# Patient Record
Sex: Female | Born: 1940 | Race: White | Hispanic: No | Marital: Married | Smoking: Never smoker
Health system: Southern US, Community
[De-identification: ages and names within clinical notes are randomized; demographics above are authoritative.]

## PROBLEM LIST (undated history)

## (undated) DIAGNOSIS — K5792 Diverticulitis of intestine, part unspecified, without perforation or abscess without bleeding: Secondary | ICD-10-CM

## (undated) DIAGNOSIS — K222 Esophageal obstruction: Secondary | ICD-10-CM

## (undated) DIAGNOSIS — G459 Transient cerebral ischemic attack, unspecified: Secondary | ICD-10-CM

## (undated) DIAGNOSIS — L0291 Cutaneous abscess, unspecified: Secondary | ICD-10-CM

## (undated) DIAGNOSIS — M26609 Unspecified temporomandibular joint disorder, unspecified side: Secondary | ICD-10-CM

## (undated) DIAGNOSIS — K56609 Unspecified intestinal obstruction, unspecified as to partial versus complete obstruction: Secondary | ICD-10-CM

## (undated) DIAGNOSIS — K2100 Gastro-esophageal reflux disease with esophagitis, without bleeding: Secondary | ICD-10-CM

## (undated) DIAGNOSIS — M5410 Radiculopathy, site unspecified: Secondary | ICD-10-CM

## (undated) DIAGNOSIS — N281 Cyst of kidney, acquired: Secondary | ICD-10-CM

## (undated) DIAGNOSIS — M359 Systemic involvement of connective tissue, unspecified: Secondary | ICD-10-CM

## (undated) DIAGNOSIS — I639 Cerebral infarction, unspecified: Secondary | ICD-10-CM

## (undated) DIAGNOSIS — M4802 Spinal stenosis, cervical region: Secondary | ICD-10-CM

## (undated) DIAGNOSIS — E785 Hyperlipidemia, unspecified: Secondary | ICD-10-CM

## (undated) DIAGNOSIS — Z66 Do not resuscitate: Secondary | ICD-10-CM

## (undated) DIAGNOSIS — I10 Essential (primary) hypertension: Secondary | ICD-10-CM

## (undated) DIAGNOSIS — I712 Thoracic aortic aneurysm, without rupture, unspecified: Secondary | ICD-10-CM

## (undated) DIAGNOSIS — Z973 Presence of spectacles and contact lenses: Secondary | ICD-10-CM

## (undated) DIAGNOSIS — I700 Atherosclerosis of aorta: Secondary | ICD-10-CM

## (undated) DIAGNOSIS — N321 Vesicointestinal fistula: Secondary | ICD-10-CM

## (undated) DIAGNOSIS — R911 Solitary pulmonary nodule: Secondary | ICD-10-CM

## (undated) DIAGNOSIS — Z8719 Personal history of other diseases of the digestive system: Secondary | ICD-10-CM

## (undated) DIAGNOSIS — R1310 Dysphagia, unspecified: Secondary | ICD-10-CM

## (undated) DIAGNOSIS — K219 Gastro-esophageal reflux disease without esophagitis: Secondary | ICD-10-CM

## (undated) DIAGNOSIS — M4800 Spinal stenosis, site unspecified: Secondary | ICD-10-CM

## (undated) DIAGNOSIS — M1990 Unspecified osteoarthritis, unspecified site: Secondary | ICD-10-CM

## (undated) DIAGNOSIS — T7840XA Allergy, unspecified, initial encounter: Secondary | ICD-10-CM

## (undated) HISTORY — DX: Solitary pulmonary nodule: R91.1

## (undated) HISTORY — PX: DILATION AND CURETTAGE OF UTERUS: SHX78

## (undated) HISTORY — DX: Unspecified temporomandibular joint disorder, unspecified side: M26.609

## (undated) HISTORY — DX: Do not resuscitate: Z66

## (undated) HISTORY — DX: Spinal stenosis, cervical region: M48.02

## (undated) HISTORY — DX: Gastro-esophageal reflux disease with esophagitis: K21.0

## (undated) HISTORY — PX: KNEE ARTHROSCOPY: SHX127

## (undated) HISTORY — DX: Atherosclerosis of aorta: I70.0

## (undated) HISTORY — DX: Allergy, unspecified, initial encounter: T78.40XA

## (undated) HISTORY — PX: CATARACT EXTRACTION: SUR2

## (undated) HISTORY — PX: SHOULDER SURGERY: SHX246

## (undated) HISTORY — PX: CARDIOVASCULAR STRESS TEST: SHX262

## (undated) HISTORY — DX: Gastro-esophageal reflux disease with esophagitis, without bleeding: K21.00

---

## 1999-10-22 ENCOUNTER — Other Ambulatory Visit: Admission: RE | Admit: 1999-10-22 | Discharge: 1999-10-22 | Payer: Self-pay | Admitting: Gynecology

## 1999-11-19 ENCOUNTER — Encounter (INDEPENDENT_AMBULATORY_CARE_PROVIDER_SITE_OTHER): Payer: Self-pay | Admitting: Specialist

## 1999-11-19 ENCOUNTER — Other Ambulatory Visit: Admission: RE | Admit: 1999-11-19 | Discharge: 1999-11-19 | Payer: Self-pay | Admitting: Gynecology

## 2001-03-01 ENCOUNTER — Other Ambulatory Visit: Admission: RE | Admit: 2001-03-01 | Discharge: 2001-03-01 | Payer: Self-pay | Admitting: Gynecology

## 2002-02-28 ENCOUNTER — Encounter: Payer: Self-pay | Admitting: Rheumatology

## 2002-02-28 ENCOUNTER — Encounter: Admission: RE | Admit: 2002-02-28 | Discharge: 2002-02-28 | Payer: Self-pay | Admitting: Rheumatology

## 2002-11-28 ENCOUNTER — Encounter: Payer: Self-pay | Admitting: Orthopedic Surgery

## 2002-11-28 ENCOUNTER — Ambulatory Visit (HOSPITAL_COMMUNITY): Admission: RE | Admit: 2002-11-28 | Discharge: 2002-11-28 | Payer: Self-pay | Admitting: Orthopedic Surgery

## 2003-02-07 ENCOUNTER — Inpatient Hospital Stay (HOSPITAL_COMMUNITY): Admission: RE | Admit: 2003-02-07 | Discharge: 2003-02-08 | Payer: Self-pay | Admitting: Orthopedic Surgery

## 2008-10-10 HISTORY — PX: ESOPHAGEAL DILATION: SHX303

## 2009-10-19 ENCOUNTER — Ambulatory Visit: Payer: Self-pay | Admitting: Vascular Surgery

## 2009-10-19 ENCOUNTER — Encounter (INDEPENDENT_AMBULATORY_CARE_PROVIDER_SITE_OTHER): Payer: Self-pay | Admitting: Orthopedic Surgery

## 2009-10-19 ENCOUNTER — Ambulatory Visit: Admission: RE | Admit: 2009-10-19 | Discharge: 2009-10-19 | Payer: Self-pay | Admitting: Orthopedic Surgery

## 2009-11-07 ENCOUNTER — Ambulatory Visit (HOSPITAL_COMMUNITY): Admission: RE | Admit: 2009-11-07 | Discharge: 2009-11-07 | Payer: Self-pay | Admitting: Orthopedic Surgery

## 2010-05-09 ENCOUNTER — Encounter
Admission: RE | Admit: 2010-05-09 | Discharge: 2010-05-09 | Payer: Self-pay | Source: Home / Self Care | Attending: Family Medicine | Admitting: Family Medicine

## 2010-07-16 ENCOUNTER — Ambulatory Visit (HOSPITAL_BASED_OUTPATIENT_CLINIC_OR_DEPARTMENT_OTHER)
Admission: RE | Admit: 2010-07-16 | Discharge: 2010-07-16 | Disposition: A | Discharge status: Home / Self Care | Payer: MEDICARE | Source: Ambulatory Visit | Attending: Orthopedic Surgery | Admitting: Orthopedic Surgery

## 2010-07-16 DIAGNOSIS — Z79899 Other long term (current) drug therapy: Secondary | ICD-10-CM | POA: Insufficient documentation

## 2010-07-16 DIAGNOSIS — M659 Unspecified synovitis and tenosynovitis, unspecified site: Secondary | ICD-10-CM | POA: Insufficient documentation

## 2010-07-16 DIAGNOSIS — X58XXXA Exposure to other specified factors, initial encounter: Secondary | ICD-10-CM | POA: Insufficient documentation

## 2010-07-16 DIAGNOSIS — R0789 Other chest pain: Secondary | ICD-10-CM | POA: Insufficient documentation

## 2010-07-16 DIAGNOSIS — M1710 Unilateral primary osteoarthritis, unspecified knee: Secondary | ICD-10-CM | POA: Insufficient documentation

## 2010-07-16 DIAGNOSIS — IMO0002 Reserved for concepts with insufficient information to code with codable children: Secondary | ICD-10-CM | POA: Insufficient documentation

## 2010-07-16 DIAGNOSIS — Z8673 Personal history of transient ischemic attack (TIA), and cerebral infarction without residual deficits: Secondary | ICD-10-CM | POA: Insufficient documentation

## 2010-07-16 DIAGNOSIS — Z7982 Long term (current) use of aspirin: Secondary | ICD-10-CM | POA: Insufficient documentation

## 2010-07-17 NOTE — Op Note (Signed)
NAME:  Yvonne Espinoza, Yvonne Espinoza NO.:  [id number]  MEDICAL RECORD NO.:  [id number]            PATIENT TYPE:  LOCATION:                                 FACILITY:  PHYSICIAN:  Georges Lynch. Debbera Wolken, M.D.DATE OF BIRTH:  1941/03/10  DATE OF PROCEDURE:  07/16/2010 DATE OF DISCHARGE:                              OPERATIVE REPORT   SURGEON:  Georges Lynch. Darrelyn Hillock, M.D.  ASSISTANT:  Nurse.  PREOPERATIVE DIAGNOSES: 1. Torn medial meniscus right knee. 2. Degenerative arthritis right knee.  POSTOPERATIVE DIAGNOSES: 1. Torn medial meniscus right knee. 2. Degenerative arthritis right knee.  OPERATIONS: 1. Diagnostic arthroscopy, right knee. 2. Medial meniscectomy, right knee. 3. Abrasion chondroplasty medial femoral condyle, right knee. 4. Abrasion chondroplasty lateral femoral condyle, right knee. 5. Synovectomy suprapatellar pouch, right knee.  DESCRIPTION OF PROCEDURE:  Under general anesthesia, routine orthopedic prep and draping of the right lower extremity was carried out.  She had 1 g IV Ancef.  The appropriate time-out was carried out prior to making the incisions.  Prior to her coming into the operating room in the holding area, marked the appropriate right leg.  After the sterile prep and draping in the operating room, I did a small punctate incision in suprapatellar pouch.  Inflow cannula was inserted.  Knee was distended with saline.  Another small punctate incision made in the lateral joint. The arthroscope was entered from lateral approach, and a complete diagnostic arthroscopy was carried out.  Following that, I went up in the suprapatellar pouch.  She had a severe synovitis.  The patellofemoral joint looked fine.  I introduced the ArthroCare and did a synovectomy.  I then went down in the lateral joint.  The lateral meniscus had some very small peripheral tears.  No meniscectomy was necessary.  Did have a chondral defect with a small piece of cartilage literally  hanging off the condyle, simply did an abrasion chondroplasty not down to bleeding bone.  Cruciates were intact.  I went over the medial joint where the highlighted main problem was.  She had a complex tear of medial meniscus.  I did a partial medial meniscectomy.  There were no other abnormalities of the meniscus.  I then did an abrasion chondroplasty in the medial femoral condyle because of the arthritic changes.  No microfracture technique was necessary.  I thoroughly irrigated out the knee, removed all the fluid, closed all 3 punctate incisions with 3-0 nylon suture.  I injected 30 mL of 0.25% Marcaine with epinephrine in the knee joint, and a sterile dressing was applied. No Neosporin was used since she is allergic NEOSPORIN.  POSTOPERATIVE INSTRUCTIONS: 1. She will be on aspirin 325 mg b.i.d. for 2 weeks starting today the     day of surgery. 2. She will be on a walker partial to full weightbearing as tolerated. 3. She will be on Percocet 10/650 one every 4 hours p.r.n. for pain. 4. I will see her in 10-12 days in the office for a followup or before     that if presenting problem.          ______________________________ Georges Lynch Darrelyn Hillock, M.D.  RAG/MEDQ  D:  07/16/2010  T:  07/16/2010  Job:  [id number]  Electronically Signed by Ranee Gosselin M.D. on 07/17/2010 08:29:55 AM

## 2010-07-18 LAB — POCT HEMOGLOBIN-HEMACUE: Hemoglobin: 15.7 g/dL — ABNORMAL HIGH (ref 12.0–15.0)

## 2010-07-21 LAB — COMPREHENSIVE METABOLIC PANEL
ALT: 22 U/L (ref 0–35)
AST: 20 U/L (ref 0–37)
Albumin: 3.5 g/dL (ref 3.5–5.2)
Alkaline Phosphatase: 83 U/L (ref 39–117)
Chloride: 106 mEq/L (ref 96–112)
GFR calc Af Amer: >60 mL/min (ref >60)
Potassium: 4.7 mEq/L (ref 3.5–5.1)
Total Bilirubin: 0.7 mg/dL (ref 0.3–1.2)

## 2010-07-21 LAB — URINALYSIS, ROUTINE W REFLEX MICROSCOPIC
Bilirubin Urine: NEGATIVE
Glucose, UA: NEGATIVE mg/dL
Ketones, ur: NEGATIVE mg/dL
pH: 5.5 (ref 5.0–8.0)

## 2010-07-21 LAB — CBC
HCT: 42.8 % (ref 36.0–46.0)
Hemoglobin: 14.7 g/dL (ref 12.0–15.0)
MCH: 31 pg (ref 26.0–34.0)
MCHC: 34.4 g/dL (ref 30.0–36.0)
MCV: 90.2 fL (ref 78.0–100.0)
Platelets: 316 10*3/uL (ref 150–400)
RBC: 4.74 MIL/uL (ref 3.87–5.11)
RDW: 14.8 % (ref 11.5–15.5)
WBC: 8.5 10*3/uL (ref 4.0–10.5)

## 2010-07-21 LAB — DIFFERENTIAL
Basophils Absolute: 0 10*3/uL (ref 0.0–0.1)
Basophils Relative: 0 % (ref 0–1)
Eosinophils Relative: 3 % (ref 0–5)
Monocytes Absolute: 0.7 10*3/uL (ref 0.1–1.0)

## 2010-09-20 NOTE — Op Note (Signed)
   NAME:  Yvonne Espinoza, Yvonne Espinoza                      ACCOUNT NO.:  [id number]   MEDICAL RECORD NO.:  [id number]                   PATIENT TYPE:  OBV   LOCATION:  0446                                 FACILITY:  [facility]   PHYSICIAN:  Georges Lynch. Darrelyn Hillock, M.D.             DATE OF BIRTH:  09-20-1940   DATE OF PROCEDURE:  02/06/2003  DATE OF DISCHARGE:                                 OPERATIVE REPORT   PREOPERATIVE DIAGNOSES:  1. Severe impingement syndrome, right shoulder.  2. Partial tear of rotator cuff tendon, right shoulder.   POSTOPERATIVE DIAGNOSES:  1. Severe impingement syndrome, right shoulder.  2. Partial tear of rotator cuff tendon, right shoulder.   OPERATION:  1. Partial acromionectomy with acromioplasty, right shoulder.  2. Repair of a partial tear of the rotator cuff utilizing some direct     suturing.  3. Excision of the subdeltoid bursa, right shoulder.   SURGEON:  Georges Lynch. Darrelyn Hillock, M.D.   ASSISTANT:  Ebbie Ridge. Paitsel, P.A.   DESCRIPTION OF PROCEDURE:  Under general anesthesia, the patient first had 1  g of IV Ancef.  She also had an interscalene block prior to her general  anesthesia on the right.  Sterile prep and draping was carried out in the  operating room. She had 1 g of IV Ancef.  The incision was made over the  anterior aspect of the right shoulder, bleeders identified and cauterized.  The incision was carried down to the acromion.  By sharp dissection I  separated the tendon from the acromion.  Following this I went down and  partially split the proximal portion of the deltoid muscle.  I inserted the  Bennett retractor and did a partial acromionectomy and an acromioplasty  utilizing an oscillating saw and the bur.  She had severe impingement.  She  had literally caused a marked thinning of her rotator cuff with a small hole  in the cuff.  I used a couple of direct sutures to suture that.  The  subdeltoid bursa was removed.  It was full of fluid.  Thoroughly  irrigated  out the area.  I bone-waxed the raw end of the acromion and then inserted  some Gelfoam.  The wound then was closed in layers in the usual fashion.  The skin was closed with metal staples.  A sterile Neosporin dressing was  applied.  She was placed in a shoulder immobilizer.                                                Ronald A. Darrelyn Hillock, M.D.    RAG/MEDQ  D:  02/06/2003  T:  02/06/2003  Job:  [id number]

## 2011-08-20 ENCOUNTER — Other Ambulatory Visit: Payer: Self-pay | Admitting: Family Medicine

## 2011-08-22 ENCOUNTER — Ambulatory Visit
Admission: RE | Admit: 2011-08-22 | Discharge: 2011-08-22 | Disposition: A | Discharge status: Home / Self Care | Payer: Medicare Other | Source: Ambulatory Visit | Attending: Family Medicine | Admitting: Family Medicine

## 2012-09-02 DIAGNOSIS — M26609 Unspecified temporomandibular joint disorder, unspecified side: Secondary | ICD-10-CM

## 2012-09-02 HISTORY — DX: Unspecified temporomandibular joint disorder, unspecified side: M26.609

## 2014-04-20 ENCOUNTER — Other Ambulatory Visit: Payer: Self-pay | Admitting: Family Medicine

## 2014-04-20 DIAGNOSIS — R509 Fever, unspecified: Secondary | ICD-10-CM

## 2014-04-20 DIAGNOSIS — R1084 Generalized abdominal pain: Secondary | ICD-10-CM

## 2014-04-24 ENCOUNTER — Ambulatory Visit
Admission: RE | Admit: 2014-04-24 | Discharge: 2014-04-24 | Disposition: A | Discharge status: Home / Self Care | Payer: Commercial Managed Care - HMO | Source: Ambulatory Visit | Attending: Family Medicine | Admitting: Family Medicine

## 2014-04-24 DIAGNOSIS — R509 Fever, unspecified: Secondary | ICD-10-CM

## 2014-04-24 DIAGNOSIS — R1084 Generalized abdominal pain: Secondary | ICD-10-CM

## 2014-04-24 MED ORDER — IOHEXOL 300 MG/ML  SOLN
100.0000 mL | Freq: Once | INTRAMUSCULAR | Status: AC | PRN
  Administered 2014-04-24: 100 mL via INTRAVENOUS

## 2014-05-01 ENCOUNTER — Encounter (HOSPITAL_COMMUNITY): Payer: Self-pay | Admitting: *Deleted

## 2014-05-01 ENCOUNTER — Inpatient Hospital Stay (HOSPITAL_COMMUNITY)
Admission: EM | Admit: 2014-05-01 | Discharge: 2014-05-03 | DRG: 392 | Disposition: A | Discharge status: Home / Self Care | Payer: Commercial Managed Care - HMO | Attending: Internal Medicine | Admitting: Internal Medicine

## 2014-05-01 ENCOUNTER — Emergency Department (HOSPITAL_COMMUNITY): Payer: Commercial Managed Care - HMO

## 2014-05-01 DIAGNOSIS — M069 Rheumatoid arthritis, unspecified: Secondary | ICD-10-CM | POA: Diagnosis present

## 2014-05-01 DIAGNOSIS — R109 Unspecified abdominal pain: Secondary | ICD-10-CM | POA: Diagnosis not present

## 2014-05-01 DIAGNOSIS — Z886 Allergy status to analgesic agent status: Secondary | ICD-10-CM | POA: Diagnosis not present

## 2014-05-01 DIAGNOSIS — M62838 Other muscle spasm: Secondary | ICD-10-CM | POA: Diagnosis present

## 2014-05-01 DIAGNOSIS — Z8719 Personal history of other diseases of the digestive system: Secondary | ICD-10-CM

## 2014-05-01 DIAGNOSIS — D649 Anemia, unspecified: Secondary | ICD-10-CM | POA: Diagnosis present

## 2014-05-01 DIAGNOSIS — Z8673 Personal history of transient ischemic attack (TIA), and cerebral infarction without residual deficits: Secondary | ICD-10-CM

## 2014-05-01 DIAGNOSIS — Z888 Allergy status to other drugs, medicaments and biological substances status: Secondary | ICD-10-CM

## 2014-05-01 DIAGNOSIS — M4800 Spinal stenosis, site unspecified: Secondary | ICD-10-CM | POA: Diagnosis present

## 2014-05-01 DIAGNOSIS — K5732 Diverticulitis of large intestine without perforation or abscess without bleeding: Secondary | ICD-10-CM | POA: Diagnosis not present

## 2014-05-01 DIAGNOSIS — K5733 Diverticulitis of large intestine without perforation or abscess with bleeding: Secondary | ICD-10-CM

## 2014-05-01 HISTORY — DX: Spinal stenosis, site unspecified: M48.00

## 2014-05-01 HISTORY — DX: Transient cerebral ischemic attack, unspecified: G45.9

## 2014-05-01 HISTORY — DX: Unspecified osteoarthritis, unspecified site: M19.90

## 2014-05-01 HISTORY — DX: Diverticulitis of intestine, part unspecified, without perforation or abscess without bleeding: K57.92

## 2014-05-01 LAB — COMPREHENSIVE METABOLIC PANEL
ALBUMIN: 3.6 g/dL (ref 3.5–5.2)
ALK PHOS: 68 U/L (ref 39–117)
ALT: 12 U/L (ref 0–35)
ALT: 13 U/L (ref 0–35)
ANION GAP: 9 (ref 5–15)
AST: 19 U/L (ref 0–37)
AST: 19 U/L (ref 0–37)
Albumin: 3.1 g/dL — ABNORMAL LOW (ref 3.5–5.2)
Alkaline Phosphatase: 75 U/L (ref 39–117)
Anion gap: 10 (ref 5–15)
BUN: 10 mg/dL (ref 6–23)
BUN: 8 mg/dL (ref 6–23)
CALCIUM: 9 mg/dL (ref 8.4–10.5)
CO2: 26 mmol/L (ref 19–32)
CO2: 28 mmol/L (ref 19–32)
CREATININE: 0.69 mg/dL (ref 0.50–1.10)
Calcium: 9.1 mg/dL (ref 8.4–10.5)
Chloride: 101 mEq/L (ref 96–112)
Chloride: 100 mEq/L (ref 96–112)
Creatinine, Ser: 0.7 mg/dL (ref 0.50–1.10)
GFR calc Af Amer: >90 mL/min (ref >90)
GFR calc Af Amer: >90 mL/min (ref >90)
GFR calc non Af Amer: 85 mL/min — ABNORMAL LOW (ref >90)
GFR calc non Af Amer: 85 mL/min — ABNORMAL LOW (ref >90)
Glucose, Bld: 110 mg/dL — ABNORMAL HIGH (ref 70–99)
Glucose, Bld: 166 mg/dL — ABNORMAL HIGH (ref 70–99)
POTASSIUM: 4 mmol/L (ref 3.5–5.1)
Potassium: 4 mmol/L (ref 3.5–5.1)
Sodium: 136 mmol/L (ref 135–145)
Sodium: 138 mmol/L (ref 135–145)
TOTAL PROTEIN: 7.3 g/dL (ref 6.0–8.3)
TOTAL PROTEIN: 6.5 g/dL (ref 6.0–8.3)
Total Bilirubin: 0.4 mg/dL (ref 0.3–1.2)
Total Bilirubin: 0.6 mg/dL (ref 0.3–1.2)

## 2014-05-01 LAB — URINE MICROSCOPIC-ADD ON

## 2014-05-01 LAB — URINALYSIS, ROUTINE W REFLEX MICROSCOPIC
Glucose, UA: NEGATIVE mg/dL
Hgb urine dipstick: NEGATIVE
Ketones, ur: NEGATIVE mg/dL
NITRITE: NEGATIVE
Protein, ur: NEGATIVE mg/dL
SPECIFIC GRAVITY, URINE: 1.022 (ref 1.005–1.030)
UROBILINOGEN UA: 0.2 mg/dL (ref 0.0–1.0)
pH: 5 (ref 5.0–8.0)

## 2014-05-01 LAB — CBC WITH DIFFERENTIAL/PLATELET
BASOS ABS: 0.1 10*3/uL (ref 0.0–0.1)
BASOS PCT: 0 % (ref 0–1)
EOS ABS: 0.4 10*3/uL (ref 0.0–0.7)
Eosinophils Relative: 2 % (ref 0–5)
HEMATOCRIT: 35.6 % — AB (ref 36.0–46.0)
HEMOGLOBIN: 11.1 g/dL — AB (ref 12.0–15.0)
Lymphocytes Relative: 9 % — ABNORMAL LOW (ref 12–46)
Lymphs Abs: 1.4 10*3/uL (ref 0.7–4.0)
MCH: 28.1 pg (ref 26.0–34.0)
MCHC: 31.2 g/dL (ref 30.0–36.0)
MCV: 90.1 fL (ref 78.0–100.0)
MONO ABS: 1 10*3/uL (ref 0.1–1.0)
MONOS PCT: 7 % (ref 3–12)
NEUTROS PCT: 82 % — AB (ref 43–77)
Neutro Abs: 11.7 10*3/uL — ABNORMAL HIGH (ref 1.7–7.7)
Platelets: 712 10*3/uL — ABNORMAL HIGH (ref 150–400)
RBC: 3.95 MIL/uL (ref 3.87–5.11)
RDW: 18.7 % — AB (ref 11.5–15.5)
WBC: 14.5 10*3/uL — ABNORMAL HIGH (ref 4.0–10.5)

## 2014-05-01 LAB — TSH: TSH: 1.87 u[IU]/mL (ref 0.350–4.500)

## 2014-05-01 LAB — LIPASE, BLOOD: LIPASE: 19 U/L (ref 11–59)

## 2014-05-01 MED ORDER — ONDANSETRON HCL 4 MG PO TABS: 4.0000 mg | ORAL_TABLET | Freq: Four times a day (QID) | ORAL | Status: DC | PRN

## 2014-05-01 MED ORDER — IOHEXOL 300 MG/ML  SOLN
50.0000 mL | Freq: Once | INTRAMUSCULAR | Status: AC | PRN
  Administered 2014-05-01: 50 mL via ORAL

## 2014-05-01 MED ORDER — HYDROCODONE-ACETAMINOPHEN 5-325 MG PO TABS: 1.0000 | ORAL_TABLET | ORAL | Status: DC | PRN

## 2014-05-01 MED ORDER — MORPHINE SULFATE 2 MG/ML IJ SOLN: 2.0000 mg | INTRAMUSCULAR | Status: DC | PRN

## 2014-05-01 MED ORDER — FOLIC ACID 1 MG PO TABS: 1.0000 mg | ORAL_TABLET | Freq: Three times a day (TID) | ORAL | Status: DC

## 2014-05-01 MED ORDER — SODIUM CHLORIDE 0.9 % IV SOLN
3.0000 g | Freq: Four times a day (QID) | INTRAVENOUS | Status: DC
  Administered 2014-05-01 – 2014-05-03 (×6): 3 g via INTRAVENOUS
  Filled 2014-05-01 (×7): qty 3

## 2014-05-01 MED ORDER — ZOLPIDEM TARTRATE 5 MG PO TABS: 5.0000 mg | ORAL_TABLET | Freq: Every evening | ORAL | Status: DC | PRN

## 2014-05-01 MED ORDER — ADULT MULTIVITAMIN W/MINERALS CH
1.0000 | ORAL_TABLET | Freq: Every day | ORAL | Status: DC
  Filled 2014-05-01 (×2): qty 1

## 2014-05-01 MED ORDER — METHOTREXATE SODIUM CHEMO INJECTION 25 MG/ML: 20.0000 mg | INTRAMUSCULAR | Status: DC

## 2014-05-01 MED ORDER — IOHEXOL 300 MG/ML  SOLN
100.0000 mL | Freq: Once | INTRAMUSCULAR | Status: AC | PRN
  Administered 2014-05-01: 100 mL via INTRAVENOUS

## 2014-05-01 MED ORDER — FOLIC ACID 1 MG PO TABS
1.0000 mg | ORAL_TABLET | Freq: Three times a day (TID) | ORAL | Status: DC
  Administered 2014-05-01 – 2014-05-03 (×5): 1 mg via ORAL
  Filled 2014-05-01 (×9): qty 1

## 2014-05-01 MED ORDER — PANTOPRAZOLE SODIUM 40 MG PO TBEC
40.0000 mg | DELAYED_RELEASE_TABLET | Freq: Every day | ORAL | Status: DC
  Filled 2014-05-01 (×2): qty 1

## 2014-05-01 MED ORDER — GARLIC 1000 MG PO CAPS: 1000.0000 mg | ORAL_CAPSULE | Freq: Every day | ORAL | Status: DC

## 2014-05-01 MED ORDER — VITAMIN B-1 100 MG PO TABS
100.0000 mg | ORAL_TABLET | Freq: Every day | ORAL | Status: DC
  Filled 2014-05-01 (×2): qty 1

## 2014-05-01 MED ORDER — ONDANSETRON HCL 4 MG/2ML IJ SOLN: 4.0000 mg | Freq: Four times a day (QID) | INTRAMUSCULAR | Status: DC | PRN

## 2014-05-01 MED ORDER — METHOTREXATE (PF) 10 MG/0.4ML ~~LOC~~ SOAJ: 0.8000 mL | SUBCUTANEOUS | Status: DC

## 2014-05-01 MED ORDER — SODIUM CHLORIDE 0.9 % IV SOLN
INTRAVENOUS | Status: DC
  Administered 2014-05-01 – 2014-05-03 (×2): via INTRAVENOUS

## 2014-05-01 MED ORDER — SODIUM CHLORIDE 0.9 % IV SOLN
3.0000 g | Freq: Four times a day (QID) | INTRAVENOUS | Status: DC
  Filled 2014-05-01 (×2): qty 3

## 2014-05-01 NOTE — Progress Notes (Signed)
Utilization Review completed.  Ronniesha Seibold RN CM  

## 2014-05-01 NOTE — Progress Notes (Signed)
PHARMACIST - PHYSICIAN ORDER COMMUNICATION  CONCERNING: P&T Medication Policy on Herbal Medications  DESCRIPTION:  This patient's order for:  garlic  has been noted.  This product(s) is classified as an "herbal" or natural product. Due to a lack of definitive safety studies or FDA approval, nonstandard manufacturing practices, plus the potential risk of unknown drug-drug interactions while on inpatient medications, the Pharmacy and Therapeutics Committee does not permit the use of "herbal" or natural products of this type within [facility].   ACTION TAKEN: The pharmacy department is unable to verify this order at this time and your patient has been informed of this safety policy. Please reevaluate patient's clinical condition at discharge and address if the herbal or natural product(s) should be resumed at that time.  Netta Cedars, PharmD, BCPS [phone]

## 2014-05-01 NOTE — ED Notes (Signed)
Patient has finished her CT contrast

## 2014-05-01 NOTE — ED Provider Notes (Signed)
CSN: [id number]     Arrival date & time 05/01/14  1238 History   First MD Initiated Contact with Patient 05/01/14 1501     Chief Complaint  Patient presents with  . diverticulitis   . Abdominal Pain     (Consider location/radiation/quality/duration/timing/severity/associated sxs/prior Treatment) Patient is a 73 y.o. female presenting with abdominal pain. The history is provided by the patient.  Abdominal Pain Pain location:  LLQ and RLQ Pain quality: aching and sharp   Pain radiates to:  Does not radiate Pain severity:  Moderate Onset quality:  Gradual Timing:  Intermittent Progression:  Unchanged Chronicity:  New Context: recent illness (recent diverticulitis)   Context: not eating and not recent travel   Relieved by:  Nothing Worsened by:  Nothing tried Ineffective treatments:  None tried Associated symptoms: chills, diarrhea (with mucus in her stools) and fever   Associated symptoms: no cough, no nausea, no shortness of breath and no vomiting     Past Medical History  Diagnosis Date  . TIA (transient ischemic attack)     2209,2010  . Spinal stenosis   . Diverticulitis    Past Surgical History  Procedure Laterality Date  . Shoulder surgery      right 2010   History reviewed. No pertinent family history. History  Substance Use Topics  . Smoking status: Never Smoker   . Smokeless tobacco: Not on file  . Alcohol Use: No   OB History    No data available     Review of Systems  Constitutional: Positive for fever and chills.  Respiratory: Negative for cough and shortness of breath.   Gastrointestinal: Positive for abdominal pain and diarrhea (with mucus in her stools). Negative for nausea and vomiting.  All other systems reviewed and are negative.     Allergies  Neosporin  Home Medications   Prior to Admission medications   Medication Sig Start Date End Date Taking? Authorizing Provider  BIOTIN PO Take 10,000 mg by mouth daily.   Yes Historical  Provider, MD  ciprofloxacin (CIPRO) 500 MG tablet Take 500 mg by mouth 2 (two) times daily.   Yes Historical Provider, MD  folic acid (FOLVITE) 1 MG tablet Take 1 mg by mouth 3 (three) times daily.   Yes Historical Provider, MD  Garlic 1000 MG CAPS Take 1,000 mg by mouth daily.   Yes Historical Provider, MD  Methotrexate, Anti-Rheumatic, (METHOTREXATE, PF, Avis) Inject 0.8 mLs into the skin once a week. Take injection Every Friday.   Yes Historical Provider, MD  metroNIDAZOLE (FLAGYL) 500 MG tablet Take 500 mg by mouth 2 (two) times daily.   Yes Historical Provider, MD  Multiple Vitamins-Minerals (WOMENS MULTIVITAMIN PLUS PO) Take 1 tablet by mouth daily.   Yes Historical Provider, MD  omeprazole (PRILOSEC) 20 MG capsule Take 20 mg by mouth daily.   Yes Historical Provider, MD  OVER THE COUNTER MEDICATION Take 1 capsule by mouth daily. Co Q-10 & Cinnamon [id number] mg capsule.   Yes Historical Provider, MD  OVER THE COUNTER MEDICATION Take 1 capsule by mouth daily. Lutigold 20 mg capsule.   Yes Historical Provider, MD   BP 122/49 mmHg  Pulse 103  Temp(Src) 97.7 F (36.5 C) (Oral)  Resp 16  SpO2 100% Physical Exam  Constitutional: She is oriented to person, place, and time. She appears well-developed and well-nourished. No distress.  HENT:  Head: Normocephalic and atraumatic.  Mouth/Throat: Oropharynx is clear and moist. No oropharyngeal exudate.  Eyes: EOM are normal. Pupils are  equal, round, and reactive to light.  Neck: Normal range of motion. Neck supple.  Cardiovascular: Normal rate and regular rhythm.  Exam reveals no friction rub.   No murmur heard. Pulmonary/Chest: Effort normal and breath sounds normal. No respiratory distress. She has no wheezes. She has no rales.  Abdominal: Soft. She exhibits no distension. There is tenderness (bilateral lower quadrants). There is guarding (bilateral lower quadrants). There is no rebound.  Musculoskeletal: Normal range of motion. She exhibits no  edema.  Neurological: She is alert and oriented to person, place, and time.  Skin: No rash noted. She is not diaphoretic.  Nursing note and vitals reviewed.   ED Course  Procedures (including critical care time) Labs Review Labs Reviewed  CBC WITH DIFFERENTIAL - Abnormal; Notable for the following:    WBC 14.5 (*)    Hemoglobin 11.1 (*)    HCT 35.6 (*)    RDW 18.7 (*)    Platelets 712 (*)    Neutrophils Relative % 82 (*)    Neutro Abs 11.7 (*)    Lymphocytes Relative 9 (*)    All other components within normal limits  COMPREHENSIVE METABOLIC PANEL - Abnormal; Notable for the following:    Glucose, Bld 110 (*)    GFR calc non Af Amer 85 (*)    All other components within normal limits  URINALYSIS, ROUTINE W REFLEX MICROSCOPIC - Abnormal; Notable for the following:    Color, Urine AMBER (*)    Bilirubin Urine SMALL (*)    Leukocytes, UA SMALL (*)    All other components within normal limits  URINE MICROSCOPIC-ADD ON - Abnormal; Notable for the following:    Bacteria, UA FEW (*)    All other components within normal limits  LIPASE, BLOOD    Imaging Review Ct Abdomen Pelvis W Contrast  05/01/2014   CLINICAL DATA:  Diffuse abdominal pain for 1 year. Follow-up diverticulitis with microperforation. Persistent fevers and chills.  EXAM: CT ABDOMEN AND PELVIS WITH CONTRAST  TECHNIQUE: Multidetector CT imaging of the abdomen and pelvis was performed using the standard protocol following bolus administration of intravenous contrast.  CONTRAST:  OMNIPAQUE IOHEXOL 300 MG/ML  SOLN  COMPARISON:  04/24/2014  FINDINGS: Lower Chest:  Unremarkable.  Hepatobiliary: Stable tiny left hepatic lobe cyst. No masses or other significant abnormality identified.  Pancreas: No mass, inflammatory changes, or other parenchymal abnormality identified.  Spleen:  Within normal limits in size and appearance.  Adrenal Glands:  No mass identified.  Kidneys/Urinary Tract: No masses identified. Stable bilateral  parapelvic cysts. No evidence of hydronephrosis.  Stomach/Bowel/Peritoneum: Moderate to severe diverticulitis involving the sigmoid colon is again seen without significant change. Inflammatory changes are again seen in the pericolonic fat and central sigmoid mesocolon, however no definite extraluminal air bubbles are seen on today's study. There is no evidence of abscess or free fluid. No evidence of bowel obstruction.  Vascular/Lymphatic: No pathologically enlarged lymph nodes identified. No other significant abnormality identified.  Reproductive:  No mass or other significant abnormality identified.  Other:  None.  Musculoskeletal:  No suspicious bone lesions identified.  IMPRESSION: Persistent moderate to severe sigmoid diverticulitis. No extraluminal air, abscess, or other complication identified on today's exam.   Electronically Signed   By: Myles Rosenthal M.D.   On: 05/01/2014 17:36     EKG Interpretation None      MDM   Final diagnoses:  Abdominal pain  Diverticulitis of large intestine without perforation or abscess without bleeding  8F here with fever, chills. Seen at [facility] today, sent for further evaluation. Recently diagnosed with diverticulitis, hx of same over past 1.5 years. Has been taking Cipro/Flagyl. Despite antibiotics, persistent fevers, intermittent abdominal pain. Also having mucus in her stools and some diarrhea.  Last fever 2 days ago.  Records show WBC of 12 and CT imaging states moderate diverticulitis with microperforation from 04/22/14. Here AFVSS. Lower abodminal pain with guarding. Plan for repeat CT scan. Concern for possible abscess. CT shows persistent diverticulitis. Since failing outpatient therapy, admitted for antibiotics.   Elwin Mocha, MD 05/02/14 [phone]

## 2014-05-01 NOTE — ED Notes (Signed)
Pt sent from pcp, pt has had ongoing abdominal pain/ issues x1 year. Had colonoscopy 3 weeks ago with no results. Had CT scan on 12/21 which shows "recent diverticulitis with microperforation". Ongoing fevers. Abdominal pain 6/10. pcp was trying to get pt into with a surgeon, surgeon told pt to come to ED if she was in a lot of pain. Pt reports she is not in a lot of pain, her biggest concern is why she keeps having fevers and chills.

## 2014-05-01 NOTE — Progress Notes (Signed)
ANTIBIOTIC CONSULT NOTE - INITIAL  Pharmacy Consult for Unasyn Indication: Diverticulitis  Allergies  Allergen Reactions  . Gabapentin Other (See Comments)    "every side effect listed on pamphlet"  . Neosporin [Neomycin-Bacitracin Zn-Polymyx]   . Prednisone Other (See Comments)    Tachycardia/mood swings    Patient Measurements: Height: 5' (152.4 cm) Weight: 119 lb 11.4 oz (54.3 kg) IBW/kg (Calculated) : 45.5 Adjusted Body Weight:   Vital Signs: Temp: 98.7 F (37.1 C) (12/28 2002) Temp Source: Oral (12/28 2002) BP: 129/49 mmHg (12/28 2002) Pulse Rate: 89 (12/28 2002) Intake/Output from previous day:   Intake/Output from this shift:    Labs:  Recent Labs  05/01/14 1324  WBC 14.5*  HGB 11.1*  PLT 712*  CREATININE 0.69   Estimated Creatinine Clearance: 45.7 mL/min (by C-G formula based on Cr of 0.69). No results for input(s): VANCOTROUGH, VANCOPEAK, VANCORANDOM, GENTTROUGH, GENTPEAK, GENTRANDOM, TOBRATROUGH, TOBRAPEAK, TOBRARND, AMIKACINPEAK, AMIKACINTROU, AMIKACIN in the last 72 hours.   Microbiology: No results found for this or any previous visit (from the past 720 hour(s)).  Medical History: Past Medical History  Diagnosis Date  . TIA (transient ischemic attack)     2209,2010  . Spinal stenosis   . Diverticulitis   . Arthritis     Assessment: 20 yoF presents with abdominal pain, fever, and chills, recently CT 12/19 showing moderate diverticulitis with microperforation. Pharmacy consulted to start Unasyn for possible abscess / diverticulitis.  Plan for repeat CT scan.    12/28 >> unasyn  >>  Tmax: AF WBCs: Elevated 14.5K Renal: SCr 0.69, CrCl 46 ml/min  Goal of Therapy:  Eradication of infection  Plan:  Start unasyn 3g IV q6h.  F/u renal fxn, clinical course  Haynes Hoehn, PharmD, BCPS 05/01/2014, 9:13 PM  Pager: [phone]

## 2014-05-01 NOTE — H&P (Signed)
[facility] History and Physical  Niyonna Betsill Mcgilvray URK:[id number] DOB: [birth year] DOA: 05/01/2014  Referring physician: Elwin Mocha, MD PCP: Lupe Carney, MD   Chief Complaint: Abdominal pain  HPI: Yvonne Espinoza is a 73 y.o. female presents with diverticulitis. Patient was sent over from [facility] for evaluation. She states that she went to her PCP today for a recheck. Patient had been on antibiotics for a diverticulitis a week ago. She was on Cipro and Flagyl. She states that she had been having diarrhea in the mornings. Patient states that there was some blood noted in the diarrhea. She states also associated was abdominal pain in the lower quadrants. She states that she is not on iron. She states that she had been having no vomiting but did have some nausea. She had noted fevers and chills also. She states that she has been having shaking and has had a fever as high as 102.85F. She has had night sweats also. Patient has no headaches noted. She states that she has not had any chest pain. She has noted some muscle spasm on occasion.   Review of Systems:  Constitutional:  ++weight loss 24 pounds, ++night sweats, ++Fevers, ++chills, ++fatigue.  HEENT:  No headaches, itching, ear ache, nasal congestion, post nasal drip,  Cardio-vascular:  No chest pain, Orthopnea, PND, swelling in lower extremities no dizziness  GI:  No heartburn, indigestion, ++abdominal pain, ++nausea, no vomiting, ++diarrhea, ++change in bowel habits, ++loss of appetite  Resp:  No shortness of breath with exertion or at rest. No excess mucus, no productive cough, No non-productive cough, No coughing up of blood Skin:  no rash or lesions GU:  no dysuria, change in color of urine, no urgency or frequency. No flank pain.  Musculoskeletal:  ++joint pain. no back pain.  Psych:  No change in mood or affect. No depression or anxiety   Past Medical History  Diagnosis Date  . TIA (transient ischemic  attack)     2209,2010  . Spinal stenosis   . Diverticulitis    Past Surgical History  Procedure Laterality Date  . Shoulder surgery      right 2010   Social History:  reports that she has never smoked. She has never used smokeless tobacco. She reports that she drinks alcohol. She reports that she does not use illicit drugs.  Allergies  Allergen Reactions  . Gabapentin Other (See Comments)    "every side effect listed on pamphlet"  . Neosporin [Neomycin-Bacitracin Zn-Polymyx]   . Prednisone Other (See Comments)    Tachycardia/mood swings    History reviewed. No pertinent family history.   Prior to Admission medications   Medication Sig Start Date End Date Taking? Authorizing Provider  BIOTIN PO Take 10,000 mg by mouth daily.   Yes Historical Provider, MD  ciprofloxacin (CIPRO) 500 MG tablet Take 500 mg by mouth 2 (two) times daily.   Yes Historical Provider, MD  folic acid (FOLVITE) 1 MG tablet Take 1 mg by mouth 3 (three) times daily.   Yes Historical Provider, MD  Garlic 1000 MG CAPS Take 1,000 mg by mouth daily.   Yes Historical Provider, MD  Methotrexate, Anti-Rheumatic, (METHOTREXATE, PF, [place]) Inject 0.8 mLs into the skin once a week. Take injection Every Friday.   Yes Historical Provider, MD  metroNIDAZOLE (FLAGYL) 500 MG tablet Take 500 mg by mouth 2 (two) times daily.   Yes Historical Provider, MD  Multiple Vitamins-Minerals (WOMENS MULTIVITAMIN PLUS PO) Take 1 tablet by mouth daily.  Yes Historical Provider, MD  omeprazole (PRILOSEC) 20 MG capsule Take 20 mg by mouth daily.   Yes Historical Provider, MD  OVER THE COUNTER MEDICATION Take 1 capsule by mouth daily. Co Q-10 & Cinnamon [phone] mg capsule.   Yes Historical Provider, MD  OVER THE COUNTER MEDICATION Take 1 capsule by mouth daily. Lutigold 20 mg capsule.   Yes Historical Provider, MD   Physical Exam: Filed Vitals:   05/01/14 1249 05/01/14 1256 05/01/14 1617 05/01/14 1750  BP: 129/48 122/49 129/43 147/51    Pulse: 103  81 94  Temp: 97.7 F (36.5 C)     TempSrc: Oral     Resp: 16  16 16   SpO2: 100%  99% 97%    Wt Readings from Last 3 Encounters:  No data found for Wt    General:  Appears calm and comfortable Eyes: PERRL, normal lids, irises & conjunctiva ENT: grossly normal hearing, lips & tongue Neck: no LAD, masses or thyromegaly Cardiovascular: RRR, no m/r/g. No LE edema Respiratory: CTA bilaterally, no w/r/r. Normal respiratory effort. Abdomen: soft, LLQ RLQ tenderness Skin: no rash or induration seen on limited exam Musculoskeletal: grossly normal tone BUE/BLE Psychiatric: grossly normal mood and affect, speech fluent and appropriate Neurologic: grossly non-focal.          Labs on Admission:  Basic Metabolic Panel:  Recent Labs Lab 05/01/14 1324  NA 136  K 4.0  CL 101  CO2 26  GLUCOSE 110*  BUN 10  CREATININE 0.69  CALCIUM 9.1   Liver Function Tests:  Recent Labs Lab 05/01/14 1324  AST 19  ALT 12  ALKPHOS 75  BILITOT 0.4  PROT 7.3  ALBUMIN 3.6    Recent Labs Lab 05/01/14 1324  LIPASE 19   No results for input(s): AMMONIA in the last 168 hours. CBC:  Recent Labs Lab 05/01/14 1324  WBC 14.5*  NEUTROABS 11.7*  HGB 11.1*  HCT 35.6*  MCV 90.1  PLT 712*   Cardiac Enzymes: No results for input(s): CKTOTAL, CKMB, CKMBINDEX, TROPONINI in the last 168 hours.  BNP (last 3 results) No results for input(s): PROBNP in the last 8760 hours. CBG: No results for input(s): GLUCAP in the last 168 hours.  Radiological Exams on Admission: Ct Abdomen Pelvis W Contrast  05/01/2014   CLINICAL DATA:  Diffuse abdominal pain for 1 year. Follow-up diverticulitis with microperforation. Persistent fevers and chills.  EXAM: CT ABDOMEN AND PELVIS WITH CONTRAST  TECHNIQUE: Multidetector CT imaging of the abdomen and pelvis was performed using the standard protocol following bolus administration of intravenous contrast.  CONTRAST:  05/03/2014 OMNIPAQUE IOHEXOL 300 MG/ML   SOLN  COMPARISON:  04/24/2014  FINDINGS: Lower Chest:  Unremarkable.  Hepatobiliary: Stable tiny left hepatic lobe cyst. No masses or other significant abnormality identified.  Pancreas: No mass, inflammatory changes, or other parenchymal abnormality identified.  Spleen:  Within normal limits in size and appearance.  Adrenal Glands:  No mass identified.  Kidneys/Urinary Tract: No masses identified. Stable bilateral parapelvic cysts. No evidence of hydronephrosis.  Stomach/Bowel/Peritoneum: Moderate to severe diverticulitis involving the sigmoid colon is again seen without significant change. Inflammatory changes are again seen in the pericolonic fat and central sigmoid mesocolon, however no definite extraluminal air bubbles are seen on today's study. There is no evidence of abscess or free fluid. No evidence of bowel obstruction.  Vascular/Lymphatic: No pathologically enlarged lymph nodes identified. No other significant abnormality identified.  Reproductive:  No mass or other significant abnormality identified.  Other:  None.  Musculoskeletal:  No suspicious bone lesions identified.  IMPRESSION: Persistent moderate to severe sigmoid diverticulitis. No extraluminal air, abscess, or other complication identified on today's exam.   Electronically Signed   By: Myles Rosenthal M.D.   On: 05/01/2014 17:36      Assessment/Plan Active Problems:   Diverticulitis   1. 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 Anemia -will check iron studies    Code Status: Full Code (must indicate code status--if unknown or must be presumed, indicate so) DVT Prophylaxis:SCDs Family Communication: Husband (indicate person spoken with, if applicable, with phone number if by telephone) Disposition Plan: Home (indicate  anticipated LOS)  Time spent:  [facility] Pager [phone]

## 2014-05-02 ENCOUNTER — Encounter (HOSPITAL_COMMUNITY): Payer: Self-pay | Admitting: *Deleted

## 2014-05-02 LAB — CBC
HCT: 30.7 % — ABNORMAL LOW (ref 36.0–46.0)
Hemoglobin: 9.7 g/dL — ABNORMAL LOW (ref 12.0–15.0)
MCH: 28.1 pg (ref 26.0–34.0)
MCHC: 31.6 g/dL (ref 30.0–36.0)
MCV: 89 fL (ref 78.0–100.0)
Platelets: 583 10*3/uL — ABNORMAL HIGH (ref 150–400)
RBC: 3.45 MIL/uL — AB (ref 3.87–5.11)
RDW: 18.7 % — ABNORMAL HIGH (ref 11.5–15.5)
WBC: 9.2 10*3/uL (ref 4.0–10.5)

## 2014-05-02 LAB — GLUCOSE, CAPILLARY: GLUCOSE-CAPILLARY: 100 mg/dL — AB (ref 70–99)

## 2014-05-02 LAB — HEMOGLOBIN A1C
Hgb A1c MFr Bld: 5.9 % — ABNORMAL HIGH (ref <5.7)
MEAN PLASMA GLUCOSE: 123 mg/dL — AB (ref <117)

## 2014-05-02 MED ORDER — BOOST / RESOURCE BREEZE PO LIQD
1.0000 | Freq: Two times a day (BID) | ORAL | Status: DC
  Administered 2014-05-02: 1 via ORAL

## 2014-05-02 NOTE — Progress Notes (Signed)
[facility] PROGRESS NOTE  Hayde Kilgour Raj EZM:[id number] DOB: 1940-06-19 DOA: 05/01/2014 PCP: Lupe Carney, MD Interim summary: Yvonne Espinoza is a 73 y.o. female presents with diverticulitis Assessment/Plan: 1. Diverticulitis: Admitted for IV antibiotics. Pain and diarrhea are improving.  Resume IV antibiotics, IV fluids and anti emetics as needed.  No nausea or vomiting. She had a colonoscopy less than 3 weeks ago.    Anemia: Baseline around 11 and her H&H is around 9. Plan to repeat in am and get anemia panel. Patient reported some blood in her stools. Monitor.     Code Status: full code Family Communication: none atbedside Disposition Plan: pending.    Consultants:  none  Procedures:  Ct ABDOMEN AND PELVIS  Antibiotics:  UNASYN HPI/Subjective: Wants know when she can go home. Pain is better still having runny stools  Objective: Filed Vitals:   05/02/14 1400  BP: 148/51  Pulse: 95  Temp: 98.2 F (36.8 C)  Resp: 18    Intake/Output Summary (Last 24 hours) at 05/02/14 1711 Last data filed at 05/02/14 1614  Gross per 24 hour  Intake   2640 ml  Output   1975 ml  Net    665 ml   Filed Weights   05/01/14 2002  Weight: 54.3 kg (119 lb 11.4 oz)    Exam:   General:  Alert afebrile comfortable not in any dstress  Cardiovascular: s1s2  Respiratory: ctab  Abdomen: soft MODERATE TENDER NESS IN THE LLQ, NDBS+  Musculoskeletal: no pedal edema.   Data Reviewed: Basic Metabolic Panel:  Recent Labs Lab 05/01/14 1324 05/01/14 2100  NA 136 138  K 4.0 4.0  CL 101 100  CO2 26 28  GLUCOSE 110* 166*  BUN 10 8  CREATININE 0.69 0.70  CALCIUM 9.1 9.0   Liver Function Tests:  Recent Labs Lab 05/01/14 1324 05/01/14 2100  AST 19 19  ALT 12 13  ALKPHOS 75 68  BILITOT 0.4 0.6  PROT 7.3 6.5  ALBUMIN 3.6 3.1*    Recent Labs Lab 05/01/14 1324  LIPASE 19   No results for input(s): AMMONIA in the last 168 hours. CBC:  Recent  Labs Lab 05/01/14 1324 05/02/14 0420  WBC 14.5* 9.2  NEUTROABS 11.7*  --   HGB 11.1* 9.7*  HCT 35.6* 30.7*  MCV 90.1 89.0  PLT 712* 583*   Cardiac Enzymes: No results for input(s): CKTOTAL, CKMB, CKMBINDEX, TROPONINI in the last 168 hours. BNP (last 3 results) No results for input(s): PROBNP in the last 8760 hours. CBG:  Recent Labs Lab 05/02/14 0756  GLUCAP 100*    No results found for this or any previous visit (from the past 240 hour(s)).   Studies: Ct Abdomen Pelvis W Contrast  05/01/2014   CLINICAL DATA:  Diffuse abdominal pain for 1 year. Follow-up diverticulitis with microperforation. Persistent fevers and chills.  EXAM: CT ABDOMEN AND PELVIS WITH CONTRAST  TECHNIQUE: Multidetector CT imaging of the abdomen and pelvis was performed using the standard protocol following bolus administration of intravenous contrast.  CONTRAST:  OMNIPAQUE IOHEXOL 300 MG/ML  SOLN  COMPARISON:  04/24/2014  FINDINGS: Lower Chest:  Unremarkable.  Hepatobiliary: Stable tiny left hepatic lobe cyst. No masses or other significant abnormality identified.  Pancreas: No mass, inflammatory changes, or other parenchymal abnormality identified.  Spleen:  Within normal limits in size and appearance.  Adrenal Glands:  No mass identified.  Kidneys/Urinary Tract: No masses identified. Stable bilateral parapelvic cysts. No evidence of hydronephrosis.  Stomach/Bowel/Peritoneum: Moderate to  severe diverticulitis involving the sigmoid colon is again seen without significant change. Inflammatory changes are again seen in the pericolonic fat and central sigmoid mesocolon, however no definite extraluminal air bubbles are seen on today's study. There is no evidence of abscess or free fluid. No evidence of bowel obstruction.  Vascular/Lymphatic: No pathologically enlarged lymph nodes identified. No other significant abnormality identified.  Reproductive:  No mass or other significant abnormality identified.  Other:   None.  Musculoskeletal:  No suspicious bone lesions identified.  IMPRESSION: Persistent moderate to severe sigmoid diverticulitis. No extraluminal air, abscess, or other complication identified on today's exam.   Electronically Signed   By: Myles Rosenthal M.D.   On: 05/01/2014 17:36    Scheduled Meds: . ampicillin-sulbactam (UNASYN) IV  3 g Intravenous Q6H  . feeding supplement (RESOURCE BREEZE)  1 Container Oral BID BM  . folic acid  1 mg Oral TID  . [START ON 05/05/2014] methotrexate  20 mg Subcutaneous Weekly  . multivitamin with minerals  1 tablet Oral Daily  . pantoprazole  40 mg Oral Daily  . thiamine  100 mg Oral Daily   Continuous Infusions: . sodium chloride 75 mL/hr at 05/01/14 2127    Active Problems:   Diverticulitis    Time spent: 15 minutes.     [facility]  [facility] Pager [phone] If 7PM-7AM, please contact night-coverage at [email or web address], password [facility] 05/02/2014, 5:11 PM  LOS: 1 day

## 2014-05-02 NOTE — Progress Notes (Signed)
INITIAL NUTRITION ASSESSMENT  DOCUMENTATION CODES Per approved criteria  -Not Applicable   INTERVENTION: -Provide Resource Breeze po BID, each supplement provides 250 kcal and 9 grams of protein -Brief diverticulitis diet education -Encourage PO intake -RD to continue to monitor  NUTRITION DIAGNOSIS: Food and nutrition-related knowledge deficit related to diverticulitis as evidenced by pt request for education.   Goal: Pt to meet >/= 90% of their estimated nutrition needs   Monitor:  PO and supplemental intake, weight, labs, I/O's  Reason for Assessment: Pt identified as at nutrition risk on the Malnutrition Screen Tool  Admitting Dx: Abdominal pain  ASSESSMENT: 73 y.o. female presents with diverticulitis. She states that she had been having diarrhea in the mornings.She states that she had been having no vomiting but did have some nausea.   Provided pt with brief diverticulitis diet education per pt request. PO intake: 75-100% Pt states that she was trying to lose weight to better her health but was not expecting to lose this much so fast. Pt states that her UBW is 135 lb (12% weight loss x 4-5 months, significant for time frame).  Nutrition focused physical exam shows no sign of depletion of muscle mass or body fat.  Pt states that she was trying to drink Boost drinks at home but they caused gas. Pt would like to try Resource Breeze supplements. RD to order BID.  Labs reviewed: Glucose 166  Height: Ht Readings from Last 1 Encounters:  05/01/14 5' (1.524 m)    Weight: Wt Readings from Last 1 Encounters:  05/01/14 119 lb 11.4 oz (54.3 kg)    Ideal Body Weight: 100 lb  % Ideal Body Weight: 119%  Wt Readings from Last 10 Encounters:  05/01/14 119 lb 11.4 oz (54.3 kg)    Usual Body Weight: 135 lb  % Usual Body Weight: 88%  BMI:  Body mass index is 23.38 kg/(m^2).  Estimated Nutritional Needs: Kcal: 1400-1600 Protein: 65-75g Fluid: 1.5L/day  Skin:  intact  Diet Order: Diet full liquid  EDUCATION NEEDS: -Education needs addressed   Intake/Output Summary (Last 24 hours) at 05/02/14 1256 Last data filed at 05/02/14 1240  Gross per 24 hour  Intake   2000 ml  Output   1575 ml  Net    425 ml    Last BM: 12/28  Labs:   Recent Labs Lab 05/01/14 1324 05/01/14 2100  NA 136 138  K 4.0 4.0  CL 101 100  CO2 26 28  BUN 10 8  CREATININE 0.69 0.70  CALCIUM 9.1 9.0  GLUCOSE 110* 166*    CBG (last 3)   Recent Labs  05/02/14 0756  GLUCAP 100*    Scheduled Meds: . ampicillin-sulbactam (UNASYN) IV  3 g Intravenous Q6H  . folic acid  1 mg Oral TID  . [START ON 05/05/2014] methotrexate  20 mg Subcutaneous Weekly  . multivitamin with minerals  1 tablet Oral Daily  . pantoprazole  40 mg Oral Daily  . thiamine  100 mg Oral Daily    Continuous Infusions: . sodium chloride 75 mL/hr at 05/01/14 2127    Past Medical History  Diagnosis Date  . TIA (transient ischemic attack)     2209,2010  . Spinal stenosis   . Diverticulitis   . Arthritis     Past Surgical History  Procedure Laterality Date  . Shoulder surgery      right 2010    Yvonne Franco, MS, RD, LDN Pager: [phone] After Hours Pager: [phone]

## 2014-05-03 DIAGNOSIS — K5732 Diverticulitis of large intestine without perforation or abscess without bleeding: Principal | ICD-10-CM

## 2014-05-03 LAB — IRON AND TIBC
Iron: 22 ug/dL — ABNORMAL LOW (ref 42–145)
Saturation Ratios: 18 % — ABNORMAL LOW (ref 20–55)
TIBC: 123 ug/dL — ABNORMAL LOW (ref 250–470)
UIBC: 101 ug/dL — ABNORMAL LOW (ref 125–400)

## 2014-05-03 LAB — GLUCOSE, CAPILLARY: GLUCOSE-CAPILLARY: 90 mg/dL (ref 70–99)

## 2014-05-03 LAB — VITAMIN B12: Vitamin B-12: 977 pg/mL — ABNORMAL HIGH (ref 211–911)

## 2014-05-03 LAB — FERRITIN: FERRITIN: 248 ng/mL (ref 10–291)

## 2014-05-03 LAB — FOLATE: Folate: >20 ng/mL

## 2014-05-03 MED ORDER — AMOXICILLIN-POT CLAVULANATE 875-125 MG PO TABS: 1.0000 | ORAL_TABLET | Freq: Two times a day (BID) | ORAL | Status: DC

## 2014-05-03 MED ORDER — AMOXICILLIN-POT CLAVULANATE 875-125 MG PO TABS
1.0000 | ORAL_TABLET | Freq: Two times a day (BID) | ORAL | Status: DC
  Administered 2014-05-03: 1 via ORAL
  Filled 2014-05-03 (×2): qty 1

## 2014-05-03 MED ORDER — HYDROCODONE-ACETAMINOPHEN 5-325 MG PO TABS: 1.0000 | ORAL_TABLET | ORAL | Status: DC | PRN

## 2014-05-03 NOTE — Progress Notes (Signed)
Nurse reviewed discharge instructions with pt. Pt verbalized understanding of discharge instructions, follow up appointment and new medications.  No concerns at time of discharge. 

## 2014-05-03 NOTE — Discharge Instructions (Signed)

## 2014-05-03 NOTE — Discharge Summary (Addendum)
[facility]  Physician Discharge Summary   Patient ID: Yvonne Espinoza MRN: [id number] DOB/AGE: [birth year] 73 y.o.  Admit date: 05/01/2014 Discharge date: 05/03/2014  PCP: Lupe Carney, MD  DISCHARGE DIAGNOSES:  Active Problems:   Diverticulitis   Diverticulitis of large intestine without perforation or abscess without bleeding   RECOMMENDATIONS FOR OUTPATIENT FOLLOW UP: 1. Recommended to follow-up with her gastroenterologist 2. Check CBC as outpatient  DISCHARGE CONDITION: fair  Diet recommendation: Soft, bland diet for 4 days followed by a high fiber diet  Filed Weights   05/01/14 2002  Weight: 54.3 kg (119 lb 11.4 oz)    INITIAL HISTORY: 73 year old Caucasian female presented with abdominal pain. She was found to have diverticulitis without any complicating features. She had failed outpatient treatment.  Consultations:  None  Procedures:  None  HOSPITAL COURSE:   Acute diverticulitis. She was started on Unasyn. She slowly improved. She was started back on her diet. She wanted to go home. Today, she was advanced to solids, which he tolerated. Denied any nausea, vomiting. No diarrhea. Pain is better. She'll be discharged on Augmentin, which was initiated this morning and which she has tolerated as well. She was noted to be shivering by the nurse. However, according to the patient. She's had this for the last 6 months and follows up with her PCP for the same. She is afebrile.  Normocytic anemia. There was an initial drop in her hemoglobin, most likely due to dilution, as she was hemoconcentrated on admission. There was no overt bleeding that was noted. This can be followed by her PCP.  Overall, hemodynamically stable. Hemoglobin was not checked earlier today for unclear reasons. This can be pursued as an outpatient. No overt bleeding has been noted in the hospital. Drop in hemoglobin was likely dilutional. She can be discharged home.  PERTINENT  LABS:  The results of significant diagnostics from this hospitalization (including imaging, microbiology, ancillary and laboratory) are listed below for reference.     Labs: Basic Metabolic Panel:  Recent Labs Lab 05/01/14 1324 05/01/14 2100  NA 136 138  K 4.0 4.0  CL 101 100  CO2 26 28  GLUCOSE 110* 166*  BUN 10 8  CREATININE 0.69 0.70  CALCIUM 9.1 9.0   Liver Function Tests:  Recent Labs Lab 05/01/14 1324 05/01/14 2100  AST 19 19  ALT 12 13  ALKPHOS 75 68  BILITOT 0.4 0.6  PROT 7.3 6.5  ALBUMIN 3.6 3.1*    Recent Labs Lab 05/01/14 1324  LIPASE 19   CBC:  Recent Labs Lab 05/01/14 1324 05/02/14 0420  WBC 14.5* 9.2  NEUTROABS 11.7*  --   HGB 11.1* 9.7*  HCT 35.6* 30.7*  MCV 90.1 89.0  PLT 712* 583*   CBG:  Recent Labs Lab 05/02/14 0756 05/03/14 0724  GLUCAP 100* 90     IMAGING STUDIES Ct Abdomen Pelvis W Contrast  05/01/2014   CLINICAL DATA:  Diffuse abdominal pain for 1 year. Follow-up diverticulitis with microperforation. Persistent fevers and chills.  EXAM: CT ABDOMEN AND PELVIS WITH CONTRAST  TECHNIQUE: Multidetector CT imaging of the abdomen and pelvis was performed using the standard protocol following bolus administration of intravenous contrast.  CONTRAST:  OMNIPAQUE IOHEXOL 300 MG/ML  SOLN  COMPARISON:  04/24/2014  FINDINGS: Lower Chest:  Unremarkable.  Hepatobiliary: Stable tiny left hepatic lobe cyst. No masses or other significant abnormality identified.  Pancreas: No mass, inflammatory changes, or other parenchymal abnormality identified.  Spleen:  Within normal limits in size  and appearance.  Adrenal Glands:  No mass identified.  Kidneys/Urinary Tract: No masses identified. Stable bilateral parapelvic cysts. No evidence of hydronephrosis.  Stomach/Bowel/Peritoneum: Moderate to severe diverticulitis involving the sigmoid colon is again seen without significant change. Inflammatory changes are again seen in the pericolonic fat and  central sigmoid mesocolon, however no definite extraluminal air bubbles are seen on today's study. There is no evidence of abscess or free fluid. No evidence of bowel obstruction.  Vascular/Lymphatic: No pathologically enlarged lymph nodes identified. No other significant abnormality identified.  Reproductive:  No mass or other significant abnormality identified.  Other:  None.  Musculoskeletal:  No suspicious bone lesions identified.  IMPRESSION: Persistent moderate to severe sigmoid diverticulitis. No extraluminal air, abscess, or other complication identified on today's exam.   Electronically Signed   By: Myles Rosenthal M.D.   On: 05/01/2014 17:36     DISCHARGE EXAMINATION: Filed Vitals:   05/02/14 0520 05/02/14 1400 05/02/14 1931 05/03/14 0520  BP: 113/49 148/51 132/51 118/57  Pulse: 89 95 95 97  Temp: 98.4 F (36.9 C) 98.2 F (36.8 C) 98.1 F (36.7 C) 98.3 F (36.8 C)  TempSrc: Oral Oral Oral Oral  Resp: 16 18 18 18   Height:      Weight:      SpO2: 97% 99% 99% 97%   General appearance: alert, cooperative, appears stated age and no distress Resp: clear to auscultation bilaterally Cardio: regular rate and rhythm, S1, S2 normal, no murmur, click, rub or gallop GI: soft, minimally tender in the lower quadrants without any rebound, rigidity or guarding; bowel sounds normal; no masses,  no organomegaly  DISPOSITION: Home  Discharge Instructions    Call MD for:  difficulty breathing, headache or visual disturbances    Complete by:  As directed      Call MD for:  extreme fatigue    Complete by:  As directed      Call MD for:  persistant dizziness or light-headedness    Complete by:  As directed      Call MD for:  persistant nausea and vomiting    Complete by:  As directed      Call MD for:  severe uncontrolled pain    Complete by:  As directed      Call MD for:  temperature >100.4    Complete by:  As directed      Discharge diet:    Complete by:  As directed   Soft bland diet for  the next 4-5 days and then may resume high-fiber diet.     Discharge instructions    Complete by:  As directed   Please follow-up with your primary care physician and gastroenterologist.     Increase activity slowly    Complete by:  As directed            ALLERGIES:  Allergies  Allergen Reactions  . Gabapentin Other (See Comments)    "every side effect listed on pamphlet"  . Neosporin [Neomycin-Bacitracin Zn-Polymyx]   . Prednisone Other (See Comments)    Tachycardia/mood swings    Discharge Medication List as of 05/03/2014  1:40 PM    START taking these medications   Details  amoxicillin-clavulanate (AUGMENTIN) 875-125 MG per tablet Take 1 tablet by mouth every 12 (twelve) hours. For 12 more days., Starting 05/03/2014, Until Discontinued, Print    HYDROcodone-acetaminophen (NORCO/VICODIN) 5-325 MG per tablet Take 1-2 tablets by mouth every 4 (four) hours as needed for moderate pain., Starting 05/03/2014, Until  Discontinued, Print      CONTINUE these medications which have NOT CHANGED   Details  BIOTIN PO Take 10,000 mg by mouth daily., Until Discontinued, Historical Med    folic acid (FOLVITE) 1 MG tablet Take 1 mg by mouth 3 (three) times daily., Until Discontinued, Historical Med    Garlic 1000 MG CAPS Take 1,000 mg by mouth daily., Until Discontinued, Historical Med    Methotrexate, Anti-Rheumatic, (METHOTREXATE, PF, [place]) Inject 20 mg into the skin once a week. Take injection Every Friday., Until Discontinued, Historical Med    Multiple Vitamins-Minerals (WOMENS MULTIVITAMIN PLUS PO) Take 1 tablet by mouth daily., Until Discontinued, Historical Med    omeprazole (PRILOSEC) 20 MG capsule Take 20 mg by mouth daily., Until Discontinued, Historical Med    !! OVER THE COUNTER MEDICATION Take 1 capsule by mouth daily. Co Q-10 & Cinnamon [phone] mg capsule., Until Discontinued, Historical Med    !! OVER THE COUNTER MEDICATION Take 1 capsule by mouth daily. Lutigold 20 mg  capsule., Until Discontinued, Historical Med     !! - Potential duplicate medications found. Please discuss with provider.    STOP taking these medications     ciprofloxacin (CIPRO) 500 MG tablet      metroNIDAZOLE (FLAGYL) 500 MG tablet        Follow-up Information    Follow up with Lupe Carney, MD. Schedule an appointment as soon as possible for a visit in 1 week.   Specialty:  Family Medicine   Why:  post hospitalization follow up   Contact information:   [address] [place] [address] [phone]       Follow up with Laurell Roof, MD. Schedule an appointment as soon as possible for a visit in 10 days.   Specialty:  Unknown Physician Specialty   Contact information:   [address] [place] [address] [phone]       TOTAL DISCHARGE TIME: 35 mins.  [facility]  [facility] Pager [phone]  05/03/2014, 2:51 PM

## 2014-06-22 ENCOUNTER — Inpatient Hospital Stay (HOSPITAL_COMMUNITY)
Admission: EM | Admit: 2014-06-22 | Discharge: 2014-07-05 | DRG: 330 | Disposition: A | Discharge status: Home Health | Payer: Medicare Other | Attending: Internal Medicine | Admitting: Internal Medicine

## 2014-06-22 ENCOUNTER — Encounter (HOSPITAL_COMMUNITY): Payer: Self-pay | Admitting: *Deleted

## 2014-06-22 DIAGNOSIS — D72829 Elevated white blood cell count, unspecified: Secondary | ICD-10-CM | POA: Diagnosis not present

## 2014-06-22 DIAGNOSIS — K567 Ileus, unspecified: Secondary | ICD-10-CM

## 2014-06-22 DIAGNOSIS — E876 Hypokalemia: Secondary | ICD-10-CM | POA: Diagnosis not present

## 2014-06-22 DIAGNOSIS — E860 Dehydration: Secondary | ICD-10-CM | POA: Diagnosis present

## 2014-06-22 DIAGNOSIS — M1990 Unspecified osteoarthritis, unspecified site: Secondary | ICD-10-CM | POA: Diagnosis present

## 2014-06-22 DIAGNOSIS — D62 Acute posthemorrhagic anemia: Secondary | ICD-10-CM | POA: Diagnosis not present

## 2014-06-22 DIAGNOSIS — K9189 Other postprocedural complications and disorders of digestive system: Secondary | ICD-10-CM

## 2014-06-22 DIAGNOSIS — K5721 Diverticulitis of large intestine with perforation and abscess with bleeding: Secondary | ICD-10-CM

## 2014-06-22 DIAGNOSIS — L0291 Cutaneous abscess, unspecified: Secondary | ICD-10-CM

## 2014-06-22 DIAGNOSIS — K219 Gastro-esophageal reflux disease without esophagitis: Secondary | ICD-10-CM | POA: Diagnosis present

## 2014-06-22 DIAGNOSIS — K5720 Diverticulitis of large intestine with perforation and abscess without bleeding: Principal | ICD-10-CM

## 2014-06-22 DIAGNOSIS — Z9889 Other specified postprocedural states: Secondary | ICD-10-CM

## 2014-06-22 DIAGNOSIS — Z8719 Personal history of other diseases of the digestive system: Secondary | ICD-10-CM | POA: Diagnosis present

## 2014-06-22 DIAGNOSIS — R1032 Left lower quadrant pain: Secondary | ICD-10-CM | POA: Diagnosis not present

## 2014-06-22 DIAGNOSIS — Z8673 Personal history of transient ischemic attack (TIA), and cerebral infarction without residual deficits: Secondary | ICD-10-CM

## 2014-06-22 DIAGNOSIS — K913 Postprocedural intestinal obstruction: Secondary | ICD-10-CM | POA: Diagnosis present

## 2014-06-22 DIAGNOSIS — N9971 Accidental puncture and laceration of a genitourinary system organ or structure during a genitourinary system procedure: Secondary | ICD-10-CM | POA: Diagnosis not present

## 2014-06-22 DIAGNOSIS — M4800 Spinal stenosis, site unspecified: Secondary | ICD-10-CM | POA: Diagnosis present

## 2014-06-22 DIAGNOSIS — I959 Hypotension, unspecified: Secondary | ICD-10-CM | POA: Diagnosis present

## 2014-06-22 DIAGNOSIS — K5780 Diverticulitis of intestine, part unspecified, with perforation and abscess without bleeding: Secondary | ICD-10-CM

## 2014-06-22 HISTORY — DX: Cerebral infarction, unspecified: I63.9

## 2014-06-22 HISTORY — DX: Gastro-esophageal reflux disease without esophagitis: K21.9

## 2014-06-22 LAB — COMPREHENSIVE METABOLIC PANEL
ALBUMIN: 3.3 g/dL — AB (ref 3.5–5.2)
ALT: 13 U/L (ref 0–35)
ANION GAP: 8 (ref 5–15)
AST: 19 U/L (ref 0–37)
Alkaline Phosphatase: 63 U/L (ref 39–117)
BUN: 8 mg/dL (ref 6–23)
CALCIUM: 9.2 mg/dL (ref 8.4–10.5)
CO2: 29 mmol/L (ref 19–32)
Chloride: 100 mmol/L (ref 96–112)
Creatinine, Ser: 0.66 mg/dL (ref 0.50–1.10)
GFR calc Af Amer: >90 mL/min (ref >90)
GFR calc non Af Amer: 86 mL/min — ABNORMAL LOW (ref >90)
Glucose, Bld: 93 mg/dL (ref 70–99)
POTASSIUM: 3.8 mmol/L (ref 3.5–5.1)
SODIUM: 137 mmol/L (ref 135–145)
TOTAL PROTEIN: 7.6 g/dL (ref 6.0–8.3)
Total Bilirubin: 0.6 mg/dL (ref 0.3–1.2)

## 2014-06-22 LAB — CBC WITH DIFFERENTIAL/PLATELET
BASOS ABS: 0 10*3/uL (ref 0.0–0.1)
BASOS PCT: 0 % (ref 0–1)
EOS PCT: 4 % (ref 0–5)
Eosinophils Absolute: 0.4 10*3/uL (ref 0.0–0.7)
HEMATOCRIT: 32.9 % — AB (ref 36.0–46.0)
HEMOGLOBIN: 10.6 g/dL — AB (ref 12.0–15.0)
LYMPHS PCT: 21 % (ref 12–46)
Lymphs Abs: 2 10*3/uL (ref 0.7–4.0)
MCH: 27.5 pg (ref 26.0–34.0)
MCHC: 32.2 g/dL (ref 30.0–36.0)
MCV: 85.2 fL (ref 78.0–100.0)
MONOS PCT: 11 % (ref 3–12)
Monocytes Absolute: 1 10*3/uL (ref 0.1–1.0)
Neutro Abs: 6.2 10*3/uL (ref 1.7–7.7)
Neutrophils Relative %: 64 % (ref 43–77)
Platelets: 537 10*3/uL — ABNORMAL HIGH (ref 150–400)
RBC: 3.86 MIL/uL — ABNORMAL LOW (ref 3.87–5.11)
RDW: 17 % — AB (ref 11.5–15.5)
WBC: 9.6 10*3/uL (ref 4.0–10.5)

## 2014-06-22 LAB — URINALYSIS, ROUTINE W REFLEX MICROSCOPIC
Bilirubin Urine: NEGATIVE
Glucose, UA: NEGATIVE mg/dL
KETONES UR: 15 mg/dL — AB
NITRITE: NEGATIVE
Protein, ur: NEGATIVE mg/dL
Specific Gravity, Urine: 1.007 (ref 1.005–1.030)
Urobilinogen, UA: 0.2 mg/dL (ref 0.0–1.0)
pH: 6 (ref 5.0–8.0)

## 2014-06-22 LAB — URINE MICROSCOPIC-ADD ON

## 2014-06-22 LAB — LIPASE, BLOOD: Lipase: 22 U/L (ref 11–59)

## 2014-06-22 MED ORDER — ONDANSETRON HCL 4 MG/2ML IJ SOLN
4.0000 mg | Freq: Four times a day (QID) | INTRAMUSCULAR | Status: DC | PRN
  Administered 2014-06-29 – 2014-07-05 (×10): 4 mg via INTRAVENOUS
  Filled 2014-06-22 (×10): qty 2

## 2014-06-22 MED ORDER — METRONIDAZOLE IN NACL 5-0.79 MG/ML-% IV SOLN
500.0000 mg | Freq: Once | INTRAVENOUS | Status: AC
  Administered 2014-06-22: 500 mg via INTRAVENOUS
  Filled 2014-06-22: qty 100

## 2014-06-22 MED ORDER — CIPROFLOXACIN IN D5W 400 MG/200ML IV SOLN
400.0000 mg | Freq: Once | INTRAVENOUS | Status: DC
  Administered 2014-06-22: 400 mg via INTRAVENOUS
  Filled 2014-06-22: qty 200

## 2014-06-22 MED ORDER — ONDANSETRON HCL 4 MG PO TABS: 4.0000 mg | ORAL_TABLET | Freq: Four times a day (QID) | ORAL | Status: DC | PRN

## 2014-06-22 MED ORDER — MORPHINE SULFATE 2 MG/ML IJ SOLN: 2.0000 mg | INTRAMUSCULAR | Status: DC | PRN

## 2014-06-22 MED ORDER — DEXTROSE-NACL 5-0.45 % IV SOLN
INTRAVENOUS | Status: DC
  Administered 2014-06-22 – 2014-06-26 (×5): via INTRAVENOUS

## 2014-06-22 MED ORDER — FOLIC ACID 1 MG PO TABS
1.0000 mg | ORAL_TABLET | Freq: Every day | ORAL | Status: DC
  Administered 2014-06-23 – 2014-06-28 (×4): 1 mg via ORAL
  Filled 2014-06-22 (×4): qty 1

## 2014-06-22 MED ORDER — ACETAMINOPHEN 650 MG RE SUPP: 650.0000 mg | Freq: Four times a day (QID) | RECTAL | Status: DC | PRN

## 2014-06-22 MED ORDER — PIPERACILLIN-TAZOBACTAM 3.375 G IVPB
3.3750 g | Freq: Three times a day (TID) | INTRAVENOUS | Status: DC
  Administered 2014-06-22 – 2014-07-04 (×34): 3.375 g via INTRAVENOUS
  Filled 2014-06-22 (×37): qty 50

## 2014-06-22 MED ORDER — HEPARIN SODIUM (PORCINE) 5000 UNIT/ML IJ SOLN
5000.0000 [IU] | Freq: Three times a day (TID) | INTRAMUSCULAR | Status: DC
  Administered 2014-06-23 – 2014-06-26 (×10): 5000 [IU] via SUBCUTANEOUS
  Filled 2014-06-22 (×11): qty 1

## 2014-06-22 MED ORDER — ACETAMINOPHEN 325 MG PO TABS
650.0000 mg | ORAL_TABLET | Freq: Four times a day (QID) | ORAL | Status: DC | PRN
  Administered 2014-06-24: 650 mg via ORAL
  Filled 2014-06-22: qty 2

## 2014-06-22 NOTE — Progress Notes (Signed)
ANTIBIOTIC CONSULT NOTE - INITIAL  Pharmacy Consult for Zosyn Indication: Intra-abdominal infection  Allergies  Allergen Reactions  . Gabapentin Other (See Comments)    "every side effect listed on pamphlet"  . Neosporin [Neomycin-Bacitracin Zn-Polymyx]   . Prednisone Other (See Comments)    Tachycardia/mood swings    Patient Measurements: Height: 5' (152.4 cm) Weight: 116 lb 2.9 oz (52.7 kg) IBW/kg (Calculated) : 45.5  Vital Signs: Temp: 97.7 F (36.5 C) (02/18 2216) Temp Source: Oral (02/18 2216) BP: 130/37 mmHg (02/18 2216) Pulse Rate: 78 (02/18 2216) Intake/Output from previous day:   Intake/Output from this shift:    Labs:  Recent Labs  06/22/14 1709  WBC 9.6  HGB 10.6*  PLT 537*  CREATININE 0.66   Estimated Creatinine Clearance: 45.7 mL/min (by C-G formula based on Cr of 0.66). No results for input(s): VANCOTROUGH, VANCOPEAK, VANCORANDOM, GENTTROUGH, GENTPEAK, GENTRANDOM, TOBRATROUGH, TOBRAPEAK, TOBRARND, AMIKACINPEAK, AMIKACINTROU, AMIKACIN in the last 72 hours.   Microbiology: No results found for this or any previous visit (from the past 720 hour(s)).  Medical History: Past Medical History  Diagnosis Date  . TIA (transient ischemic attack)     2209,2010  . Spinal stenosis   . Diverticulitis   . Arthritis     Medications:  Prescriptions prior to admission  Medication Sig Dispense Refill Last Dose  . BIOTIN PO Take 10,000 mg by mouth daily.   06/21/2014 at Unknown time  . folic acid (FOLVITE) 1 MG tablet Take 1 mg by mouth 3 (three) times daily.   06/21/2014 at Unknown time  . Garlic 1000 MG CAPS Take 1,000 mg by mouth daily.   06/21/2014 at Unknown time  . Methotrexate, Anti-Rheumatic, (METHOTREXATE, PF, [place]) Inject 20 mg into the skin once a week. Take injection Every Friday.   06/21/2014 at Unknown time  . Multiple Vitamins-Minerals (WOMENS MULTIVITAMIN PLUS PO) Take 1 tablet by mouth daily.   06/21/2014 at Unknown time  . omeprazole (PRILOSEC) 20  MG capsule Take 20 mg by mouth daily.   06/21/2014 at Unknown time  . amoxicillin-clavulanate (AUGMENTIN) 875-125 MG per tablet Take 1 tablet by mouth every 12 (twelve) hours. For 12 more days. 24 tablet 0 06/22/2014 at Unknown time  . HYDROcodone-acetaminophen (NORCO/VICODIN) 5-325 MG per tablet Take 1-2 tablets by mouth every 4 (four) hours as needed for moderate pain. (Patient not taking: Reported on 06/22/2014) 15 tablet 0    Scheduled:  . [START ON 06/23/2014] folic acid  1 mg Oral Daily  . [START ON 06/23/2014] heparin  5,000 Units Subcutaneous 3 times per day   Infusions:  . dextrose 5 % and 0.45% NaCl     Assessment: 74yo female with history of diverticulitis presents with diarrhea off and on for last 6 months. Pharmacy is consulted to dose zosyn for diverticulitis. Pt is afebrile, WBC 9.6, sCr 0.66.  Pt received one dose of Flagyl for possible C.dif.  Goal of Therapy:  Eradication of infection  Plan:  Zosyn 3.375g IV q8h Follow up culture results, renal function, and clinical course F/u on Cdiff PCR to continue Flagyl  Arlean Hopping. Newman Pies, PharmD Clinical Pharmacist Pager [phone] 06/22/2014,10:20 PM

## 2014-06-22 NOTE — ED Provider Notes (Signed)
I saw and evaluated the patient, reviewed the resident's note and I agree with the findings and plan.   EKG Interpretation None       Results for orders placed or performed during the hospital encounter of 06/22/14  CBC with Differential  Result Value Ref Range   WBC 9.6 4.0 - 10.5 K/uL   RBC 3.86 (L) 3.87 - 5.11 MIL/uL   Hemoglobin 10.6 (L) 12.0 - 15.0 g/dL   HCT 60.6 (L) 30.1 - 60.1 %   MCV 85.2 78.0 - 100.0 fL   MCH 27.5 26.0 - 34.0 pg   MCHC 32.2 30.0 - 36.0 g/dL   RDW 09.3 (H) 23.5 - 57.3 %   Platelets 537 (H) 150 - 400 K/uL   Neutrophils Relative % 64 43 - 77 %   Neutro Abs 6.2 1.7 - 7.7 K/uL   Lymphocytes Relative 21 12 - 46 %   Lymphs Abs 2.0 0.7 - 4.0 K/uL   Monocytes Relative 11 3 - 12 %   Monocytes Absolute 1.0 0.1 - 1.0 K/uL   Eosinophils Relative 4 0 - 5 %   Eosinophils Absolute 0.4 0.0 - 0.7 K/uL   Basophils Relative 0 0 - 1 %   Basophils Absolute 0.0 0.0 - 0.1 K/uL  Comprehensive metabolic panel  Result Value Ref Range   Sodium 137 135 - 145 mmol/L   Potassium 3.8 3.5 - 5.1 mmol/L   Chloride 100 96 - 112 mmol/L   CO2 29 19 - 32 mmol/L   Glucose, Bld 93 70 - 99 mg/dL   BUN 8 6 - 23 mg/dL   Creatinine, Ser 2.20 0.50 - 1.10 mg/dL   Calcium 9.2 8.4 - 25.4 mg/dL   Total Protein 7.6 6.0 - 8.3 g/dL   Albumin 3.3 (L) 3.5 - 5.2 g/dL   AST 19 0 - 37 U/L   ALT 13 0 - 35 U/L   Alkaline Phosphatase 63 39 - 117 U/L   Total Bilirubin 0.6 0.3 - 1.2 mg/dL   GFR calc non Af Amer 86 (L) >90 mL/min   GFR calc Af Amer >90 >90 mL/min   Anion gap 8 5 - 15  Lipase, blood  Result Value Ref Range   Lipase 22 11 - 59 U/L  Urinalysis, Routine w reflex microscopic  Result Value Ref Range   Color, Urine YELLOW YELLOW   APPearance CLEAR CLEAR   Specific Gravity, Urine 1.007 1.005 - 1.030   pH 6.0 5.0 - 8.0   Glucose, UA NEGATIVE NEGATIVE mg/dL   Hgb urine dipstick TRACE (A) NEGATIVE   Bilirubin Urine NEGATIVE NEGATIVE   Ketones, ur 15 (A) NEGATIVE mg/dL   Protein, ur  NEGATIVE NEGATIVE mg/dL   Urobilinogen, UA 0.2 0.0 - 1.0 mg/dL   Nitrite NEGATIVE NEGATIVE   Leukocytes, UA TRACE (A) NEGATIVE  Urine microscopic-add on  Result Value Ref Range   Squamous Epithelial / LPF RARE RARE   WBC, UA 0-2 <3 WBC/hpf   RBC / HPF 0-2 <3 RBC/hpf   Bacteria, UA RARE RARE   No results found.  The patient was CT scan concerning diverticulitis with reasonable sized abscess no perforation. CT scan was done a [facility] yesterday results will order presented to the patient today and was told to come in and of to the emergency department. Patient's GI doctor is non-admitting. Consult did the general surgery they recommended interventional radiology drainage of the abscess and medicine will admit. Patient had most recently been on antibiotics for  diverticulitis was Augmentin. Prior to that was on Cipro and Flagyl. Will start IV Cipro and Flagyl here today.  Vanetta Mulders, MD 06/22/14 2219

## 2014-06-22 NOTE — H&P (Signed)
[facility] History and Physical  Donyelle Enyeart Mondesir UUE:[id number] DOB: 08/07/1940 DOA: 06/22/2014  Referring physician: Jon Gills, MD PCP: Lupe Carney, MD   Chief Complaint: Diverticulitis  HPI: Yvonne Espinoza is a 74 y.o. female presents with diverticulitis. Patient states that she has had similar issues dating back to December of 2015. Patient states that she has been having diarrhea forr about 6 months which has been going off and on and she has not been doing any better. Her GI physician is in [place]. She saw him yesterday and was sent for a CT scan of the abdomen. Patient was noted to have severe diverticulitis with abscess formation. She was told to be admitted for IV antibiotics. She has been on oral antibiotics (augmentin) as an outpatient since about January. She states that it has not seemed to help. She has noted bloody stools this morning. She states it pretty much fill the bowl. She also noted increased bloating and gas and had increased mucus also.   Review of Systems:  Constitutional:  No weight loss, night sweats, ++Fevers, ++chills, ++fatigue.  HEENT:  No headaches, Sore throat,  No sneezing, itching, ear ache, nasal congestion, post nasal drip,  Cardio-vascular:  No chest pain, Orthopnea, PND, swelling in lower extremities, anasarca, dizziness, palpitations  GI:  No heartburn, indigestion, ++abdominal pain,no nausea, vomiting, ++diarrhea, ++change in bowel habits  Resp:  No shortness of breath with exertion or at rest. no productive cough, No coughing up of blood.  Skin:  no rash or lesions GU:  no dysuria, change in color of urine, no urgency or frequency Musculoskeletal:  No joint pain or swelling. No decreased range of motion Psych:  No change in mood or affect. No depression or anxiety  Past Medical History  Diagnosis Date  . TIA (transient ischemic attack)     2209,2010  . Spinal stenosis   . Diverticulitis   . Arthritis    Past Surgical  History  Procedure Laterality Date  . Shoulder surgery      right 2010   Social History:  reports that she has never smoked. She has never used smokeless tobacco. She reports that she drinks alcohol. She reports that she does not use illicit drugs.  Allergies  Allergen Reactions  . Gabapentin Other (See Comments)    "every side effect listed on pamphlet"  . Neosporin [Neomycin-Bacitracin Zn-Polymyx]   . Prednisone Other (See Comments)    Tachycardia/mood swings    History reviewed. No pertinent family history.   Prior to Admission medications   Medication Sig Start Date End Date Taking? Authorizing Provider  amoxicillin-clavulanate (AUGMENTIN) 875-125 MG per tablet Take 1 tablet by mouth every 12 (twelve) hours. For 12 more days. 05/03/14  Yes Osvaldo Shipper, MD  BIOTIN PO Take 10,000 mg by mouth daily.   Yes Historical Provider, MD  folic acid (FOLVITE) 1 MG tablet Take 1 mg by mouth 3 (three) times daily.   Yes Historical Provider, MD  Garlic 1000 MG CAPS Take 1,000 mg by mouth daily.   Yes Historical Provider, MD  HYDROcodone-acetaminophen (NORCO/VICODIN) 5-325 MG per tablet Take 1-2 tablets by mouth every 4 (four) hours as needed for moderate pain. Patient not taking: Reported on 06/22/2014 05/03/14   Osvaldo Shipper, MD  Methotrexate, Anti-Rheumatic, (METHOTREXATE, PF, [place]) Inject 20 mg into the skin once a week. Take injection Every Friday.   Yes Historical Provider, MD  Multiple Vitamins-Minerals (WOMENS MULTIVITAMIN PLUS PO) Take 1 tablet by mouth daily.   Yes Historical  Provider, MD  omeprazole (PRILOSEC) 20 MG capsule Take 20 mg by mouth daily.   Yes Historical Provider, MD   Physical Exam: Filed Vitals:   06/22/14 1930 06/22/14 1945 06/22/14 2017 06/22/14 2030  BP: 120/52 119/38 115/36 116/49  Pulse: 73 76 84 68  Temp:      TempSrc:      Resp: 18 18 26 17   Weight:      SpO2: 98% 100% 100% 100%    Wt Readings from Last 3 Encounters:  06/22/14 52.118 kg (114 lb 14.4  oz)  05/01/14 54.3 kg (119 lb 11.4 oz)    General:  Appears calm and comfortable Eyes: PERRL, normal lids, irises & conjunctiva ENT: grossly normal hearing, lips & tongue Neck: no LAD, masses or thyromegaly Cardiovascular: RRR, no m/r/g. No LE edema. Respiratory: CTA bilaterally, no w/r/r. Normal respiratory effort. Abdomen: soft, c/o tenderness in the LLQ area Skin: no rash or induration seen on limited exam Musculoskeletal: grossly normal tone BUE/BLE Psychiatric: grossly normal mood and affect, speech fluent and appropriate Neurologic: grossly non-focal.          Labs on Admission:  Basic Metabolic Panel:  Recent Labs Lab 06/22/14 1709  NA 137  K 3.8  CL 100  CO2 29  GLUCOSE 93  BUN 8  CREATININE 0.66  CALCIUM 9.2   Liver Function Tests:  Recent Labs Lab 06/22/14 1709  AST 19  ALT 13  ALKPHOS 63  BILITOT 0.6  PROT 7.6  ALBUMIN 3.3*    Recent Labs Lab 06/22/14 1709  LIPASE 22   No results for input(s): AMMONIA in the last 168 hours. CBC:  Recent Labs Lab 06/22/14 1709  WBC 9.6  NEUTROABS 6.2  HGB 10.6*  HCT 32.9*  MCV 85.2  PLT 537*   Cardiac Enzymes: No results for input(s): CKTOTAL, CKMB, CKMBINDEX, TROPONINI in the last 168 hours.  BNP (last 3 results) No results for input(s): BNP in the last 8760 hours.  ProBNP (last 3 results) No results for input(s): PROBNP in the last 8760 hours.  CBG: No results for input(s): GLUCAP in the last 168 hours.  Radiological Exams on Admission: No results found.    Assessment/Plan Active Problems:   Diverticulitis   Diverticulitis of intestine with abscess   1. Diverticulitis with abscess -patient has had this going on for some time now. -last CT showed no abscess now she has an abscess -she will be admitted for IV antibiotics started on zosyn and was given flagyl in the ED -surgery to see the patient -?may need drainage of the abscess  2. Anemia -likely from chronic bleeding from her  diverticulitis -will check iron studies  3. Arthritis -she states that she has stopped taking methotrexate and has been doing well -will monitor    Code Status: Full Code (must indicate code status--if unknown or must be presumed, indicate so) DVT Prophylaxis:Heparin Family Communication: Daughter (indicate person spoken with, if applicable, with phone number if by telephone) Disposition Plan: Home (indicate anticipated LOS)  Time spent: 06/24/14  [facility] Pager [phone]

## 2014-06-22 NOTE — ED Provider Notes (Signed)
CSN: [id number]     Arrival date & time 06/22/14  1658 History   First MD Initiated Contact with Patient 06/22/14 1925     Chief Complaint  Patient presents with  . Abdominal Pain     (Consider location/radiation/quality/duration/timing/severity/associated sxs/prior Treatment) HPI Comments: 74 yo F hx of TIA, spinal stenosis, diverticulitis, presents with CC of abdominal pain, abnormal CT.  Pt states she has had chronic LLQ abdominal pain since last May.  She was diagnosed with diverticulitis with microperforation in December of last year, and started on course of Cipro/Flagyl, and then transitioned to Augmentin, which pt continues to take daily.  Pt has had associated on and off chills, mucous/bloody diarrhea.  Pt has followed up with her GI doctor, had colonoscopy 3 weeks ago, which was reportedly WNL.  She states she has had stool cultures which have been negative.  Yesterday she was sent for a CT abdomen/pelvis for continued symptoms, and this demonstrated acute diverticulitis, and chronic abscess of sigmoid mesocolon approximately 3 cm X 2 cm X 2 cm.  Pt was given results today, and advised by her GI doctor to come to ED for further evaluation and management.  Today she c/o LLQ pain, and had subjective fever/chills, episode of bloody/mucous diarrhea this AM.  Denies nausea, vomiting, constipation, rash, myalgias, dysuria, vaginal symptoms, or any other symptoms.  No other concerns.   The history is provided by the patient. No language interpreter was used.    Past Medical History  Diagnosis Date  . TIA (transient ischemic attack)     2209,2010  . Spinal stenosis   . Diverticulitis   . Arthritis    Past Surgical History  Procedure Laterality Date  . Shoulder surgery      right 2010   History reviewed. No pertinent family history. History  Substance Use Topics  . Smoking status: Never Smoker   . Smokeless tobacco: Never Used  . Alcohol Use: Yes     Comment: "rarely"   OB  History    No data available     Review of Systems  Constitutional: Positive for chills. Negative for fever.  Respiratory: Negative for cough and shortness of breath.   Cardiovascular: Negative for chest pain.  Gastrointestinal: Positive for abdominal pain, diarrhea and blood in stool. Negative for nausea, vomiting and constipation.  Genitourinary: Negative for dysuria, vaginal bleeding and vaginal discharge.  Musculoskeletal: Negative for myalgias.  Skin: Negative for rash.  Neurological: Negative for dizziness, weakness, light-headedness, numbness and headaches.  Hematological: Negative for adenopathy. Does not bruise/bleed easily.  All other systems reviewed and are negative.     Allergies  Gabapentin; Neosporin; and Prednisone  Home Medications   Prior to Admission medications   Medication Sig Start Date End Date Taking? Authorizing Provider  amoxicillin-clavulanate (AUGMENTIN) 875-125 MG per tablet Take 1 tablet by mouth every 12 (twelve) hours. For 12 more days. 05/03/14  Yes Osvaldo Shipper, MD  BIOTIN PO Take 10,000 mg by mouth daily.   Yes Historical Provider, MD  folic acid (FOLVITE) 1 MG tablet Take 1 mg by mouth 3 (three) times daily.   Yes Historical Provider, MD  Garlic 1000 MG CAPS Take 1,000 mg by mouth daily.   Yes Historical Provider, MD  HYDROcodone-acetaminophen (NORCO/VICODIN) 5-325 MG per tablet Take 1-2 tablets by mouth every 4 (four) hours as needed for moderate pain. Patient not taking: Reported on 06/22/2014 05/03/14   Osvaldo Shipper, MD  Methotrexate, Anti-Rheumatic, (METHOTREXATE, PF, Delta) Inject 20 mg into the  skin once a week. Take injection Every Friday.   Yes Historical Provider, MD  Multiple Vitamins-Minerals (WOMENS MULTIVITAMIN PLUS PO) Take 1 tablet by mouth daily.   Yes Historical Provider, MD  omeprazole (PRILOSEC) 20 MG capsule Take 20 mg by mouth daily.   Yes Historical Provider, MD   BP 120/52 mmHg  Pulse 73  Temp(Src) 98.2 F (36.8 C)  (Oral)  Resp 18  Wt 114 lb 14.4 oz (52.118 kg)  SpO2 98% Physical Exam  Constitutional: She is oriented to person, place, and time. She appears well-developed and well-nourished.  HENT:  Head: Normocephalic and atraumatic.  Right Ear: External ear normal.  Left Ear: External ear normal.  Nose: Nose normal.  Mouth/Throat: Oropharynx is clear and moist.  Eyes: Conjunctivae and EOM are normal. Pupils are equal, round, and reactive to light.  Neck: Normal range of motion. Neck supple.  Cardiovascular: Normal rate, regular rhythm, normal heart sounds and intact distal pulses.   Pulmonary/Chest: Effort normal and breath sounds normal. No respiratory distress. She has no wheezes. She has no rales. She exhibits no tenderness.  Abdominal: Soft. Bowel sounds are normal. She exhibits no distension and no mass. There is tenderness. There is no rebound and no guarding.  Mild LLQ abdominal TTP, soft, no rebound, no guarding.   Musculoskeletal: Normal range of motion.  Neurological: She is alert and oriented to person, place, and time.  Skin: Skin is warm and dry.  Nursing note and vitals reviewed.   ED Course  Procedures (including critical care time) Labs Review Labs Reviewed  CBC WITH DIFFERENTIAL/PLATELET - Abnormal; Notable for the following:    RBC 3.86 (*)    Hemoglobin 10.6 (*)    HCT 32.9 (*)    RDW 17.0 (*)    Platelets 537 (*)    All other components within normal limits  COMPREHENSIVE METABOLIC PANEL - Abnormal; Notable for the following:    Albumin 3.3 (*)    GFR calc non Af Amer 86 (*)    All other components within normal limits  URINALYSIS, ROUTINE W REFLEX MICROSCOPIC - Abnormal; Notable for the following:    Hgb urine dipstick TRACE (*)    Ketones, ur 15 (*)    Leukocytes, UA TRACE (*)    All other components within normal limits  CLOSTRIDIUM DIFFICILE BY PCR  LIPASE, BLOOD  URINE MICROSCOPIC-ADD ON  TSH  IRON AND TIBC  FERRITIN  HEMOGLOBIN A1C  OCCULT BLOOD X 1  CARD TO LAB, STOOL  COMPREHENSIVE METABOLIC PANEL  CBC   Imaging Review No results found.   EKG Interpretation None      MDM   Final diagnoses:  Diverticulitis of large intestine with abscess without bleeding   74 yo F hx of TIA, spinal stenosis, diverticulitis, presents with CC of abdominal pain, abnormal CT.   Physical exam.  VS WNL  Pt with CT results from [facility] yesterday which demonstrates acute diverticulitis with chronic abscess as described above. CBC, CMP, Lipase, UA all unremarkable. Surgery consulted, and suggest IR drainage.  Pt placed on IV Cipro, Flagyl.  Medicine consulted for admission.  Pt understands and agrees with plan.   Jon Gills  Discussed pt with my attending Dr. Deretha Emory.     Jon Gills, MD 06/23/14 [id number]

## 2014-06-22 NOTE — ED Notes (Signed)
Dr. Webb at bedside

## 2014-06-22 NOTE — ED Notes (Signed)
Attempted report 

## 2014-06-22 NOTE — Consult Note (Signed)
Reason for Consult:diverticulitis Referring Physician: Dr Justin Mend EDP  Yvonne Espinoza is an 74 y.o. female.  HPI: ASKED TO SEE PT DUE TO RECURRENT BOUTS OF DIVERTICULITIS.  Pt came from [place] to ED at the request of her GI MD after a CT of abdomen done in [facility] showed diverticular abscess.  System images not available.  She has had 1 year history of vague lower abdominal pain worked up by GI MD in [place] including colonoscopy which was normal.ADMITTED TO [facility] IN DEC with a flare up.  Continues to have 4 / 10 lower abdominal pain crampy constant.  Reports no diarrhea but a lot of gas,  Past Medical History  Diagnosis Date  . TIA (transient ischemic attack)     2209,2010  . Spinal stenosis   . Diverticulitis   . Arthritis     Past Surgical History  Procedure Laterality Date  . Shoulder surgery      right 2010    History reviewed. No pertinent family history.  Social History:  reports that she has never smoked. She has never used smokeless tobacco. She reports that she drinks alcohol. She reports that she does not use illicit drugs.  Allergies:  Allergies  Allergen Reactions  . Gabapentin Other (See Comments)    "every side effect listed on pamphlet"  . Neosporin [Neomycin-Bacitracin Zn-Polymyx]   . Prednisone Other (See Comments)    Tachycardia/mood swings    Medications: I have reviewed the patient's current medications.  Results for orders placed or performed during the hospital encounter of 06/22/14 (from the past 48 hour(s))  CBC with Differential     Status: Abnormal   Collection Time: 06/22/14  5:09 PM  Result Value Ref Range   WBC 9.6 4.0 - 10.5 K/uL   RBC 3.86 (L) 3.87 - 5.11 MIL/uL   Hemoglobin 10.6 (L) 12.0 - 15.0 g/dL   HCT 32.9 (L) 36.0 - 46.0 %   MCV 85.2 78.0 - 100.0 fL   MCH 27.5 26.0 - 34.0 pg   MCHC 32.2 30.0 - 36.0 g/dL   RDW 17.0 (H) 11.5 - 15.5 %   Platelets 537 (H) 150 - 400 K/uL   Neutrophils Relative % 64 43 - 77 %   Neutro Abs 6.2 1.7 - 7.7  K/uL   Lymphocytes Relative 21 12 - 46 %   Lymphs Abs 2.0 0.7 - 4.0 K/uL   Monocytes Relative 11 3 - 12 %   Monocytes Absolute 1.0 0.1 - 1.0 K/uL   Eosinophils Relative 4 0 - 5 %   Eosinophils Absolute 0.4 0.0 - 0.7 K/uL   Basophils Relative 0 0 - 1 %   Basophils Absolute 0.0 0.0 - 0.1 K/uL  Comprehensive metabolic panel     Status: Abnormal   Collection Time: 06/22/14  5:09 PM  Result Value Ref Range   Sodium 137 135 - 145 mmol/L   Potassium 3.8 3.5 - 5.1 mmol/L   Chloride 100 96 - 112 mmol/L   CO2 29 19 - 32 mmol/L   Glucose, Bld 93 70 - 99 mg/dL   BUN 8 6 - 23 mg/dL   Creatinine, Ser 0.66 0.50 - 1.10 mg/dL   Calcium 9.2 8.4 - 10.5 mg/dL   Total Protein 7.6 6.0 - 8.3 g/dL   Albumin 3.3 (L) 3.5 - 5.2 g/dL   AST 19 0 - 37 U/L   ALT 13 0 - 35 U/L   Alkaline Phosphatase 63 39 - 117 U/L   Total Bilirubin  0.6 0.3 - 1.2 mg/dL   GFR calc non Af Amer 86 (L) >90 mL/min   GFR calc Af Amer >90 >90 mL/min    Comment: (NOTE) The eGFR has been calculated using the CKD EPI equation. This calculation has not been validated in all clinical situations. eGFR's persistently <90 mL/min signify possible Chronic Kidney Disease.    Anion gap 8 5 - 15  Lipase, blood     Status: None   Collection Time: 06/22/14  5:09 PM  Result Value Ref Range   Lipase 22 11 - 59 U/L  Urinalysis, Routine w reflex microscopic     Status: Abnormal   Collection Time: 06/22/14  8:17 PM  Result Value Ref Range   Color, Urine YELLOW YELLOW   APPearance CLEAR CLEAR   Specific Gravity, Urine 1.007 1.005 - 1.030   pH 6.0 5.0 - 8.0   Glucose, UA NEGATIVE NEGATIVE mg/dL   Hgb urine dipstick TRACE (A) NEGATIVE   Bilirubin Urine NEGATIVE NEGATIVE   Ketones, ur 15 (A) NEGATIVE mg/dL   Protein, ur NEGATIVE NEGATIVE mg/dL   Urobilinogen, UA 0.2 0.0 - 1.0 mg/dL   Nitrite NEGATIVE NEGATIVE   Leukocytes, UA TRACE (A) NEGATIVE  Urine microscopic-add on     Status: None   Collection Time: 06/22/14  8:17 PM  Result Value  Ref Range   Squamous Epithelial / LPF RARE RARE   WBC, UA 0-2 <3 WBC/hpf   RBC / HPF 0-2 <3 RBC/hpf   Bacteria, UA RARE RARE    No results found.  Review of Systems  Constitutional: Negative for fever and chills.  HENT: Negative.   Gastrointestinal: Positive for abdominal pain and blood in stool. Negative for diarrhea.  Skin: Negative.   Psychiatric/Behavioral: Negative.    Blood pressure 115/58, pulse 78, temperature 98.2 F (36.8 C), temperature source Oral, resp. rate 16, weight 114 lb 14.4 oz (52.118 kg), SpO2 98 %. Physical Exam  Cardiovascular: Normal rate and regular rhythm.   Respiratory: Effort normal and breath sounds normal.  GI: There is tenderness in the left lower quadrant. There is no rigidity and no guarding. No hernia.  Musculoskeletal: Normal range of motion.  Neurological: She is alert.  Skin: Skin is warm.  Psychiatric: She has a normal mood and affect. Her behavior is normal. Judgment and thought content normal.    Assessment/Plan: Diverticulitis with abscess Cannot view images from [place] but pt by report has abscess recommend IR  consult for drain Will follow   ABX Keep NPO until IR sees pt.  Fausto Sampedro A. 06/22/2014, 9:44 PM

## 2014-06-22 NOTE — ED Notes (Signed)
Pt reports abdominal pain since last may. Pt had a CT scan at [facility] yesterday, result showed abscess in colon. Pt denies n/v, reports diarrhea in the mornings, chills and fever at home.

## 2014-06-23 ENCOUNTER — Encounter (HOSPITAL_COMMUNITY): Payer: Self-pay | Admitting: General Practice

## 2014-06-23 DIAGNOSIS — D500 Iron deficiency anemia secondary to blood loss (chronic): Secondary | ICD-10-CM

## 2014-06-23 DIAGNOSIS — R197 Diarrhea, unspecified: Secondary | ICD-10-CM

## 2014-06-23 LAB — COMPREHENSIVE METABOLIC PANEL
ALT: 11 U/L (ref 0–35)
AST: 14 U/L (ref 0–37)
Albumin: 2.8 g/dL — ABNORMAL LOW (ref 3.5–5.2)
Alkaline Phosphatase: 52 U/L (ref 39–117)
Anion gap: 3 — ABNORMAL LOW (ref 5–15)
BUN: <5 mg/dL — ABNORMAL LOW (ref 6–23)
CO2: 34 mmol/L — AB (ref 19–32)
Calcium: 9 mg/dL (ref 8.4–10.5)
Chloride: 104 mmol/L (ref 96–112)
Creatinine, Ser: 0.71 mg/dL (ref 0.50–1.10)
GFR calc non Af Amer: 84 mL/min — ABNORMAL LOW (ref >90)
GLUCOSE: 107 mg/dL — AB (ref 70–99)
POTASSIUM: 3.7 mmol/L (ref 3.5–5.1)
Sodium: 141 mmol/L (ref 135–145)
TOTAL PROTEIN: 6.6 g/dL (ref 6.0–8.3)
Total Bilirubin: 0.7 mg/dL (ref 0.3–1.2)

## 2014-06-23 LAB — CBC
HCT: 31.9 % — ABNORMAL LOW (ref 36.0–46.0)
HEMOGLOBIN: 10 g/dL — AB (ref 12.0–15.0)
MCH: 26.9 pg (ref 26.0–34.0)
MCHC: 31.3 g/dL (ref 30.0–36.0)
MCV: 85.8 fL (ref 78.0–100.0)
Platelets: 452 10*3/uL — ABNORMAL HIGH (ref 150–400)
RBC: 3.72 MIL/uL — AB (ref 3.87–5.11)
RDW: 17 % — ABNORMAL HIGH (ref 11.5–15.5)
WBC: 7.4 10*3/uL (ref 4.0–10.5)

## 2014-06-23 LAB — FERRITIN: Ferritin: 202 ng/mL (ref 10–291)

## 2014-06-23 LAB — IRON AND TIBC
Iron: 16 ug/dL — ABNORMAL LOW (ref 42–145)
Saturation Ratios: 9 % — ABNORMAL LOW (ref 20–55)
TIBC: 186 ug/dL — ABNORMAL LOW (ref 250–470)
UIBC: 170 ug/dL (ref 125–400)

## 2014-06-23 LAB — PROTIME-INR
INR: 1.14 (ref 0.00–1.49)
Prothrombin Time: 14.8 seconds (ref 11.6–15.2)

## 2014-06-23 LAB — OCCULT BLOOD X 1 CARD TO LAB, STOOL: Fecal Occult Bld: POSITIVE — AB

## 2014-06-23 LAB — GLUCOSE, CAPILLARY: GLUCOSE-CAPILLARY: 95 mg/dL (ref 70–99)

## 2014-06-23 LAB — TSH: TSH: 1.791 u[IU]/mL (ref 0.350–4.500)

## 2014-06-23 LAB — CLOSTRIDIUM DIFFICILE BY PCR: CDIFFPCR: NEGATIVE

## 2014-06-23 NOTE — Progress Notes (Addendum)
IR PA aware of request for CT guided drain placement for diverticulitis, CT from [place] reviewed with Dr. Miles Costain and collection not large/formed enough for drain placement, continue antibiotics and repeat CT if clinically worsens.  Pattricia Boss PA-C Interventional Radiology  06/23/14  8:27 AM

## 2014-06-23 NOTE — Progress Notes (Signed)
PROGRESS NOTE  Yvonne Espinoza SWN:[id number] DOB: Aug 08, 1940 DOA: 06/22/2014 PCP: Lupe Carney, MD  Assessment/Plan: 1. 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 -last CT showed no abscess now she has an abscess- done in [place] - IV antibiotics started on zosyn and was given flagyl in the ED -surgery to see the patient -IR consult as may need drainage of the abscess -surgery consult- recommends IR consult -r/o c diff  2. Anemia -likely from chronic bleeding from her diverticulitis -monitor  3. Arthritis -she states that she has stopped taking methotrexate and has been doing well -will monitor  Code Status: full Family Communication: patient Disposition Plan:    Consultants:  Surgery  IR  Procedures:     HPI/Subjective: Feeling some better since admission -husband having knee replacement surgery today  Objective: Filed Vitals:   06/23/14 0516  BP: 114/44  Pulse: 63  Temp: 97.8 F (36.6 C)  Resp: 16    Intake/Output Summary (Last 24 hours) at 06/23/14 0750 Last data filed at 06/23/14 0631  Gross per 24 hour  Intake    668 ml  Output      0 ml  Net    668 ml   Filed Weights   06/22/14 1703 06/22/14 2216  Weight: 52.118 kg (114 lb 14.4 oz) 52.7 kg (116 lb 2.9 oz)    Exam:   General:  A+Ox3, NAD  Cardiovascular: rrr  Respiratory: clear  Abdomen: +BS, soft  Musculoskeletal: no edema   Data Reviewed: Basic Metabolic Panel:  Recent Labs Lab 06/22/14 1709  NA 137  K 3.8  CL 100  CO2 29  GLUCOSE 93  BUN 8  CREATININE 0.66  CALCIUM 9.2   Liver Function Tests:  Recent Labs Lab 06/22/14 1709  AST 19  ALT 13  ALKPHOS 63  BILITOT 0.6  PROT 7.6  ALBUMIN 3.3*    Recent Labs Lab 06/22/14 1709  LIPASE 22   No results for input(s): AMMONIA in the last 168 hours. CBC:  Recent Labs Lab 06/22/14 1709 06/23/14 0626  WBC 9.6 7.4  NEUTROABS 6.2  --   HGB 10.6*  10.0*  HCT 32.9* 31.9*  MCV 85.2 85.8  PLT 537* 452*   Cardiac Enzymes: No results for input(s): CKTOTAL, CKMB, CKMBINDEX, TROPONINI in the last 168 hours. BNP (last 3 results) No results for input(s): BNP in the last 8760 hours.  ProBNP (last 3 results) No results for input(s): PROBNP in the last 8760 hours.  CBG:  Recent Labs Lab 06/23/14 0730  GLUCAP 95    No results found for this or any previous visit (from the past 240 hour(s)).   Studies: No results found.  Scheduled Meds: . folic acid  1 mg Oral Daily  . heparin  5,000 Units Subcutaneous 3 times per day  . piperacillin-tazobactam (ZOSYN)  IV  3.375 g Intravenous Q8H   Continuous Infusions: . dextrose 5 % and 0.45% NaCl 50 mL/hr at 06/22/14 2225   Antibiotics Given (last 72 hours)    Date/Time Action Medication Dose Rate   06/22/14 2251 Given   ciprofloxacin (CIPRO) IVPB 400 mg 400 mg 200 mL/hr   06/22/14 2329 Given   piperacillin-tazobactam (ZOSYN) IVPB 3.375 g 3.375 g 12.5 mL/hr   06/23/14 0631 Given   piperacillin-tazobactam (ZOSYN) IVPB 3.375 g 3.375 g 12.5 mL/hr      Active Problems:   Diverticulitis   Diverticulitis of intestine with abscess    Time spent: 25  min    Marlin Canary  [facility] Pager [phone]. If 7PM-7AM, please contact night-coverage at [email or web address], password [facility] 06/23/2014, 7:50 AM  LOS: 1 day

## 2014-06-23 NOTE — Progress Notes (Signed)
Patient ID: Yvonne Espinoza, female   DOB: 1940-10-26, 74 y.o.   MRN: [id number]    Subjective: Pt c/o some abdominal pain.  Otherwise gassy.  Objective: Vital signs in last 24 hours: Temp:  [97.7 F (36.5 C)-98.2 F (36.8 C)] 97.8 F (36.6 C) (02/19 0516) Pulse Rate:  [63-91] 63 (02/19 0516) Resp:  [16-26] 16 (02/19 0516) BP: (113-130)/(36-58) 114/44 mmHg (02/19 0516) SpO2:  [98 %-100 %] 98 % (02/19 0516) Weight:  [114 lb 14.4 oz (52.118 kg)-116 lb 2.9 oz (52.7 kg)] 116 lb 2.9 oz (52.7 kg) (02/18 2216) Last BM Date: 06/22/14  Intake/Output from previous day: 02/18 0701 - 02/19 0700 In: 668 [I.V.:668] Out: -  Intake/Output this shift:    PE: Abd: soft, tender throughout the lower abdomen, but greater in LLQ and suprapubic area.  +BS, ND Heart: regular Lungs: CTAB  Lab Results:   Recent Labs  06/22/14 1709 06/23/14 0626  WBC 9.6 7.4  HGB 10.6* 10.0*  HCT 32.9* 31.9*  PLT 537* 452*   BMET  Recent Labs  06/22/14 1709 06/23/14 0626  NA 137 PENDING  K 3.8 3.7  CL 100 PENDING  CO2 29 PENDING  GLUCOSE 93 107*  BUN 8 <5*  CREATININE 0.66 0.71  CALCIUM 9.2 9.0   PT/INR No results for input(s): LABPROT, INR in the last 72 hours. CMP     Component Value Date/Time   NA PENDING 06/23/2014 0626   K 3.7 06/23/2014 0626   CL PENDING 06/23/2014 0626   CO2 PENDING 06/23/2014 0626   GLUCOSE 107* 06/23/2014 0626   BUN <5* 06/23/2014 0626   CREATININE 0.71 06/23/2014 0626   CALCIUM 9.0 06/23/2014 0626   PROT 6.6 06/23/2014 0626   ALBUMIN 2.8* 06/23/2014 0626   AST 14 06/23/2014 0626   ALT 11 06/23/2014 0626   ALKPHOS 52 06/23/2014 0626   BILITOT 0.7 06/23/2014 0626   GFRNONAA 84* 06/23/2014 0626   GFRAA >90 06/23/2014 0626   Lipase     Component Value Date/Time   LIPASE 22 06/22/2014 1709       Studies/Results: No results found.  Anti-infectives: Anti-infectives    Start     Dose/Rate Route Frequency Ordered Stop   06/22/14 2230   piperacillin-tazobactam (ZOSYN) IVPB 3.375 g     3.375 g 12.5 mL/hr over 240 Minutes Intravenous Every 8 hours 06/22/14 2221     06/22/14 2030  ciprofloxacin (CIPRO) IVPB 400 mg  Status:  Discontinued     400 mg 200 mL/hr over 60 Minutes Intravenous  Once 06/22/14 2029 06/22/14 2351   06/22/14 2030  metroNIDAZOLE (FLAGYL) IVPB 500 mg     500 mg 100 mL/hr over 60 Minutes Intravenous  Once 06/22/14 2029 06/22/14 2141       Assessment/Plan  1. Persistent diverticulitis with abscess  2. H/o TIA 3. Spinal stenosis  Plan: 1. IR has evaluated the CT scan and is unable to drain this phlegmon/abscess.  Given the patient's history of medical management failure of her diverticulitis, if she is able to be slowly prepped over the weekend, we may likely plan for surgery next week. 2. Cont IV Zosyn D1.  LOS: 1 day    Havanah Nelms E 06/23/2014, 8:33 AM Pager: [phone]

## 2014-06-23 NOTE — Progress Notes (Signed)
INITIAL NUTRITION ASSESSMENT  DOCUMENTATION CODES Per approved criteria  -Not Applicable   INTERVENTION: -RD to follow for diet advancement -Supplement diet as appropriate  NUTRITION DIAGNOSIS: Inadequate oral intake related to altered GI function as evidenced by NPO.   Goal: Pt will meet >90% of estimated nutritional needs  Monitor:  Diet advancement, PO intake, labs, weight changes, I/O's  Reason for Assessment: MST=3  74 y.o. female  Admitting Dx: <principal problem not specified>  Yvonne Espinoza is a 74 y.o. female presents with diverticulitis. Patient states that she has had similar issues dating back to December of 2015. Patient states that she has been having diarrhea forr about 6 months which has been going off and on and she has not been doing any better.  ASSESSMENT: Pt admitted with diverticulitis.  Pt was asleep at time of visit and did not arouse when name was called. Nutrition-focused physical exam deferred at this time. Pt is currently NPO for bowel rest. Pt has an abscess that is unable to be drained per surgery notes- plan to prep for surgery next week, due to pt's hx of failed medical management. Pt last seen by RD during previous hospitalization at [facility] in 04/2014. UBW of 135#. Pt with hx of intentional weight loss. Wt has been stable since last hospitalization.  Labs reviewed. CO2: 34, BUN <5, Glucose: 107.   Height: Ht Readings from Last 1 Encounters:  06/22/14 5' (1.524 m)    Weight: Wt Readings from Last 1 Encounters:  06/22/14 116 lb 2.9 oz (52.7 kg)    Ideal Body Weight: 100#  % Ideal Body Weight: 116%  Wt Readings from Last 10 Encounters:  06/22/14 116 lb 2.9 oz (52.7 kg)  05/01/14 119 lb 11.4 oz (54.3 kg)    Usual Body Weight: 135#  % Usual Body Weight: 86%  BMI:  Body mass index is 22.69 kg/(m^2). Normal weight range  Estimated Nutritional Needs: Kcal: 1500-1700 Protein: 60-70 grams Fluid: 1.5-1.7 L  Skin: WDL  Diet  Order: Diet NPO time specified  EDUCATION NEEDS: -Education not appropriate at this time   Intake/Output Summary (Last 24 hours) at 06/23/14 1141 Last data filed at 06/23/14 0631  Gross per 24 hour  Intake    668 ml  Output      0 ml  Net    668 ml    Last BM: 06/23/14  Labs:   Recent Labs Lab 06/22/14 1709 06/23/14 0626  NA 137 141  K 3.8 3.7  CL 100 104  CO2 29 34*  BUN 8 <5*  CREATININE 0.66 0.71  CALCIUM 9.2 9.0  GLUCOSE 93 107*    CBG (last 3)   Recent Labs  06/23/14 0730  GLUCAP 95    Scheduled Meds: . folic acid  1 mg Oral Daily  . heparin  5,000 Units Subcutaneous 3 times per day  . piperacillin-tazobactam (ZOSYN)  IV  3.375 g Intravenous Q8H    Continuous Infusions: . dextrose 5 % and 0.45% NaCl 50 mL/hr at 06/22/14 2225    Past Medical History  Diagnosis Date  . TIA (transient ischemic attack)     2209,2010  . Spinal stenosis   . Diverticulitis   . Stroke     TIA 2009  . GERD (gastroesophageal reflux disease)   . Arthritis     RA    Past Surgical History  Procedure Laterality Date  . Shoulder surgery      right 2010  . Knee arthroscopy Bilateral  Yvonne Espinoza A. Jimmye Norman, RD, LDN, CDE Pager: [phone] After hours Pager: [phone]

## 2014-06-24 LAB — BASIC METABOLIC PANEL
Anion gap: 8 (ref 5–15)
BUN: <5 mg/dL — ABNORMAL LOW (ref 6–23)
CALCIUM: 9.3 mg/dL (ref 8.4–10.5)
CHLORIDE: 106 mmol/L (ref 96–112)
CO2: 26 mmol/L (ref 19–32)
Creatinine, Ser: 0.7 mg/dL (ref 0.50–1.10)
GFR calc non Af Amer: 85 mL/min — ABNORMAL LOW (ref >90)
GLUCOSE: 113 mg/dL — AB (ref 70–99)
Potassium: 3.6 mmol/L (ref 3.5–5.1)
Sodium: 140 mmol/L (ref 135–145)

## 2014-06-24 LAB — CBC
HCT: 32 % — ABNORMAL LOW (ref 36.0–46.0)
HEMOGLOBIN: 10.1 g/dL — AB (ref 12.0–15.0)
MCH: 26.9 pg (ref 26.0–34.0)
MCHC: 31.6 g/dL (ref 30.0–36.0)
MCV: 85.3 fL (ref 78.0–100.0)
PLATELETS: 459 10*3/uL — AB (ref 150–400)
RBC: 3.75 MIL/uL — AB (ref 3.87–5.11)
RDW: 17.1 % — ABNORMAL HIGH (ref 11.5–15.5)
WBC: 6.9 10*3/uL (ref 4.0–10.5)

## 2014-06-24 LAB — HEMOGLOBIN A1C
Hgb A1c MFr Bld: 6.2 % — ABNORMAL HIGH (ref 4.8–5.6)
Mean Plasma Glucose: 131 mg/dL

## 2014-06-24 LAB — GLUCOSE, CAPILLARY: GLUCOSE-CAPILLARY: 105 mg/dL — AB (ref 70–99)

## 2014-06-24 MED ORDER — PEG 3350-KCL-NABCB-NACL-NASULF 236 G PO SOLR
4000.0000 mL | Freq: Once | ORAL | Status: AC
  Administered 2014-06-24: 4000 mL via ORAL
  Filled 2014-06-24: qty 4000

## 2014-06-24 NOTE — Progress Notes (Signed)
PROGRESS NOTE  Yvonne Espinoza ZOX:[id number] DOB: 1940/05/16 DOA: 06/22/2014 PCP: Lupe Carney, MD  Assessment/Plan: 1. 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 -last CT showed no abscess now she has an abscess- done in [place] - IV antibiotics started on zosyn and was given flagyl in the ED -surgery consult- area too small of IR to drain - c diff negative  2. Anemia -likely from chronic bleeding from her diverticulitis -monitor  3. Arthritis -she states that she has stopped taking methotrexate and has been doing well -will monitor  Code Status: full Family Communication: patient Disposition Plan:    Consultants:  Surgery  IR  Procedures:     HPI/Subjective: Anxious to go home  Objective: Filed Vitals:   06/23/14 2131  BP: 117/47  Pulse: 63  Temp: 98.1 F (36.7 C)  Resp: 19    Intake/Output Summary (Last 24 hours) at 06/24/14 0836 Last data filed at 06/23/14 1858  Gross per 24 hour  Intake 593.33 ml  Output      0 ml  Net 593.33 ml   Filed Weights   06/22/14 1703 06/22/14 2216  Weight: 52.118 kg (114 lb 14.4 oz) 52.7 kg (116 lb 2.9 oz)    Exam:   General:  A+Ox3, NAD  Cardiovascular: rrr  Respiratory: clear  Abdomen: +BS, soft  Musculoskeletal: no edema   Data Reviewed: Basic Metabolic Panel:  Recent Labs Lab 06/22/14 1709 06/23/14 0626 06/24/14 0511  NA 137 141 140  K 3.8 3.7 3.6  CL 100 104 106  CO2 29 34* 26  GLUCOSE 93 107* 113*  BUN 8 <5* <5*  CREATININE 0.66 0.71 0.70  CALCIUM 9.2 9.0 9.3   Liver Function Tests:  Recent Labs Lab 06/22/14 1709 06/23/14 0626  AST 19 14  ALT 13 11  ALKPHOS 63 52  BILITOT 0.6 0.7  PROT 7.6 6.6  ALBUMIN 3.3* 2.8*    Recent Labs Lab 06/22/14 1709  LIPASE 22   No results for input(s): AMMONIA in the last 168 hours. CBC:  Recent Labs Lab 06/22/14 1709 06/23/14 0626 06/24/14 0511  WBC 9.6 7.4 6.9  NEUTROABS  6.2  --   --   HGB 10.6* 10.0* 10.1*  HCT 32.9* 31.9* 32.0*  MCV 85.2 85.8 85.3  PLT 537* 452* 459*   Cardiac Enzymes: No results for input(s): CKTOTAL, CKMB, CKMBINDEX, TROPONINI in the last 168 hours. BNP (last 3 results) No results for input(s): BNP in the last 8760 hours.  ProBNP (last 3 results) No results for input(s): PROBNP in the last 8760 hours.  CBG:  Recent Labs Lab 06/23/14 0730 06/24/14 0747  GLUCAP 95 105*    Recent Results (from the past 240 hour(s))  Clostridium Difficile by PCR     Status: None   Collection Time: 06/23/14  7:32 AM  Result Value Ref Range Status   C difficile by pcr NEGATIVE NEGATIVE Final     Studies: No results found.  Scheduled Meds: . folic acid  1 mg Oral Daily  . heparin  5,000 Units Subcutaneous 3 times per day  . piperacillin-tazobactam (ZOSYN)  IV  3.375 g Intravenous Q8H   Continuous Infusions: . dextrose 5 % and 0.45% NaCl 50 mL/hr at 06/23/14 1815   Antibiotics Given (last 72 hours)    Date/Time Action Medication Dose Rate   06/22/14 2251 Given   ciprofloxacin (CIPRO) IVPB 400 mg 400 mg 200 mL/hr   06/22/14 2329 Given  piperacillin-tazobactam (ZOSYN) IVPB 3.375 g 3.375 g 12.5 mL/hr   06/23/14 0631 Given   piperacillin-tazobactam (ZOSYN) IVPB 3.375 g 3.375 g 12.5 mL/hr   06/23/14 1542 Given   piperacillin-tazobactam (ZOSYN) IVPB 3.375 g 3.375 g 12.5 mL/hr   06/23/14 2206 Given   piperacillin-tazobactam (ZOSYN) IVPB 3.375 g 3.375 g 12.5 mL/hr   06/24/14 0700 Given   piperacillin-tazobactam (ZOSYN) IVPB 3.375 g 3.375 g 12.5 mL/hr      Active Problems:   Diverticulitis   Diverticulitis of intestine with abscess    Time spent: 25 min    VANN, JESSICA  [facility] Pager [phone]. If 7PM-7AM, please contact night-coverage at [email or web address], password [facility] 06/24/2014, 8:36 AM  LOS: 2 days

## 2014-06-24 NOTE — Progress Notes (Signed)
Patient ID: Yvonne Espinoza, female   DOB: 06-09-1940, 74 y.o.   MRN: [id number]    Subjective: Symptoms of pain improved.    Objective: Vital signs in last 24 hours: Temp:  [98.1 F (36.7 C)-98.6 F (37 C)] 98.1 F (36.7 C) (02/19 2131) Pulse Rate:  [63-64] 63 (02/19 2131) Resp:  [18-19] 19 (02/19 2131) BP: (117-133)/(47-59) 117/47 mmHg (02/19 2131) SpO2:  [97 %-98 %] 98 % (02/19 2131) Last BM Date: 06/23/14  Intake/Output from previous day: 02/19 0701 - 02/20 0700 In: 593.3 [I.V.:593.3] Out: -  Intake/Output this shift:    PE: Abd: soft, tender throughout the lower abdomen, but greater in LLQ and suprapubic area.   Heart: regular Lungs: breathing comfortably  Lab Results:   Recent Labs  06/23/14 0626 06/24/14 0511  WBC 7.4 6.9  HGB 10.0* 10.1*  HCT 31.9* 32.0*  PLT 452* 459*   BMET  Recent Labs  06/23/14 0626 06/24/14 0511  NA 141 140  K 3.7 3.6  CL 104 106  CO2 34* 26  GLUCOSE 107* 113*  BUN <5* <5*  CREATININE 0.71 0.70  CALCIUM 9.0 9.3   PT/INR  Recent Labs  06/23/14 0845  LABPROT 14.8  INR 1.14   CMP     Component Value Date/Time   NA 140 06/24/2014 0511   K 3.6 06/24/2014 0511   CL 106 06/24/2014 0511   CO2 26 06/24/2014 0511   GLUCOSE 113* 06/24/2014 0511   BUN <5* 06/24/2014 0511   CREATININE 0.70 06/24/2014 0511   CALCIUM 9.3 06/24/2014 0511   PROT 6.6 06/23/2014 0626   ALBUMIN 2.8* 06/23/2014 0626   AST 14 06/23/2014 0626   ALT 11 06/23/2014 0626   ALKPHOS 52 06/23/2014 0626   BILITOT 0.7 06/23/2014 0626   GFRNONAA 85* 06/24/2014 0511   GFRAA >90 06/24/2014 0511   Lipase     Component Value Date/Time   LIPASE 22 06/22/2014 1709       Studies/Results: No results found.  Anti-infectives: Anti-infectives    Start     Dose/Rate Route Frequency Ordered Stop   06/22/14 2230  piperacillin-tazobactam (ZOSYN) IVPB 3.375 g     3.375 g 12.5 mL/hr over 240 Minutes Intravenous Every 8 hours 06/22/14 2221     06/22/14  2030  ciprofloxacin (CIPRO) IVPB 400 mg  Status:  Discontinued     400 mg 200 mL/hr over 60 Minutes Intravenous  Once 06/22/14 2029 06/22/14 2351   06/22/14 2030  metroNIDAZOLE (FLAGYL) IVPB 500 mg     500 mg 100 mL/hr over 60 Minutes Intravenous  Once 06/22/14 2029 06/22/14 2141       Assessment/Plan  1. Persistent diverticulitis with abscess  2. H/o TIA 3. Spinal stenosis  Plan: 1. IR has evaluated the CT scan and is unable to drain this phlegmon/abscess.  Slow prep to start today.  Will abort prep if increased pain or n/v.    2. Cont IV Zosyn D2  LOS: 2 days    Yvonne Espinoza 06/24/2014, 12:36 PM

## 2014-06-25 DIAGNOSIS — K5720 Diverticulitis of large intestine with perforation and abscess without bleeding: Principal | ICD-10-CM

## 2014-06-25 LAB — GLUCOSE, CAPILLARY: Glucose-Capillary: 98 mg/dL (ref 70–99)

## 2014-06-25 NOTE — Progress Notes (Signed)
  Subjective: Still with llq pain, tol prep  Objective: Vital signs in last 24 hours: Temp:  [98.2 F (36.8 C)-98.4 F (36.9 C)] 98.3 F (36.8 C) (02/21 0531) Pulse Rate:  [62-64] 62 (02/21 0531) Resp:  [18] 18 (02/21 0531) BP: (124-132)/(49-60) 132/60 mmHg (02/21 0531) SpO2:  [96 %-99 %] 97 % (02/21 0531) Last BM Date: 06/25/14  Intake/Output from previous day: 02/20 0701 - 02/21 0700 In: 1920 [I.V.:900; IV Piggyback:300] Out: -  Intake/Output this shift: Total I/O In: 240 [Other:240] Out: -   GI: soft llq tender to palpation  Lab Results:   Recent Labs  06/23/14 0626 06/24/14 0511  WBC 7.4 6.9  HGB 10.0* 10.1*  HCT 31.9* 32.0*  PLT 452* 459*   BMET  Recent Labs  06/23/14 0626 06/24/14 0511  NA 141 140  K 3.7 3.6  CL 104 106  CO2 34* 26  GLUCOSE 107* 113*  BUN <5* <5*  CREATININE 0.71 0.70  CALCIUM 9.0 9.3   PT/INR  Recent Labs  06/23/14 0845  LABPROT 14.8  INR 1.14   ABG No results for input(s): PHART, HCO3 in the last 72 hours.  Invalid input(s): PCO2, PO2  Studies/Results: No results found.  Anti-infectives: Anti-infectives    Start     Dose/Rate Route Frequency Ordered Stop   06/22/14 2230  piperacillin-tazobactam (ZOSYN) IVPB 3.375 g     3.375 g 12.5 mL/hr over 240 Minutes Intravenous Every 8 hours 06/22/14 2221     06/22/14 2030  ciprofloxacin (CIPRO) IVPB 400 mg  Status:  Discontinued     400 mg 200 mL/hr over 60 Minutes Intravenous  Once 06/22/14 2029 06/22/14 2351   06/22/14 2030  metroNIDAZOLE (FLAGYL) IVPB 500 mg     500 mg 100 mL/hr over 60 Minutes Intravenous  Once 06/22/14 2029 06/22/14 2141      Assessment/Plan: Sigmoid diverticulitis  Still with pain, tolerating prep. Agree with Dr Janee Morn that she will need surgery this admission Would be ideal to do single stage or with ileostomy. I discussed with her sigmoid colectomy which certainly could be lap assisted.  Discussed end colostomy, primary anastomosis, and  primary anastomosis with ileostomy.  This would be intraoperative decision. Surgery will be planned by inpatient surgeon this week.  Cont npo.  Will have marked in am for stomas if needed  [facility] 06/25/2014

## 2014-06-25 NOTE — Progress Notes (Signed)
PROGRESS NOTE  Yvonne Espinoza XAJ:[id number] DOB: Sep 13, 1940 DOA: 06/22/2014 PCP: Lupe Carney, MD  Assessment/Plan: 1. 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 -last CT showed no abscess now she has an abscess- done in [place] - IV antibiotics started on zosyn  -surgery consult- area too small for IR to drain - c diff negative  2. Anemia -likely from chronic bleeding from her diverticulitis -monitor  3. Arthritis -she states that she has stopped taking methotrexate and has been doing well -will monitor  Code Status: full Family Communication: patient Disposition Plan:    Consultants:  Surgery  IR  Procedures:     HPI/Subjective: In bathroom doing clean out- per nursing stools still yellowish with flecks of stool  Objective: Filed Vitals:   06/25/14 0531  BP: 132/60  Pulse: 62  Temp: 98.3 F (36.8 C)  Resp: 18    Intake/Output Summary (Last 24 hours) at 06/25/14 0809 Last data filed at 06/25/14 0600  Gross per 24 hour  Intake   1920 ml  Output      0 ml  Net   1920 ml   Filed Weights   06/22/14 1703 06/22/14 2216  Weight: 52.118 kg (114 lb 14.4 oz) 52.7 kg (116 lb 2.9 oz)    Exam:   General:  A+Ox3, NAD  Data Reviewed: Basic Metabolic Panel:  Recent Labs Lab 06/22/14 1709 06/23/14 0626 06/24/14 0511  NA 137 141 140  K 3.8 3.7 3.6  CL 100 104 106  CO2 29 34* 26  GLUCOSE 93 107* 113*  BUN 8 <5* <5*  CREATININE 0.66 0.71 0.70  CALCIUM 9.2 9.0 9.3   Liver Function Tests:  Recent Labs Lab 06/22/14 1709 06/23/14 0626  AST 19 14  ALT 13 11  ALKPHOS 63 52  BILITOT 0.6 0.7  PROT 7.6 6.6  ALBUMIN 3.3* 2.8*    Recent Labs Lab 06/22/14 1709  LIPASE 22   No results for input(s): AMMONIA in the last 168 hours. CBC:  Recent Labs Lab 06/22/14 1709 06/23/14 0626 06/24/14 0511  WBC 9.6 7.4 6.9  NEUTROABS 6.2  --   --   HGB 10.6* 10.0* 10.1*  HCT 32.9* 31.9* 32.0*   MCV 85.2 85.8 85.3  PLT 537* 452* 459*   Cardiac Enzymes: No results for input(s): CKTOTAL, CKMB, CKMBINDEX, TROPONINI in the last 168 hours. BNP (last 3 results) No results for input(s): BNP in the last 8760 hours.  ProBNP (last 3 results) No results for input(s): PROBNP in the last 8760 hours.  CBG:  Recent Labs Lab 06/23/14 0730 06/24/14 0747 06/25/14 0743  GLUCAP 95 105* 98    Recent Results (from the past 240 hour(s))  Clostridium Difficile by PCR     Status: None   Collection Time: 06/23/14  7:32 AM  Result Value Ref Range Status   C difficile by pcr NEGATIVE NEGATIVE Final     Studies: No results found.  Scheduled Meds: . folic acid  1 mg Oral Daily  . heparin  5,000 Units Subcutaneous 3 times per day  . piperacillin-tazobactam (ZOSYN)  IV  3.375 g Intravenous Q8H   Continuous Infusions: . dextrose 5 % and 0.45% NaCl 50 mL/hr at 06/24/14 2257   Antibiotics Given (last 72 hours)    Date/Time Action Medication Dose Rate   06/22/14 2251 Given   ciprofloxacin (CIPRO) IVPB 400 mg 400 mg 200 mL/hr   06/22/14 2329 Given   piperacillin-tazobactam (ZOSYN) IVPB 3.375  g 3.375 g 12.5 mL/hr   06/23/14 0631 Given   piperacillin-tazobactam (ZOSYN) IVPB 3.375 g 3.375 g 12.5 mL/hr   06/23/14 1542 Given   piperacillin-tazobactam (ZOSYN) IVPB 3.375 g 3.375 g 12.5 mL/hr   06/23/14 2206 Given   piperacillin-tazobactam (ZOSYN) IVPB 3.375 g 3.375 g 12.5 mL/hr   06/24/14 0700 Given   piperacillin-tazobactam (ZOSYN) IVPB 3.375 g 3.375 g 12.5 mL/hr   06/24/14 1335 Given   piperacillin-tazobactam (ZOSYN) IVPB 3.375 g 3.375 g 12.5 mL/hr   06/25/14 0200 Given   piperacillin-tazobactam (ZOSYN) IVPB 3.375 g 3.375 g 12.5 mL/hr   06/25/14 0721 Given   piperacillin-tazobactam (ZOSYN) IVPB 3.375 g 3.375 g 12.5 mL/hr      Active Problems:   Diverticulitis   Diverticulitis of intestine with abscess    Time spent: 15 min    Castor Gittleman  [facility] Pager  [phone]. If 7PM-7AM, please contact night-coverage at [email or web address], password [facility] 06/25/2014, 8:09 AM  LOS: 3 days

## 2014-06-26 ENCOUNTER — Encounter (HOSPITAL_COMMUNITY): Admission: EM | Disposition: A | Discharge status: Home Health | Payer: Self-pay | Source: Home / Self Care

## 2014-06-26 ENCOUNTER — Inpatient Hospital Stay (HOSPITAL_COMMUNITY): Payer: Medicare Other | Admitting: Certified Registered"

## 2014-06-26 ENCOUNTER — Encounter (HOSPITAL_COMMUNITY): Payer: Self-pay | Admitting: Certified Registered Nurse Anesthetist

## 2014-06-26 DIAGNOSIS — D72829 Elevated white blood cell count, unspecified: Secondary | ICD-10-CM

## 2014-06-26 DIAGNOSIS — E876 Hypokalemia: Secondary | ICD-10-CM

## 2014-06-26 HISTORY — PX: ILEOSTOMY: SHX1783

## 2014-06-26 HISTORY — PX: CYSTOSCOPY W/ URETERAL STENT PLACEMENT: SHX1429

## 2014-06-26 HISTORY — PX: COLOSTOMY REVISION: SHX5232

## 2014-06-26 LAB — CBC
HCT: 31 % — ABNORMAL LOW (ref 36.0–46.0)
HEMATOCRIT: 28.7 % — AB (ref 36.0–46.0)
HEMOGLOBIN: 9.9 g/dL — AB (ref 12.0–15.0)
Hemoglobin: 9.2 g/dL — ABNORMAL LOW (ref 12.0–15.0)
MCH: 27.1 pg (ref 26.0–34.0)
MCH: 27.2 pg (ref 26.0–34.0)
MCHC: 31.9 g/dL (ref 30.0–36.0)
MCHC: 32.1 g/dL (ref 30.0–36.0)
MCV: 84.9 fL (ref 78.0–100.0)
MCV: 84.9 fL (ref 78.0–100.0)
Platelets: 449 10*3/uL — ABNORMAL HIGH (ref 150–400)
Platelets: 417 10*3/uL — ABNORMAL HIGH (ref 150–400)
RBC: 3.65 MIL/uL — ABNORMAL LOW (ref 3.87–5.11)
RBC: 3.38 MIL/uL — ABNORMAL LOW (ref 3.87–5.11)
RDW: 16.8 % — ABNORMAL HIGH (ref 11.5–15.5)
RDW: 17 % — AB (ref 11.5–15.5)
WBC: 7.8 10*3/uL (ref 4.0–10.5)
WBC: 17.3 10*3/uL — ABNORMAL HIGH (ref 4.0–10.5)

## 2014-06-26 LAB — POCT I-STAT EG7
Bicarbonate: 24.7 mEq/L — ABNORMAL HIGH (ref 20.0–24.0)
Calcium, Ion: 1.16 mmol/L (ref 1.13–1.30)
HCT: 36 % (ref 36.0–46.0)
Hemoglobin: 12.2 g/dL (ref 12.0–15.0)
O2 Saturation: 57 %
PO2 VEN: 31 mmHg (ref 30.0–45.0)
Patient temperature: 37.5
Potassium: 3.6 mmol/L (ref 3.5–5.1)
SODIUM: 142 mmol/L (ref 135–145)
TCO2: 26 mmol/L (ref 0–100)
pCO2, Ven: 42.1 mmHg — ABNORMAL LOW (ref 45.0–50.0)
pH, Ven: 7.378 — ABNORMAL HIGH (ref 7.250–7.300)

## 2014-06-26 LAB — BASIC METABOLIC PANEL
Anion gap: 8 (ref 5–15)
BUN: <5 mg/dL — ABNORMAL LOW (ref 6–23)
CHLORIDE: 108 mmol/L (ref 96–112)
CO2: 24 mmol/L (ref 19–32)
Calcium: 8.8 mg/dL (ref 8.4–10.5)
Creatinine, Ser: 0.64 mg/dL (ref 0.50–1.10)
GFR calc Af Amer: >90 mL/min (ref >90)
GFR, EST NON AFRICAN AMERICAN: 87 mL/min — AB (ref >90)
Glucose, Bld: 92 mg/dL (ref 70–99)
Potassium: 3.2 mmol/L — ABNORMAL LOW (ref 3.5–5.1)
Sodium: 140 mmol/L (ref 135–145)

## 2014-06-26 LAB — ABO/RH: ABO/RH(D): O POS

## 2014-06-26 LAB — GLUCOSE, CAPILLARY: Glucose-Capillary: 106 mg/dL — ABNORMAL HIGH (ref 70–99)

## 2014-06-26 SURGERY — CREATION, ILEOSTOMY
Anesthesia: General | Site: Ureter

## 2014-06-26 MED ORDER — HYDROMORPHONE HCL 1 MG/ML IJ SOLN
INTRAMUSCULAR | Status: AC
  Administered 2014-06-26: 0.5 mg via INTRAVENOUS
  Filled 2014-06-26: qty 2

## 2014-06-26 MED ORDER — SODIUM CHLORIDE 0.9 % IJ SOLN
INTRAMUSCULAR | Status: AC
  Filled 2014-06-26: qty 10

## 2014-06-26 MED ORDER — POVIDONE-IODINE 10 % EX OINT
TOPICAL_OINTMENT | CUTANEOUS | Status: DC | PRN
  Administered 2014-06-26: 1 via TOPICAL

## 2014-06-26 MED ORDER — FENTANYL CITRATE 0.05 MG/ML IJ SOLN
INTRAMUSCULAR | Status: AC
  Filled 2014-06-26: qty 5

## 2014-06-26 MED ORDER — KCL IN DEXTROSE-NACL 20-5-0.45 MEQ/L-%-% IV SOLN
INTRAVENOUS | Status: DC
  Administered 2014-06-26 – 2014-06-29 (×10): via INTRAVENOUS
  Administered 2014-06-30: 125 mL/h via INTRAVENOUS
  Administered 2014-06-30 – 2014-07-03 (×5): via INTRAVENOUS
  Filled 2014-06-26 (×22): qty 1000

## 2014-06-26 MED ORDER — PROPOFOL 10 MG/ML IV BOLUS
INTRAVENOUS | Status: AC
  Filled 2014-06-26: qty 20

## 2014-06-26 MED ORDER — SODIUM CHLORIDE 0.9 % IV SOLN
500.0000 mL | Freq: Once | INTRAVENOUS | Status: AC
  Administered 2014-06-27: 500 mL via INTRAVENOUS

## 2014-06-26 MED ORDER — PROPOFOL 10 MG/ML IV BOLUS
INTRAVENOUS | Status: AC
Start: 2014-06-26 — End: 2014-06-26
  Filled 2014-06-26: qty 20

## 2014-06-26 MED ORDER — LIDOCAINE HCL (CARDIAC) 20 MG/ML IV SOLN
INTRAVENOUS | Status: DC | PRN
  Administered 2014-06-26: 60 mg via INTRAVENOUS

## 2014-06-26 MED ORDER — ROCURONIUM BROMIDE 100 MG/10ML IV SOLN
INTRAVENOUS | Status: DC | PRN
  Administered 2014-06-26: 40 mg via INTRAVENOUS

## 2014-06-26 MED ORDER — ALBUMIN HUMAN 5 % IV SOLN
INTRAVENOUS | Status: DC | PRN
  Administered 2014-06-26: 16:00:00 via INTRAVENOUS

## 2014-06-26 MED ORDER — OXYCODONE HCL 5 MG/5ML PO SOLN: 5.0000 mg | Freq: Once | ORAL | Status: DC | PRN

## 2014-06-26 MED ORDER — VECURONIUM BROMIDE 10 MG IV SOLR
INTRAVENOUS | Status: DC | PRN
  Administered 2014-06-26 (×4): 1 mg via INTRAVENOUS

## 2014-06-26 MED ORDER — ACETAMINOPHEN 160 MG/5ML PO SOLN
325.0000 mg | ORAL | Status: DC | PRN
  Filled 2014-06-26: qty 20.3

## 2014-06-26 MED ORDER — FENTANYL CITRATE 0.05 MG/ML IJ SOLN
INTRAMUSCULAR | Status: DC | PRN
  Administered 2014-06-26 (×3): 50 ug via INTRAVENOUS
  Administered 2014-06-26: 150 ug via INTRAVENOUS
  Administered 2014-06-26 (×7): 50 ug via INTRAVENOUS

## 2014-06-26 MED ORDER — METHYLENE BLUE 1 % INJ SOLN
INTRAMUSCULAR | Status: AC
  Filled 2014-06-26: qty 10

## 2014-06-26 MED ORDER — HYDROMORPHONE HCL 1 MG/ML IJ SOLN
0.2500 mg | INTRAMUSCULAR | Status: DC | PRN
  Administered 2014-06-26 (×2): 0.5 mg via INTRAVENOUS

## 2014-06-26 MED ORDER — MIDAZOLAM HCL 5 MG/5ML IJ SOLN
INTRAMUSCULAR | Status: DC | PRN
  Administered 2014-06-26: 2 mg via INTRAVENOUS

## 2014-06-26 MED ORDER — PHENYLEPHRINE HCL 10 MG/ML IJ SOLN
INTRAMUSCULAR | Status: DC | PRN
  Administered 2014-06-26: 40 ug via INTRAVENOUS
  Administered 2014-06-26: 80 ug via INTRAVENOUS

## 2014-06-26 MED ORDER — 0.9 % SODIUM CHLORIDE (POUR BTL) OPTIME
TOPICAL | Status: DC | PRN
  Administered 2014-06-26 (×3): 1000 mL

## 2014-06-26 MED ORDER — MIDAZOLAM HCL 2 MG/2ML IJ SOLN
INTRAMUSCULAR | Status: AC
  Filled 2014-06-26: qty 2

## 2014-06-26 MED ORDER — LACTATED RINGERS IV SOLN
INTRAVENOUS | Status: DC | PRN
  Administered 2014-06-26 (×3): via INTRAVENOUS

## 2014-06-26 MED ORDER — PROPOFOL 10 MG/ML IV BOLUS
INTRAVENOUS | Status: DC | PRN
  Administered 2014-06-26: 110 mg via INTRAVENOUS

## 2014-06-26 MED ORDER — POVIDONE-IODINE 10 % EX OINT
TOPICAL_OINTMENT | CUTANEOUS | Status: AC
  Filled 2014-06-26: qty 28.35

## 2014-06-26 MED ORDER — NEOSTIGMINE METHYLSULFATE 10 MG/10ML IV SOLN
INTRAVENOUS | Status: DC | PRN
  Administered 2014-06-26: 3 mg via INTRAVENOUS

## 2014-06-26 MED ORDER — ACETAMINOPHEN 325 MG PO TABS: 325.0000 mg | ORAL_TABLET | ORAL | Status: DC | PRN

## 2014-06-26 MED ORDER — OXYCODONE HCL 5 MG PO TABS: 5.0000 mg | ORAL_TABLET | Freq: Once | ORAL | Status: DC | PRN

## 2014-06-26 MED ORDER — GLYCOPYRROLATE 0.2 MG/ML IJ SOLN
INTRAMUSCULAR | Status: DC | PRN
  Administered 2014-06-26: .4 mg via INTRAVENOUS

## 2014-06-26 MED ORDER — HYDROMORPHONE HCL 1 MG/ML IJ SOLN
0.5000 mg | INTRAMUSCULAR | Status: DC | PRN
  Administered 2014-06-27 – 2014-07-03 (×15): 1 mg via INTRAVENOUS
  Filled 2014-06-26 (×15): qty 1

## 2014-06-26 MED ORDER — ONDANSETRON HCL 4 MG/2ML IJ SOLN
INTRAMUSCULAR | Status: DC | PRN
  Administered 2014-06-26: 4 mg via INTRAVENOUS

## 2014-06-26 MED ORDER — LACTATED RINGERS IV SOLN
INTRAVENOUS | Status: DC
  Administered 2014-06-26: 12:00:00 via INTRAVENOUS

## 2014-06-26 SURGICAL SUPPLY — 71 items
CANISTER SUCTION 2500CC (MISCELLANEOUS) ×4 IMPLANT
CATH FOLEY 2WAY SLVR 18FR 30CC (CATHETERS) ×4 IMPLANT
CATH URET WHISTLE 8FR 331008 (CATHETERS) ×4 IMPLANT
CHLORAPREP W/TINT 26ML (MISCELLANEOUS) ×4 IMPLANT
COVER MAYO STAND STRL (DRAPES) ×12 IMPLANT
COVER SURGICAL LIGHT HANDLE (MISCELLANEOUS) ×8 IMPLANT
DRAPE LAPAROSCOPIC ABDOMINAL (DRAPES) ×4 IMPLANT
DRAPE PROXIMA HALF (DRAPES) ×8 IMPLANT
DRAPE UTILITY XL STRL (DRAPES) ×4 IMPLANT
DRAPE WARM FLUID 44X44 (DRAPE) ×4 IMPLANT
DRSG OPSITE POSTOP 4X10 (GAUZE/BANDAGES/DRESSINGS) ×4 IMPLANT
ELECT BLADE 6.5 EXT (BLADE) ×4 IMPLANT
ELECT CAUTERY BLADE 6.4 (BLADE) ×8 IMPLANT
ELECT REM PT RETURN 9FT ADLT (ELECTROSURGICAL) ×4
ELECTRODE REM PT RTRN 9FT ADLT (ELECTROSURGICAL) ×3 IMPLANT
EVACUATOR SILICONE 100CC (DRAIN) ×4 IMPLANT
GLOVE BIO SURGEON STRL SZ 6.5 (GLOVE) ×4 IMPLANT
GLOVE BIO SURGEON STRL SZ8 (GLOVE) ×8 IMPLANT
GLOVE BIOGEL PI IND STRL 6.5 (GLOVE) ×3 IMPLANT
GLOVE BIOGEL PI IND STRL 7.0 (GLOVE) ×6 IMPLANT
GLOVE BIOGEL PI IND STRL 8 (GLOVE) ×15 IMPLANT
GLOVE BIOGEL PI INDICATOR 6.5 (GLOVE) ×1
GLOVE BIOGEL PI INDICATOR 7.0 (GLOVE) ×2
GLOVE BIOGEL PI INDICATOR 8 (GLOVE) ×5
GLOVE ECLIPSE 7.5 STRL STRAW (GLOVE) ×8 IMPLANT
GLOVE SURG SS PI 6.5 STRL IVOR (GLOVE) ×8 IMPLANT
GOWN STRL REUS W/ TWL LRG LVL3 (GOWN DISPOSABLE) ×9 IMPLANT
GOWN STRL REUS W/TWL LRG LVL3 (GOWN DISPOSABLE) ×3
GUIDEWIRE STR DUAL SENSOR (WIRE) ×4 IMPLANT
KIT BASIN OR (CUSTOM PROCEDURE TRAY) ×4 IMPLANT
KIT OSTOMY DRAINABLE 2.75 STR (WOUND CARE) ×4 IMPLANT
KIT ROOM TURNOVER OR (KITS) ×4 IMPLANT
LEGGING LITHOTOMY PAIR STRL (DRAPES) ×4 IMPLANT
LIGASURE IMPACT 36 18CM CVD LR (INSTRUMENTS) ×4 IMPLANT
MARKER SKIN DUAL TIP RULER LAB (MISCELLANEOUS) ×4 IMPLANT
NEEDLE 18GX1X1/2 (RX/OR ONLY) (NEEDLE) ×4 IMPLANT
NEEDLE HYPO 25GX1X1/2 BEV (NEEDLE) ×4 IMPLANT
NS IRRIG 1000ML POUR BTL (IV SOLUTION) ×8 IMPLANT
PACK GENERAL/GYN (CUSTOM PROCEDURE TRAY) ×4 IMPLANT
PAD ARMBOARD 7.5X6 YLW CONV (MISCELLANEOUS) ×4 IMPLANT
PENCIL BUTTON HOLSTER BLD 10FT (ELECTRODE) ×4 IMPLANT
SPECIMEN JAR X LARGE (MISCELLANEOUS) ×4 IMPLANT
SPONGE LAP 18X18 X RAY DECT (DISPOSABLE) ×8 IMPLANT
STAPLER CUT CVD 40MM BLUE (STAPLE) ×4 IMPLANT
STAPLER PROXIMATE 75MM BLUE (STAPLE) ×4 IMPLANT
STAPLER VISISTAT 35W (STAPLE) ×4 IMPLANT
STENT CONTOUR 8FR X 24 (STENTS) ×4 IMPLANT
SUCTION POOLE TIP (SUCTIONS) ×4 IMPLANT
SURGILUBE 2OZ TUBE FLIPTOP (MISCELLANEOUS) ×8 IMPLANT
SUT ETHILON 2 0 FS 18 (SUTURE) ×4 IMPLANT
SUT PDS AB 1 TP1 96 (SUTURE) ×8 IMPLANT
SUT PDS AB 4-0 RB1 27 (SUTURE) ×8 IMPLANT
SUT PROLENE 2 0 CT 30 (SUTURE) ×4 IMPLANT
SUT PROLENE 2 0 CT2 30 (SUTURE) IMPLANT
SUT PROLENE 2 0 KS (SUTURE) IMPLANT
SUT SILK 2 0 SH CR/8 (SUTURE) ×4 IMPLANT
SUT SILK 2 0 TIES 10X30 (SUTURE) ×4 IMPLANT
SUT SILK 3 0 SH CR/8 (SUTURE) ×4 IMPLANT
SUT SILK 3 0 TIES 10X30 (SUTURE) ×4 IMPLANT
SUT VIC AB 2-0 SH 18 (SUTURE) ×4 IMPLANT
SUT VIC AB 2-0 SH 27 (SUTURE) ×6
SUT VIC AB 2-0 SH 27X BRD (SUTURE) ×6 IMPLANT
SUT VIC AB 2-0 SH 27XBRD (SUTURE) ×12 IMPLANT
SUT VIC AB 3-0 SH 18 (SUTURE) ×4 IMPLANT
SYR BULB IRRIGATION 50ML (SYRINGE) ×8 IMPLANT
SYR CONTROL 10ML LL (SYRINGE) ×4 IMPLANT
TOWEL OR 17X26 10 PK STRL BLUE (TOWEL DISPOSABLE) ×8 IMPLANT
TRAY FOLEY CATH 14FRSI W/METER (CATHETERS) IMPLANT
TRAY PROCTOSCOPIC FIBER OPTIC (SET/KITS/TRAYS/PACK) IMPLANT
TUBE CONNECTING 12X1/4 (SUCTIONS) ×8 IMPLANT
YANKAUER SUCT BULB TIP NO VENT (SUCTIONS) ×4 IMPLANT

## 2014-06-26 NOTE — Progress Notes (Signed)
Patient not very tender, tolerated her bowel prep very well.  Pretty clean, but still cannot commit to anastomosis without a colostomy.  CPM.  To OR today.  Likely early afternoon.  Marta Lamas. Gae Bon, MD, FACS [phone] [facility] Surgery

## 2014-06-26 NOTE — Anesthesia Procedure Notes (Signed)
Procedure Name: Intubation Date/Time: 06/26/2014 1:35 PM Performed by: Dairl Ponder Pre-anesthesia Checklist: Patient identified, Timeout performed, Emergency Drugs available, Suction available and Patient being monitored Patient Re-evaluated:Patient Re-evaluated prior to inductionOxygen Delivery Method: Circle system utilized Preoxygenation: Pre-oxygenation with 100% oxygen Intubation Type: IV induction Ventilation: Mask ventilation without difficulty Laryngoscope Size: Mac and 3 Grade View: Grade I Tube type: Oral Tube size: 7.0 mm Number of attempts: 1 Placement Confirmation: ETT inserted through vocal cords under direct vision,  breath sounds checked- equal and bilateral and positive ETCO2 Secured at: 22 cm Tube secured with: Tape Dental Injury: Teeth and Oropharynx as per pre-operative assessment

## 2014-06-26 NOTE — Anesthesia Postprocedure Evaluation (Signed)
Anesthesia Post Note  Patient: Yvonne Espinoza  Procedure(s) Performed: Procedure(s) (LRB): BOARI FLAP LEFT URETERAL REIMPLANT (Left) LOOP ILEOSTOMY (N/A) SIGMOID COLECTOMY WITH BLADDER REPAIR (N/A)  Anesthesia type: general  Patient location: PACU  Post pain: Pain level controlled  Post assessment: Patient's Cardiovascular Status Stable  Last Vitals:  Filed Vitals:   06/26/14 1930  BP: 129/45  Pulse: 94  Temp: 36.3 C  Resp: 16    Post vital signs: Reviewed and stable  Level of consciousness: sedated  Complications: No apparent anesthesia complications

## 2014-06-26 NOTE — Anesthesia Preprocedure Evaluation (Addendum)
Anesthesia Evaluation  Patient identified by MRN, date of birth, ID band Patient awake    Reviewed: Allergy & Precautions, NPO status , Patient's Chart, lab work & pertinent test results  History of Anesthesia Complications Negative for: history of anesthetic complications  Airway Mallampati: II  TM Distance: >3 FB Neck ROM: Full    Dental  (+) Edentulous Upper   Pulmonary neg pulmonary ROS,  breath sounds clear to auscultation        Cardiovascular negative cardio ROS  Rhythm:Regular     Neuro/Psych TIAnegative psych ROS   GI/Hepatic GERD-  Controlled,  Endo/Other    Renal/GU      Musculoskeletal  (+) Arthritis -, Osteoarthritis,    Abdominal (+)  Abdomen: soft.    Peds  Hematology   Anesthesia Other Findings   Reproductive/Obstetrics                            Anesthesia Physical Anesthesia Plan  ASA: III  Anesthesia Plan: General   Post-op Pain Management:    Induction: Intravenous  Airway Management Planned: Oral ETT  Additional Equipment: None  Intra-op Plan:   Post-operative Plan: Extubation in OR  Informed Consent: I have reviewed the patients History and Physical, chart, labs and discussed the procedure including the risks, benefits and alternatives for the proposed anesthesia with the patient or authorized representative who has indicated his/her understanding and acceptance.   Dental advisory given  Plan Discussed with: Surgeon and CRNA  Anesthesia Plan Comments:        Anesthesia Quick Evaluation

## 2014-06-26 NOTE — Op Note (Signed)
OPERATIVE REPORT  DATE OF OPERATION: 06/26/2014  PATIENT:  Yvonne Espinoza  74 y.o. female  PRE-OPERATIVE DIAGNOSIS:  Sigmoid diverticulitis  POST-OPERATIVE DIAGNOSIS:  Sigmoid diverticulitis with colo-vesicle fistula, left ureteral transection  PROCEDURE:  Procedure(s): BOARI FLAP LEFT URETERAL REIMPLANT LOOP ILEOSTOMY SIGMOID COLECTOMY  BLADDER REPAIR  SURGEON:  Surgeon(s): Frederik Schmidt, MD Violeta Gelinas, MD Chelsea Aus, MD Axel Filler, MD  ASSISTANT: Janee Morn, M.D., [place], PA-C  ANESTHESIA:   general  EBL: 300 ml  BLOOD ADMINISTERED: none  DRAINS: Urinary Catheter (Foley) and (7mm) Blake drain(s) in the pelvic and pubic area   SPECIMEN:  Source of Specimen:  Sigmoid colon  COUNTS CORRECT:  YES  PROCEDURE DETAILS: The patient was taken to the operating room and placed on the table in the supine position. After an adequate general endotracheal anesthetic was administered she was placed in lithotomy and prepped and draped in the perineum and perianal area along with the abdomen in the usual sterile manner.  A proper timeout was performed identifying the patient and procedure to be performed. A midline incision was made using a #10 blade and taken down to and through the midline fascia down to the pubis. We went down through the midline fascia using electrocautery. Once we were in the peritoneal cavity we placed the patient in Trendelenburg position and a Balfour retractor was placed inside with a bladder blade.  The markedly inflamed sigmoid colon was without a known abscess cavity. It was densely adhered to the bladder and upon dissecting away there was a fistulous tract between the sigmoid colon and the bladder. Once we dissected it away there was a 3 cm defect in the dome of the bladder which was repaired in 2 layers using running 2-0 Vicryl sutures.  Once the bladder was repaired the significant amount of inflammation that was on the left side was densely  adhered to the pelvis inferiorly and posteriorly. We dissected away from the pelvic structures however the left distal ureter was densely adhered to and incorporated into the inflammatory process on the left side. In the process of mobilizing the sigmoid colon on the left side, a small segment of the ureter was resected with the specimen inadvertently, and the distal and the proximal ureter was left in place. This was subsequently repaired by the urologist on call with a separately dictated operative report.  This injury was noted immediately at the time of surgery.  Prior to the ureter being repaired we came across the proximal sigmoid colon using a GIA-75 stapler. We were able to separate the inflammatory mass from the back wall of the uterus without injuring it or the gonadal structures. We subsequently were able to get down to the distal sigmoid and proximal rectum and came across that with a Contour stapling device.  We subsequently passed a pursestring suture around the proximal descending colon which been mobilized along the line of Toldt. We used an EEA sizer in order to determine that a 29 mm premium EEA would be used for the anastomosis. The assistant went to the anus/perineum and passed the stapling device.  We opened the stapling trocar and came out through the staple line distally as we attached the anvil to the proximal descending colon using the pursestring suture. An anastomosis was made which had 2 very good doughnuts and no leakage of air when insufflated and clamped. Because of the prolonged nature of the procedure and the anastomosis being in an inflamed area a diverting loop ileostomy was brought  out the right side and matured using 3-0 Vicryl sutures. This was done after closure and repair of the left ureter.  Once all intra-abdominal procedures have been completed the surgeon and assistant changed gowns and gloves. We closed the abdomen using running looped #1 PDS suture. A Blake drain  was placed in the pelvic and the area anterior to the bladder and brought out the left lower quadrant area. It was secured in place with 2-0 nylon suture. We irrigated skep subcutaneous tissue with saline then closed the skin using stainless steel staples. An ostomy bag appliance was applied to the loop ileostomy. All counts were correct including needles, sponges, and instruments.  PATIENT DISPOSITION:  PACU - hemodynamically stable.   Frederik Schmidt 2/22/20166:35 PM

## 2014-06-26 NOTE — Consult Note (Addendum)
WOC ostomy consult note Consult requested for possible stoma site marking during impending surgery. Assessed abd while lying and sitting upright.  Pt has folds located above and below the marking site which should be avoided if possible. Requested to mark for ileostomy and colostomy.  Mark placed 6 cm to the left and right of the umbilicus, and 3 cm below, to areas free from folds, in line of vision, within rectus muscles.  Briefly discussed pouching routines and demonstrated pouch appearance.  Educational materials left at bedside. Daughter in room and patient asks appropriate questions.  Will plan to follow post-op for pouching educational sessions. Cammie Mcgee MSN, RN, CWOCN, [place], CNS [phone]

## 2014-06-26 NOTE — Progress Notes (Signed)
PROGRESS NOTE  Yvonne Espinoza VEH:[id number] DOB: 1940-08-04 DOA: 06/22/2014 PCP: Lupe Carney, MD  Assessment/Plan: 1. 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 -last CT showed no abscess now she has an abscess- done in [place] - IV antibiotics started on zosyn  -surgery consult- area too small for IR to drain - c diff negative -plan for OR today  2. Anemia -likely from chronic bleeding from her diverticulitis -monitor  3. Arthritis -she states that she has stopped taking methotrexate and has been doing well -will monitor  Code Status: full Family Communication: patient Disposition Plan:    Consultants:  Surgery  IR  Procedures:     HPI/Subjective: No complaints  Objective: Filed Vitals:   06/26/14 0558  BP: 136/64  Pulse: 73  Temp: 98.1 F (36.7 C)  Resp: 18    Intake/Output Summary (Last 24 hours) at 06/26/14 0843 Last data filed at 06/25/14 1800  Gross per 24 hour  Intake    960 ml  Output      0 ml  Net    960 ml   Filed Weights   06/22/14 1703 06/22/14 2216  Weight: 52.118 kg (114 lb 14.4 oz) 52.7 kg (116 lb 2.9 oz)    Exam:   General:  A+Ox3, NAD  GI: + BS  Data Reviewed: Basic Metabolic Panel:  Recent Labs Lab 06/22/14 1709 06/23/14 0626 06/24/14 0511 06/26/14 0544  NA 137 141 140 140  K 3.8 3.7 3.6 3.2*  CL 100 104 106 108  CO2 29 34* 26 24  GLUCOSE 93 107* 113* 92  BUN 8 <5* <5* <5*  CREATININE 0.66 0.71 0.70 0.64  CALCIUM 9.2 9.0 9.3 8.8   Liver Function Tests:  Recent Labs Lab 06/22/14 1709 06/23/14 0626  AST 19 14  ALT 13 11  ALKPHOS 63 52  BILITOT 0.6 0.7  PROT 7.6 6.6  ALBUMIN 3.3* 2.8*    Recent Labs Lab 06/22/14 1709  LIPASE 22   No results for input(s): AMMONIA in the last 168 hours. CBC:  Recent Labs Lab 06/22/14 1709 06/23/14 0626 06/24/14 0511 06/26/14 0544  WBC 9.6 7.4 6.9 7.8  NEUTROABS 6.2  --   --   --   HGB 10.6*  10.0* 10.1* 9.9*  HCT 32.9* 31.9* 32.0* 31.0*  MCV 85.2 85.8 85.3 84.9  PLT 537* 452* 459* 449*   Cardiac Enzymes: No results for input(s): CKTOTAL, CKMB, CKMBINDEX, TROPONINI in the last 168 hours. BNP (last 3 results) No results for input(s): BNP in the last 8760 hours.  ProBNP (last 3 results) No results for input(s): PROBNP in the last 8760 hours.  CBG:  Recent Labs Lab 06/23/14 0730 06/24/14 0747 06/25/14 0743 06/26/14 0736  GLUCAP 95 105* 98 106*    Recent Results (from the past 240 hour(s))  Clostridium Difficile by PCR     Status: None   Collection Time: 06/23/14  7:32 AM  Result Value Ref Range Status   C difficile by pcr NEGATIVE NEGATIVE Final     Studies: No results found.  Scheduled Meds: . folic acid  1 mg Oral Daily  . heparin  5,000 Units Subcutaneous 3 times per day  . piperacillin-tazobactam (ZOSYN)  IV  3.375 g Intravenous Q8H   Continuous Infusions: . dextrose 5 % and 0.45% NaCl 50 mL/hr at 06/26/14 0540   Antibiotics Given (last 72 hours)    Date/Time Action Medication Dose Rate   06/23/14 1542 Given  piperacillin-tazobactam (ZOSYN) IVPB 3.375 g 3.375 g 12.5 mL/hr   06/23/14 2206 Given   piperacillin-tazobactam (ZOSYN) IVPB 3.375 g 3.375 g 12.5 mL/hr   06/24/14 0700 Given   piperacillin-tazobactam (ZOSYN) IVPB 3.375 g 3.375 g 12.5 mL/hr   06/24/14 1335 Given   piperacillin-tazobactam (ZOSYN) IVPB 3.375 g 3.375 g 12.5 mL/hr   06/25/14 0200 Given   piperacillin-tazobactam (ZOSYN) IVPB 3.375 g 3.375 g 12.5 mL/hr   06/25/14 0721 Given   piperacillin-tazobactam (ZOSYN) IVPB 3.375 g 3.375 g 12.5 mL/hr   06/25/14 1401 Given   piperacillin-tazobactam (ZOSYN) IVPB 3.375 g 3.375 g 12.5 mL/hr   06/25/14 2112 Given   piperacillin-tazobactam (ZOSYN) IVPB 3.375 g 3.375 g 12.5 mL/hr   06/26/14 0540 Given   piperacillin-tazobactam (ZOSYN) IVPB 3.375 g 3.375 g 12.5 mL/hr      Active Problems:   Diverticulitis   Diverticulitis of intestine with  abscess    Time spent: 15 min    Yvonne Espinoza  [facility] Pager [phone]. If 7PM-7AM, please contact night-coverage at [email or web address], password [facility] 06/26/2014, 8:43 AM  LOS: 4 days

## 2014-06-26 NOTE — Transfer of Care (Signed)
Immediate Anesthesia Transfer of Care Note  Patient: Yvonne Espinoza  Procedure(s) Performed: Procedure(s): BOARI FLAP LEFT URETERAL REIMPLANT (Left) LOOP ILEOSTOMY (N/A) SIGMOID COLECTOMY WITH BLADDER REPAIR (N/A)  Patient Location: PACU  Anesthesia Type:General  Level of Consciousness: awake  Airway & Oxygen Therapy: Patient Spontanous Breathing and Patient connected to face mask oxygen  Post-op Assessment: Report given to RN and Post -op Vital signs reviewed and stable  Post vital signs: Reviewed and stable  Last Vitals:  Filed Vitals:   06/26/14 1121  BP: 146/55  Pulse: 67  Temp: 36.9 C  Resp: 16    Complications: No apparent anesthesia complications

## 2014-06-26 NOTE — Consult Note (Signed)
Preoperative diagnosis: Sigmoid diverticulitis with bladder involvement, left ureteral injury  Postoperative diagnosis: Same   Procedure: Left ureteral reimplant with use of Boari flap    Surgeon: Bertram Millard. Kavi Almquist, M.D.   Assistant: Dr. Megan Mans  Anesthesia: Gen.   Complications: None  Specimen(s): None  Drain(s): 18 French Foley catheter, 24 cm x 8 French double-J stent without tether  Indications: I was called to the operating room by Dr. Megan Mans. The patient has a history of acute sigmoid diverticulitis. She was undergoing a sigmoid colon resection with low anterior anastomosis. Because of significant left pelvic/retroperitoneal mass secondary to the diverticulitis, and unintended injury to the left ureter occurred. This was noted by Dr. Lindie Spruce during the surgery, and urologic consultation requested.  Inspection of the left ureter revealed transection of the ureter down low, just above the uterus, adjacent to the atretic ovary. The distal ureter had been mobilized prior to my presentation in the emergency room. The ureter appeared viable.  Procedure:  After thorough inspection of the ureter, a stay suture of 3-0 silk was placed on the distal ureter. The bladder was filled from below through an 18 [place] Foley catheter until full. At first, it looked as if an anastomosis directly between the ureter and the bladder would be adequate. The stay stitch was removed from the ureter. The distal ureter was excised, and using Potts scissors, the ureter was spatulated for approximately 1 cm. I then made a cystotomy just at the dome of the bladder to the left of the midline. The fat had been clean from the dome of the bladder with sharp dissection. Using 4-0 PDS, sutures were placed at the heel and toe of the spatulated ureter. Anastomosis was started between the ureter and the bladder. Because of a bit of tension, the sutures pulled free from the ureter. At this point, I felt that a tension-free  anastomosis was impossible. I felt at this point that creating a Boari flap was necessary. I mobilized the right anterior bladder pedicle first, allowing a bit of mobilization of the bladder to the left. The Boari flap was then constructed from the dome of the bladder in an inferior and rightward direction. An incision into the bladder was carried for 3 cm in that direction. The width of the flap was approximately 1-1/2 cm. This allowed [place] to pull up flap that we could tubularize based on blood supply from the dome of the bladder. The incision in the bladder was then closed using 2 separate running sutures of 2-0 Vicryl. The Boari flap was then tubularized using the same 2-0 Vicryl, placed in a running simple fashion. Once the bladder incisions were closed, I then, over guidewire, passed a 24 cm x 8 French contour double-J stent through the tubularized flap quite easily. The guidewire was then passed up through the ureter to the renal pelvis. Over top of this, I then passed the tapered end of the contour stent. This easily passed, and then the guidewire was removed. The anastomosis between the distal ureter and the tubularized bladder was then performed. 2 separate sutures of 4-0 PDS were used to perform the anastomosis, each suture line encompassed half of the anastomosis.  Following completion of the anastomosis, the bladder was filled with sterile saline. 2-3 small leaks were noted, each of these were closed with 2-0 Vicryl sutures. At this point, with bladder filling, there was no evident leak. The Boari flap appeared viable. Additionally, the distal ureter appeared viable.  At this point,  a 19 [place] Blake drain was placed anteriorly over the bladder in the anastomosis. Her to placement of the drain, perivesical fat and peritoneum was reapproximated over the cystotomy as well as the anastomosis between the Boari flap tube and the ureter.  Dr. Lindie Spruce commenced with closure of the patient's abdomen at this  point.  I will follow her carefully postoperatively. I would recommend leaving the Foley catheter in 7-10 days, and prior to removal we will perform a cystogram. The double-J stent will need to be left in for several weeks. I will eventually remove that in the office. If there is significant drainage from the JP drain, the fluid can be sent for creatinine 2 in order to ascertain whether it is from peritoneal fluid or urine.  Following the procedure, I spoke with the patient's daughter.

## 2014-06-27 ENCOUNTER — Encounter (HOSPITAL_COMMUNITY): Payer: Self-pay | Admitting: General Surgery

## 2014-06-27 DIAGNOSIS — I959 Hypotension, unspecified: Secondary | ICD-10-CM

## 2014-06-27 LAB — BASIC METABOLIC PANEL
Anion gap: 3 — ABNORMAL LOW (ref 5–15)
BUN: <5 mg/dL — ABNORMAL LOW (ref 6–23)
CHLORIDE: 109 mmol/L (ref 96–112)
CO2: 28 mmol/L (ref 19–32)
Calcium: 8.1 mg/dL — ABNORMAL LOW (ref 8.4–10.5)
Creatinine, Ser: 0.66 mg/dL (ref 0.50–1.10)
GFR calc Af Amer: >90 mL/min (ref >90)
GFR calc non Af Amer: 86 mL/min — ABNORMAL LOW (ref >90)
GLUCOSE: 232 mg/dL — AB (ref 70–99)
POTASSIUM: 3.8 mmol/L (ref 3.5–5.1)
SODIUM: 140 mmol/L (ref 135–145)

## 2014-06-27 LAB — CBC WITH DIFFERENTIAL/PLATELET
BASOS ABS: 0 10*3/uL (ref 0.0–0.1)
BASOS PCT: 0 % (ref 0–1)
EOS ABS: 0 10*3/uL (ref 0.0–0.7)
EOS PCT: 0 % (ref 0–5)
HCT: 28.1 % — ABNORMAL LOW (ref 36.0–46.0)
Hemoglobin: 8.9 g/dL — ABNORMAL LOW (ref 12.0–15.0)
LYMPHS ABS: 1.3 10*3/uL (ref 0.7–4.0)
Lymphocytes Relative: 12 % (ref 12–46)
MCH: 27.1 pg (ref 26.0–34.0)
MCHC: 31.7 g/dL (ref 30.0–36.0)
MCV: 85.7 fL (ref 78.0–100.0)
MONOS PCT: 12 % (ref 3–12)
Monocytes Absolute: 1.3 10*3/uL — ABNORMAL HIGH (ref 0.1–1.0)
NEUTROS ABS: 8.2 10*3/uL — AB (ref 1.7–7.7)
NEUTROS PCT: 76 % (ref 43–77)
PLATELETS: 424 10*3/uL — AB (ref 150–400)
RBC: 3.28 MIL/uL — ABNORMAL LOW (ref 3.87–5.11)
RDW: 17.1 % — ABNORMAL HIGH (ref 11.5–15.5)
WBC: 10.8 10*3/uL — AB (ref 4.0–10.5)

## 2014-06-27 LAB — GLUCOSE, CAPILLARY: Glucose-Capillary: 233 mg/dL — ABNORMAL HIGH (ref 70–99)

## 2014-06-27 LAB — CBC
HEMATOCRIT: 29.1 % — AB (ref 36.0–46.0)
Hemoglobin: 9.5 g/dL — ABNORMAL LOW (ref 12.0–15.0)
MCH: 27.7 pg (ref 26.0–34.0)
MCHC: 32.6 g/dL (ref 30.0–36.0)
MCV: 84.8 fL (ref 78.0–100.0)
Platelets: 336 10*3/uL (ref 150–400)
RBC: 3.43 MIL/uL — ABNORMAL LOW (ref 3.87–5.11)
RDW: 16.4 % — AB (ref 11.5–15.5)
WBC: 12.8 10*3/uL — ABNORMAL HIGH (ref 4.0–10.5)

## 2014-06-27 LAB — HEMOGLOBIN AND HEMATOCRIT, BLOOD
HEMATOCRIT: 24.9 % — AB (ref 36.0–46.0)
Hemoglobin: 7.7 g/dL — ABNORMAL LOW (ref 12.0–15.0)

## 2014-06-27 LAB — PREPARE RBC (CROSSMATCH)

## 2014-06-27 MED ORDER — SODIUM CHLORIDE 0.9 % IV SOLN: INTRAVENOUS | Status: DC

## 2014-06-27 MED ORDER — SODIUM CHLORIDE 0.9 % IV SOLN
Freq: Once | INTRAVENOUS | Status: AC
  Administered 2014-06-27: 10 mL/h via INTRAVENOUS

## 2014-06-27 MED ORDER — SODIUM CHLORIDE 0.9 % IV BOLUS (SEPSIS)
500.0000 mL | Freq: Once | INTRAVENOUS | Status: AC
  Administered 2014-06-27: 500 mL via INTRAVENOUS

## 2014-06-27 MED ORDER — CETYLPYRIDINIUM CHLORIDE 0.05 % MT LIQD
7.0000 mL | Freq: Two times a day (BID) | OROMUCOSAL | Status: DC
Start: 2014-06-28 — End: 2014-07-01
  Administered 2014-06-28 – 2014-06-30 (×5): 7 mL via OROMUCOSAL

## 2014-06-27 MED ORDER — CHLORHEXIDINE GLUCONATE 0.12 % MT SOLN
15.0000 mL | Freq: Two times a day (BID) | OROMUCOSAL | Status: DC
  Administered 2014-06-27 – 2014-06-30 (×6): 15 mL via OROMUCOSAL
  Filled 2014-06-27 (×5): qty 15

## 2014-06-27 MED ORDER — METHOCARBAMOL 1000 MG/10ML IJ SOLN
1000.0000 mg | Freq: Three times a day (TID) | INTRAVENOUS | Status: DC
  Administered 2014-06-27 – 2014-07-01 (×13): 1000 mg via INTRAVENOUS
  Filled 2014-06-27 (×16): qty 10

## 2014-06-27 NOTE — Progress Notes (Signed)
Patients BP running low 98/24...Marland Kitchenrechecked manually and it was 96/32.  HR running in the 80s.  Notified Megan and Dr Lindie Spruce of BP and that the patient feeling light headed/blurry vision.  Pt had been given pain medicine recently.  Orders received for NS bolus and H/H labwork.  Will cont to monitor.

## 2014-06-27 NOTE — Consult Note (Addendum)
WOC ostomy follow up Stoma type/location: RUQ ileostomy  Stomal assessment/size: Stoma WNL, red in color; visualized through pouch which is intact with a good seal. Peristomal assessment: Incision noted to the midline abdomen covered with honeycomb gauze with JP drain in place; old bloody drainage noted to gauze but otherwise dressing appeared WNL. Output: Minimal brown/green liquid stool noted to bag. Ostomy pouching: 1pc.  Education provided: Ostomy placed yesterday by Dr. Lindie Spruce.  Pt denies discomfort at this time.  Pt very knowledgeable about the events of her surgery and seems eager to learn how to maintain her ostomy.  Ostomy without signs of leakage at this time; informed patient that we would provide ostomy education with her next ostomy pouch change when she is feeling better, she verbalized understanding.  Ostomy supplies ordered to bedside. Enrolled patient in [place] [employer] Discharge program: Not at this time  Carita Pian, BSN, RN-BC  Graduate Student  Cammie Mcgee MSN, RN, [place], [place], CNS [phone]

## 2014-06-27 NOTE — Progress Notes (Signed)
Hemoglobin has dropped from 8.9 to 7.7.  Will give one unit of blood.  Yvonne Espinoza. Gae Bon, MD, FACS [phone] [facility]

## 2014-06-27 NOTE — Progress Notes (Signed)
1 Day Post-Op Subjective: Patient reports that she is comfortable.  Objective: Vital signs in last 24 hours: Temp:  [97.3 F (36.3 C)-99.4 F (37.4 C)] 97.9 F (36.6 C) (02/23 0359) Pulse Rate:  [67-115] 107 (02/23 0359) Resp:  [16-21] 16 (02/23 0359) BP: (128-163)/(41-72) 143/57 mmHg (02/23 0359) SpO2:  [97 %-100 %] 100 % (02/23 0359)  Intake/Output from previous day: 02/22 0701 - 02/23 0700 In: 4106.5 [I.V.:3706.5; IV Piggyback:400] Out: 1760 [Urine:1175; Drains:185; Blood:400] Intake/Output this shift:    Physical Exam:  Constitutional: Vital signs reviewed. WD WN in NAD   Eyes: PERRL, No scleral icterus.   Pulmonary/Chest: Normal effort Extremities: No cyanosis or edema   Drain output 185 cc overnight  Lab Results:  Recent Labs  06/26/14 1619 06/26/14 2105 06/27/14 0516  HGB 12.2 9.2* 8.9*  HCT 36.0 28.7* 28.1*   BMET  Recent Labs  06/26/14 0544 06/26/14 1619 06/27/14 0516  NA 140 142 140  K 3.2* 3.6 3.8  CL 108  --  109  CO2 24  --  28  GLUCOSE 92  --  232*  BUN <5*  --  <5*  CREATININE 0.64  --  0.66  CALCIUM 8.8  --  8.1*   No results for input(s): LABPT, INR in the last 72 hours. No results for input(s): LABURIN in the last 72 hours. Results for orders placed or performed during the hospital encounter of 06/22/14  Clostridium Difficile by PCR     Status: None   Collection Time: 06/23/14  7:32 AM  Result Value Ref Range Status   C difficile by pcr NEGATIVE NEGATIVE Final    Studies/Results: No results found.  Assessment/Plan:   POD 1 sigmoid colectomy/boari flap left ureteral reimplant. Doing well urologically. Drain output expected--would check drain fluid creatinine in 2-3 days before drain removal. Will leave foley in 7-10 days at least, w/ cystogram prior to foley removal. Spoke w/ pt/ daughter this am.   LOS: 5 days   Marcine Matar M 06/27/2014, 8:06 AM

## 2014-06-27 NOTE — Progress Notes (Signed)
[facility] PROGRESS NOTE  Rory Montel Denunzio HBZ:[id number] DOB: 02-14-1941 DOA: 06/22/2014 PCP: Lupe Carney, MD  Brief Narrative 74 year old female with history of TIA, recurrent diverticulitis presenting with acute diverticulitis with CT scan of the abdomen showing diverticular abscess. IR was unable to drain the abscess and since there was a left ureteral injury with colovesical  Fistula, patient underwent sigmoid colectomy, loop ileostomy,boari flap left ureteral reimplant and bladder repair.  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 Urology and surgery closely following. Urology recommends to keep Foley in place for next 7-10 days at least with plan on cystogram prior to Foley removal. Continue JP drain for now.  Pain control. Nothing by mouth. IV hydration. - empiric Zosyn.  Hypotension Patient hypotensive this morning likely in the setting of dehydration  versus narcotics and acute postop blood loss anemia. Ordered 1 unit PRBC. Continue IV hydration and monitor.  Acute blood loss anemia Transfuse with 1 unit PRBC.  Arthritis Stable.  DVT prophylaxis: SCDs.  Diet nothing by mouth  Code Status: Full code Family Communication: Daughter at bedside Disposition Plan: currently  inpatient.   Consultants:  Surgery  Urology (Dr. Janifer Adie)  Procedures: sigmoid colectomy, loop ileostomy,boari flap left ureteral reimplant and bladder repair on 2/22.   Antibiotics:  IV Zosyn since 2/18  HPI/Subjective: Issues seen and examined. Drop blood pressure 90/40 just after receiving IV pain medications. Reports feeling dizzy. Given 1 L IV normal saline with some improvement in blood pressure. Reports some abdominal pain.  Objective: Filed Vitals:   06/27/14 1746  BP: 108/31  Pulse: 90  Temp: 98.5 F (36.9 C)  Resp: 16     Intake/Output Summary (Last 24 hours) at 06/27/14 1854 Last data filed at 06/27/14 1750  Gross per 24 hour  Intake 2686.5 ml  Output    970 ml  Net 1716.5 ml   Filed Weights   06/22/14 1703 06/22/14 2216  Weight: 52.118 kg (114 lb 14.4 oz) 52.7 kg (116 lb 2.9 oz)    Exam:   General:  Elderly female lying in bed in no acute distress  HEENT: Pallor present, moist oral mucosa, supple neck  Chest: Clear to condition bilaterally  CVS: Normal S1 and S2, no murmurs rub or gallop  GI: Ileostomy in place, JP drain, bowel sounds present, nondistended  Extremities: Warm, no edema  CNS: Alert and oriented  Data Reviewed: Basic Metabolic Panel:  Recent Labs Lab 06/22/14 1709 06/23/14 0626 06/24/14 0511 06/26/14 0544 06/26/14 1619 06/27/14 0516  NA 137 141 140 140 142 140  K 3.8 3.7 3.6 3.2* 3.6 3.8  CL 100 104 106 108  --  109  CO2 29 34* 26 24  --  28  GLUCOSE 93 107* 113* 92  --  232*  BUN 8 <5* <5* <5*  --  <5*  CREATININE 0.66 0.71 0.70 0.64  --  0.66  CALCIUM 9.2 9.0 9.3 8.8  --  8.1*   Liver Function Tests:  Recent Labs Lab 06/22/14 1709 06/23/14 0626  AST 19 14  ALT 13 11  ALKPHOS 63 52  BILITOT 0.6 0.7  PROT 7.6 6.6  ALBUMIN 3.3* 2.8*    Recent Labs Lab 06/22/14 1709  LIPASE 22   No results for input(s): AMMONIA in the last 168 hours. CBC:  Recent Labs Lab 06/22/14 1709 06/23/14 1017 06/24/14 5102 06/26/14 0544 06/26/14 1619 06/26/14 2105 06/27/14 0516 06/27/14  1159  WBC 9.6 7.4 6.9 7.8  --  17.3* 10.8*  --   NEUTROABS 6.2  --   --   --   --   --  8.2*  --   HGB 10.6* 10.0* 10.1* 9.9* 12.2 9.2* 8.9* 7.7*  HCT 32.9* 31.9* 32.0* 31.0* 36.0 28.7* 28.1* 24.9*  MCV 85.2 85.8 85.3 84.9  --  84.9 85.7  --   PLT 537* 452* 459* 449*  --  417* 424*  --    Cardiac Enzymes: No results for input(s): CKTOTAL, CKMB, CKMBINDEX, TROPONINI in the last 168 hours. BNP (last 3 results) No results for input(s): BNP in the last 8760  hours.  ProBNP (last 3 results) No results for input(s): PROBNP in the last 8760 hours.  CBG:  Recent Labs Lab 06/23/14 0730 06/24/14 0747 06/25/14 0743 06/26/14 0736 06/27/14 0744  GLUCAP 95 105* 98 106* 233*    Recent Results (from the past 240 hour(s))  Clostridium Difficile by PCR     Status: None   Collection Time: 06/23/14  7:32 AM  Result Value Ref Range Status   C difficile by pcr NEGATIVE NEGATIVE Final     Studies: No results found.  Scheduled Meds: . folic acid  1 mg Oral Daily  . methocarbamol (ROBAXIN)  IV  1,000 mg Intravenous 3 times per day  . piperacillin-tazobactam (ZOSYN)  IV  3.375 g Intravenous Q8H   Continuous Infusions: . dextrose 5 % and 0.45 % NaCl with KCl 20 mEq/L 125 mL/hr at 06/27/14 1148      Time spent: 25 minutes   Ilhan Debenedetto  [facility] Pager [phone]. If 7PM-7AM, please contact night-coverage at [email or web address], password [facility] 06/27/2014, 6:54 PM  LOS: 5 days

## 2014-06-27 NOTE — Progress Notes (Signed)
[facility] Progress Note  1 Day Post-Op  Subjective: Pt sore anytime she moves.  No N/V, feels like she needs to burp.  No flatus or BM in ileostomy pouch yet.  Not ambulated OOB.  Foley in place.  JP drain is sanguinous.  Daughter at bedside.    Objective: Vital signs in last 24 hours: Temp:  [97.3 F (36.3 C)-99.4 F (37.4 C)] 97.9 F (36.6 C) (02/23 0359) Pulse Rate:  [67-115] 107 (02/23 0359) Resp:  [16-21] 16 (02/23 0359) BP: (128-163)/(41-72) 143/57 mmHg (02/23 0359) SpO2:  [97 %-100 %] 100 % (02/23 0359) Last BM Date: 06/26/14  Intake/Output from previous day: 02/22 0701 - 02/23 0700 In: 4106.5 [I.V.:3706.5; IV Piggyback:400] Out: 1760 [Urine:1175; Drains:185; Blood:400] Intake/Output this shift:    PE: Gen:  Alert, NAD, pleasant Abd: Soft, mild distension, tender to palpation over the entire abdomen, diminished BS, no HSM, incisions C/D/I, drain with sanguinous drainage, no abdominal scars noted   Lab Results:   Recent Labs  06/26/14 2105 06/27/14 0516  WBC 17.3* 10.8*  HGB 9.2* 8.9*  HCT 28.7* 28.1*  PLT 417* 424*   BMET  Recent Labs  06/26/14 0544 06/26/14 1619 06/27/14 0516  NA 140 142 140  K 3.2* 3.6 3.8  CL 108  --  109  CO2 24  --  28  GLUCOSE 92  --  232*  BUN <5*  --  PENDING  CREATININE 0.64  --  0.66  CALCIUM 8.8  --  8.1*   PT/INR No results for input(s): LABPROT, INR in the last 72 hours. CMP     Component Value Date/Time   NA 140 06/27/2014 0516   K 3.8 06/27/2014 0516   CL 109 06/27/2014 0516   CO2 28 06/27/2014 0516   GLUCOSE 232* 06/27/2014 0516   BUN PENDING 06/27/2014 0516   CREATININE 0.66 06/27/2014 0516   CALCIUM 8.1* 06/27/2014 0516   PROT 6.6 06/23/2014 0626   ALBUMIN 2.8* 06/23/2014 0626   AST 14 06/23/2014 0626   ALT 11 06/23/2014 0626   ALKPHOS 52 06/23/2014 0626   BILITOT 0.7 06/23/2014 0626   GFRNONAA 86* 06/27/2014 0516   GFRAA >90 06/27/2014 0516   Lipase     Component Value Date/Time    LIPASE 22 06/22/2014 1709       Studies/Results: No results found.  Anti-infectives: Anti-infectives    Start     Dose/Rate Route Frequency Ordered Stop   06/22/14 2230  piperacillin-tazobactam (ZOSYN) IVPB 3.375 g     3.375 g 12.5 mL/hr over 240 Minutes Intravenous Every 8 hours 06/22/14 2221     06/22/14 2030  ciprofloxacin (CIPRO) IVPB 400 mg  Status:  Discontinued     400 mg 200 mL/hr over 60 Minutes Intravenous  Once 06/22/14 2029 06/22/14 2351   06/22/14 2030  metroNIDAZOLE (FLAGYL) IVPB 500 mg     500 mg 100 mL/hr over 60 Minutes Intravenous  Once 06/22/14 2029 06/22/14 2141       Assessment/Plan Sigmoid diverticulitis with colo-vesicle fistula, left ureteral transection POD #1 s/p BOARI FLAP LEFT URETERAL REIMPLANT, LOOP ILEOSTOMY, SIGMOID COLECTOMY, BLADDER REPAIR (Dr. Lindie Spruce) -Dr. Retta Diones following, will keep foley and stent at discharge -NPO, IVF, pain control, antiemetics -Mobilize and IS, rocking chair -Keep JP drain, will check Cr. Level of drainage before removal -Await bowel function prior to advancing diet, ileus expected -Add robaxin and ice -Await pathology    LOS: 5 days    DORT, [facility] 06/27/2014, 7:32 AM  Pager: [phone]

## 2014-06-28 LAB — CBC WITH DIFFERENTIAL/PLATELET
BASOS ABS: 0 10*3/uL (ref 0.0–0.1)
Basophils Relative: 0 % (ref 0–1)
EOS PCT: 1 % (ref 0–5)
Eosinophils Absolute: 0.1 10*3/uL (ref 0.0–0.7)
HCT: 28.8 % — ABNORMAL LOW (ref 36.0–46.0)
Hemoglobin: 9.4 g/dL — ABNORMAL LOW (ref 12.0–15.0)
LYMPHS PCT: 11 % — AB (ref 12–46)
Lymphs Abs: 1.2 10*3/uL (ref 0.7–4.0)
MCH: 27.6 pg (ref 26.0–34.0)
MCHC: 32.6 g/dL (ref 30.0–36.0)
MCV: 84.7 fL (ref 78.0–100.0)
Monocytes Absolute: 1.2 10*3/uL — ABNORMAL HIGH (ref 0.1–1.0)
Monocytes Relative: 11 % (ref 3–12)
NEUTROS PCT: 77 % (ref 43–77)
Neutro Abs: 8.3 10*3/uL — ABNORMAL HIGH (ref 1.7–7.7)
Platelets: 325 10*3/uL (ref 150–400)
RBC: 3.4 MIL/uL — ABNORMAL LOW (ref 3.87–5.11)
RDW: 16.8 % — AB (ref 11.5–15.5)
WBC: 10.8 10*3/uL — AB (ref 4.0–10.5)

## 2014-06-28 LAB — TYPE AND SCREEN
ABO/RH(D): O POS
Antibody Screen: NEGATIVE
Unit division: 0

## 2014-06-28 LAB — BASIC METABOLIC PANEL
Anion gap: 5 (ref 5–15)
BUN: <5 mg/dL — ABNORMAL LOW (ref 6–23)
CHLORIDE: 109 mmol/L (ref 96–112)
CO2: 24 mmol/L (ref 19–32)
Calcium: 7.9 mg/dL — ABNORMAL LOW (ref 8.4–10.5)
Creatinine, Ser: 0.55 mg/dL (ref 0.50–1.10)
GFR calc Af Amer: >90 mL/min (ref >90)
GFR calc non Af Amer: >90 mL/min (ref >90)
Glucose, Bld: 172 mg/dL — ABNORMAL HIGH (ref 70–99)
Potassium: 3.7 mmol/L (ref 3.5–5.1)
SODIUM: 138 mmol/L (ref 135–145)

## 2014-06-28 LAB — GLUCOSE, CAPILLARY
GLUCOSE-CAPILLARY: 165 mg/dL — AB (ref 70–99)
Glucose-Capillary: 182 mg/dL — ABNORMAL HIGH (ref 70–99)

## 2014-06-28 MED ORDER — CALCIUM CARBONATE ANTACID 500 MG PO CHEW
2.0000 | CHEWABLE_TABLET | ORAL | Status: DC | PRN
  Administered 2014-07-02: 400 mg via ORAL
  Filled 2014-06-28 (×2): qty 1

## 2014-06-28 MED ORDER — BOOST / RESOURCE BREEZE PO LIQD
1.0000 | Freq: Three times a day (TID) | ORAL | Status: DC
  Administered 2014-06-28 (×2): 1 via ORAL

## 2014-06-28 NOTE — Evaluation (Signed)
Physical Therapy Evaluation Patient Details Name: Yvonne Espinoza MRN: [id number] DOB: [birth year] Today's Date: 06/28/2014   History of Present Illness  Pt is a 74 yo female presenting with Sigmoid diverticulitis with colo-vesicle fistula, left ureteral transection. Pt underwent boari flap left ureteral reimplant, loo ileostomy, sigmoid colectomy, bladder repair (dr. Lindie Spruce.).  Clinical Impression  Pt tolerated amb and transfers well this afternoon. Pt desires to go home but at this time pt would benefit from ST-SNF to achieve safe mod I level of function as she does not have 24/7 assist at this time. Acute Pt to con't to progress mobility and re-eval d/c planning t/o pt hospital stay.     Follow Up Recommendations SNF;Supervision/Assistance - 24 hour    Equipment Recommendations  Rolling walker with 5" wheels    Recommendations for Other Services       Precautions / Restrictions Precautions Precautions: Fall Restrictions Weight Bearing Restrictions: No      Mobility  Bed Mobility Overal bed mobility: Needs Assistance Bed Mobility: Rolling;Sidelying to Sit Rolling: Min assist Sidelying to sit: Min assist       General bed mobility comments: minA to initiate trunk elevation  Transfers Overall transfer level: Needs assistance   Transfers: Sit to/from Stand Sit to Stand: Min guard         General transfer comment: v/c's for safe hand placement, increased time to achieve trunk extension  Ambulation/Gait Ambulation/Gait assistance: Min guard Ambulation Distance (Feet): 150 Feet Assistive device: Rolling walker (2 wheeled) Gait Pattern/deviations: Step-through pattern   Gait velocity interpretation: Below normal speed for age/gender General Gait Details: pt guarded, v/c's to relax shoulders.  no episodes of LOB  Stairs            Wheelchair Mobility    Modified Rankin (Stroke Patients Only)       Balance                                             Pertinent Vitals/Pain Pain Assessment: 0-10 Pain Score: 5  Pain Location: abdomen Pain Intervention(s): Premedicated before session    Home Living Family/patient expects to be discharged to:: Private residence Living Arrangements: Spouse/significant other Available Help at Discharge: Family;Friend(s);Available PRN/intermittently (spouse with recent TKA and just d/c'd to [facility]) Type of Home: House Home Access: Stairs to enter Entrance Stairs-Rails: Left Entrance Stairs-Number of Steps: 5 Home Layout: One level Home Equipment: Cane - single point      Prior Function Level of Independence: Independent               Hand Dominance   Dominant Hand: Right    Extremity/Trunk Assessment   Upper Extremity Assessment: Overall WFL for tasks assessed           Lower Extremity Assessment: Overall WFL for tasks assessed      Cervical / Trunk Assessment: Normal  Communication   Communication: No difficulties  Cognition Arousal/Alertness: Awake/alert Behavior During Therapy: WFL for tasks assessed/performed Overall Cognitive Status: Within Functional Limits for tasks assessed                      General Comments      Exercises        Assessment/Plan    PT Assessment Patient needs continued PT services  PT Diagnosis Difficulty walking;Acute pain;Generalized weakness   PT Problem List Decreased strength;Decreased  range of motion;Decreased activity tolerance;Decreased balance;Decreased mobility  PT Treatment Interventions DME instruction;Gait training;Stair training;Functional mobility training;Therapeutic activities;Therapeutic exercise   PT Goals (Current goals can be found in the Care Plan section) Acute Rehab PT Goals Patient Stated Goal: to go home if possible PT Goal Formulation: With patient Time For Goal Achievement: 07/05/14 Potential to Achieve Goals: Good    Frequency Min 3X/week   Barriers to discharge Decreased  caregiver support pt would be alone with people coming in/out    Co-evaluation               End of Session Equipment Utilized During Treatment: Gait belt Activity Tolerance: Patient tolerated treatment well Patient left: in chair;with call bell/phone within reach Nurse Communication: Mobility status         Time: 8676-7209 PT Time Calculation (min) (ACUTE ONLY): 23 min   Charges:   PT Evaluation $Initial PT Evaluation Tier I: 1 Procedure PT Treatments $Gait Training: 8-22 mins   PT G CodesMarcene Brawn 06/28/2014, 4:59 PM   Lewis Shock, PT, DPT Pager #: [phone] Office #: [phone]

## 2014-06-28 NOTE — Clinical Social Work Note (Addendum)
Patient's husband is a patient (5H84) who underwent a total knee replacement and will need STR/SNF at time of discharge (today 06/28/2014).  Patient's daughter, Leia Alf, is actively involved in the decision making process for placement for both patients.  Marla wishes for both patients to receive STR at same SNF- [facility] at time of discharge.  5N CSW has coordinated care with 6N CSW and SNF.  Per chart review, PT has been ordered (06/28/2014 am) and patient should have placement/disposition recommendations shortly.      Leia Alf can be reached at [phone]  [facility] ph: [phone] Fax: [phone]  Vickii Penna, LCSWA [phone]  Psychiatric & Orthopedics (5N 1-16) Clinical Social Worker

## 2014-06-28 NOTE — Progress Notes (Signed)
Patient ID: Yvonne Espinoza, female   DOB: 1941-04-08, 74 y.o.   MRN: [id number] 2 Days Post-Op  Subjective: Pt had a rough night, but doing better this morning.  Tried to mobilize yesterday, but had difficulty.  Objective: Vital signs in last 24 hours: Temp:  [98 F (36.7 C)-99 F (37.2 C)] 98.5 F (36.9 C) (02/24 0551) Pulse Rate:  [78-93] 89 (02/24 0551) Resp:  [16-17] 17 (02/24 0551) BP: (96-120)/(31-47) 114/44 mmHg (02/24 0551) SpO2:  [98 %-100 %] 98 % (02/24 0551) Last BM Date: 06/26/14  Intake/Output from previous day: 02/23 0701 - 02/24 0700 In: 1820 [I.V.:1370; Blood:350; IV Piggyback:100] Out: 1750 [Urine:1650; Drains:100] Intake/Output this shift:    PE: Abd: soft, appropriately tender, +BS, ND, incision c/d/i with staples and honeycomb dressing.  Stoma is pink with some sweat in the bag.  JP drain with serosang output.  Foley with good urine output.  Lab Results:   Recent Labs  06/27/14 2030 06/28/14 0500  WBC 12.8* 10.8*  HGB 9.5* 9.4*  HCT 29.1* 28.8*  PLT 336 325   BMET  Recent Labs  06/27/14 0516 06/28/14 0500  NA 140 138  K 3.8 3.7  CL 109 109  CO2 28 24  GLUCOSE 232* 172*  BUN <5* <5*  CREATININE 0.66 0.55  CALCIUM 8.1* 7.9*   PT/INR No results for input(s): LABPROT, INR in the last 72 hours. CMP     Component Value Date/Time   NA 138 06/28/2014 0500   K 3.7 06/28/2014 0500   CL 109 06/28/2014 0500   CO2 24 06/28/2014 0500   GLUCOSE 172* 06/28/2014 0500   BUN <5* 06/28/2014 0500   CREATININE 0.55 06/28/2014 0500   CALCIUM 7.9* 06/28/2014 0500   PROT 6.6 06/23/2014 0626   ALBUMIN 2.8* 06/23/2014 0626   AST 14 06/23/2014 0626   ALT 11 06/23/2014 0626   ALKPHOS 52 06/23/2014 0626   BILITOT 0.7 06/23/2014 0626   GFRNONAA >90 06/28/2014 0500   GFRAA >90 06/28/2014 0500   Lipase     Component Value Date/Time   LIPASE 22 06/22/2014 1709       Studies/Results: No results found.  Anti-infectives: Anti-infectives    Start     Dose/Rate Route Frequency Ordered Stop   06/22/14 2230  piperacillin-tazobactam (ZOSYN) IVPB 3.375 g     3.375 g 12.5 mL/hr over 240 Minutes Intravenous Every 8 hours 06/22/14 2221     06/22/14 2030  ciprofloxacin (CIPRO) IVPB 400 mg  Status:  Discontinued     400 mg 200 mL/hr over 60 Minutes Intravenous  Once 06/22/14 2029 06/22/14 2351   06/22/14 2030  metroNIDAZOLE (FLAGYL) IVPB 500 mg     500 mg 100 mL/hr over 60 Minutes Intravenous  Once 06/22/14 2029 06/22/14 2141       Assessment/Plan   Sigmoid diverticulitis with colo-vesicle fistula, left ureteral transection POD #2 s/p BOARI FLAP LEFT URETERAL REIMPLANT, LOOP ILEOSTOMY, SIGMOID COLECTOMY, BLADDER REPAIR (Dr. Lindie Spruce) - will give clear liquids today as she has good bowel sounds -PT ordered for mobilization -WOC for ostomy teaching -Zosyn D7/12 (?) -Dr. Retta Diones following, foley to remain in place for at least 10 days Acute blood loss anemia -s/p 1 unit of pRBCs yesterday 2-23 Deconditioning -PT ordered today VTE proph -SCDs, will start lovenox today Dispo -remain inpatient  Transferred to general surgery service today.   LOS: 6 days    Yvonne Espinoza 06/28/2014, 10:23 AM Pager: [phone]

## 2014-06-28 NOTE — Progress Notes (Addendum)
NUTRITION FOLLOW UP  Intervention:   Resource Breeze po TID, each supplement provides 250 kcal and 9 grams of protein  Nutrition Dx:   Inadequate oral intake related to altered GI function as evidenced by s/p colectomy, clear liquid diet; ongoing  Goal:   Pt will meet >90% of estimated nutritional needs; unmet  Monitor:   Diet advancement, PO intake, labs, weight changes, I/O's  Assessment:   Yvonne Espinoza is a 74 y.o. female presents with diverticulitis. Patient states that she has had similar issues dating back to December of 2015. Patient states that she has been having diarrhea forr about 6 months which has been going off and on and she has not been doing any better.  S/p Procedure(s) on 06/26/14: BOARI FLAP LEFT URETERAL REIMPLANT LOOP ILEOSTOMY SIGMOID COLECTOMY  BLADDER REPAIR  Per surgery notes, but with good bowel sounds, but no BM yet. She has just been advanced to a clear liquid diet this AM. RD will add clear liquid supplement for additional nutrition support.  Awaiting PT consult for deconditioning. Discharge disposition based upon eval.  Labs reviewed. BUN <5, Calcium: 7.9, Glucose: 172, CBGS: 165-233.   Height: Ht Readings from Last 1 Encounters:  06/22/14 5' (1.524 m)    Weight Status:   Wt Readings from Last 1 Encounters:  06/22/14 116 lb 2.9 oz (52.7 kg)    Re-estimated needs:  Kcal: 1500-1700 Protein: 60-70 grams Fluid: 1.5-1.7 L  Skin: JP drain to lt medial abdomen, closed abdominal incision, RLQ colostomy  Diet Order: Diet clear liquid   Intake/Output Summary (Last 24 hours) at 06/28/14 1335 Last data filed at 06/28/14 1200  Gross per 24 hour  Intake   2555 ml  Output   2325 ml  Net    230 ml    Last BM: 06/26/14   Labs:   Recent Labs Lab 06/26/14 0544 06/26/14 1619 06/27/14 0516 06/28/14 0500  NA 140 142 140 138  K 3.2* 3.6 3.8 3.7  CL 108  --  109 109  CO2 24  --  28 24  BUN <5*  --  <5* <5*  CREATININE 0.64  --  0.66  0.55  CALCIUM 8.8  --  8.1* 7.9*  GLUCOSE 92  --  232* 172*    CBG (last 3)   Recent Labs  06/27/14 0744 06/28/14 0730 06/28/14 1132  GLUCAP 233* 165* 182*    Scheduled Meds: . antiseptic oral rinse  7 mL Mouth Rinse q12n4p  . chlorhexidine  15 mL Mouth Rinse BID  . folic acid  1 mg Oral Daily  . methocarbamol (ROBAXIN)  IV  1,000 mg Intravenous 3 times per day  . piperacillin-tazobactam (ZOSYN)  IV  3.375 g Intravenous Q8H    Continuous Infusions: . dextrose 5 % and 0.45 % NaCl with KCl 20 mEq/L 125 mL/hr at 06/28/14 1302    Miron Marxen A. Mayford Knife, RD, LDN, CDE Pager: [phone] After hours Pager: [phone]

## 2014-06-28 NOTE — Progress Notes (Signed)
2 Days Post-Op Subjective: Patient reports a fair amount of lower abdominal pain.  No flatus yet.  Urine lungs clear, yellowish without clots  Objective: Vital signs in last 24 hours: Temp:  [97.6 F (36.4 C)-99 F (37.2 C)] 98.5 F (36.9 C) (02/24 0551) Pulse Rate:  [78-93] 89 (02/24 0551) Resp:  [16-17] 17 (02/24 0551) BP: (96-120)/(24-47) 114/44 mmHg (02/24 0551) SpO2:  [98 %-100 %] 98 % (02/24 0551)  Intake/Output from previous day: 02/23 0701 - 02/24 0700 In: 1820 [I.V.:1370; Blood:350; IV Piggyback:100] Out: 1750 [Urine:1650; Drains:100] Intake/Output this shift:    Physical Exam:  Constitutional: Vital signs reviewed. WD WN in NAD   Eyes: PERRL, No scleral icterus.   Pulmonary/Chest: Normal effort Abdominal: Soft. Appropriately tender    Lab Results:  Recent Labs  06/27/14 1159 06/27/14 2030 06/28/14 0500  HGB 7.7* 9.5* 9.4*  HCT 24.9* 29.1* 28.8*   BMET  Recent Labs  06/27/14 0516 06/28/14 0500  NA 140 138  K 3.8 3.7  CL 109 109  CO2 28 24  GLUCOSE 232* 172*  BUN <5* <5*  CREATININE 0.66 0.55  CALCIUM 8.1* 7.9*   No results for input(s): LABPT, INR in the last 72 hours. No results for input(s): LABURIN in the last 72 hours. Results for orders placed or performed during the hospital encounter of 06/22/14  Clostridium Difficile by PCR     Status: None   Collection Time: 06/23/14  7:32 AM  Result Value Ref Range Status   C difficile by pcr NEGATIVE NEGATIVE Final    Studies/Results: No results found.  Assessment/Plan:   Postoperative day #2 left ureteral reimplant with Boari flap.  She has minimal JP drainage.  Leave drain in until before discharge-prior to that, send drain fluid for creatinine to check that this is consistent with serum rather than urine (a high creatinine would be consistent with urine leaking)  Catheter should be left in for at least 10 days.  I will be out of town the next few days.  I will have one of my  associates follow her.   LOS: 6 days   Marcine Matar M 06/28/2014, 8:57 AM

## 2014-06-28 NOTE — Consult Note (Signed)
WOC ostomy follow up Stoma type/location: RUQ ileostomy  Stomal assessment/size: Stoma flat but otherwise WNL, red in color; 1.5 in. Oval in shape. Sutures intact from surgery.   Peristomal assessment: Incision noted to the midline abdomen covered with honeycomb gauze with JP drain in place; old bloody drainage noted to gauze but otherwise dressing appeared WNL. Output: Minimal brown liquid stool noted in bag. Ostomy pouching: 1pc.  Education provided: Ostomy placed 2/22 by Dr. Lindie Spruce. Pt denies discomfort at this time-has just received pain medicine.. Pt very knowledgeable about the events of her surgery and seems eager to learn how to maintain her ostomy. Ostomy was removed and area around the stoma was cleaned.  Pt visualized her stoma site with a mirror while pouch was changed.  Given that the patient's stoma is more flat than ideal, a barrier ring was applied.  Patient was instructed on what the barrier ring was for and how to apply the barrier ring to her site.  She visualized the site after the barrier ring was placed with the mirror.  A new pouch cut to fit her site and was applied.  Pt watched as this was performed and asked questions appropriately.  I reviewed with the patient how to open and close the pouch and how often to empty the pouch, to which she successfully provided a return demonstration.  The importance of hydration was emphasized as well with the patient.  A booklet was left at the bedside to read at her leisure regarding the information we covered in today's session, as well as dietary guidelines, hydration, and trouble shooting tips. Will follow-up with patient to continue to provide ostomy education. Enrolled patient in [place] Secure Start Discharge program: Yes  Carita Pian, BSN, RN-BC Graduate Student  Cammie Mcgee MSN, RN, [place], [place], [place] [phone]

## 2014-06-29 ENCOUNTER — Inpatient Hospital Stay (HOSPITAL_COMMUNITY): Payer: Medicare Other

## 2014-06-29 LAB — BASIC METABOLIC PANEL
Anion gap: 11 (ref 5–15)
CO2: 23 mmol/L (ref 19–32)
Calcium: 8.2 mg/dL — ABNORMAL LOW (ref 8.4–10.5)
Chloride: 100 mmol/L (ref 96–112)
Creatinine, Ser: 0.52 mg/dL (ref 0.50–1.10)
GFR calc Af Amer: >90 mL/min (ref >90)
GFR calc non Af Amer: >90 mL/min (ref >90)
GLUCOSE: 202 mg/dL — AB (ref 70–99)
Potassium: 4 mmol/L (ref 3.5–5.1)
Sodium: 134 mmol/L — ABNORMAL LOW (ref 135–145)

## 2014-06-29 LAB — CBC
HCT: 33.8 % — ABNORMAL LOW (ref 36.0–46.0)
Hemoglobin: 11.1 g/dL — ABNORMAL LOW (ref 12.0–15.0)
MCH: 27.5 pg (ref 26.0–34.0)
MCHC: 32.8 g/dL (ref 30.0–36.0)
MCV: 83.7 fL (ref 78.0–100.0)
Platelets: 394 10*3/uL (ref 150–400)
RBC: 4.04 MIL/uL (ref 3.87–5.11)
RDW: 16.3 % — ABNORMAL HIGH (ref 11.5–15.5)
WBC: 14.6 10*3/uL — ABNORMAL HIGH (ref 4.0–10.5)

## 2014-06-29 LAB — GLUCOSE, CAPILLARY
GLUCOSE-CAPILLARY: 200 mg/dL — AB (ref 70–99)
GLUCOSE-CAPILLARY: 186 mg/dL — AB (ref 70–99)

## 2014-06-29 LAB — CREATININE, FLUID (PLEURAL, PERITONEAL, JP DRAINAGE): Creat, Fluid: 0.5 mg/dL

## 2014-06-29 MED ORDER — PANTOPRAZOLE SODIUM 40 MG IV SOLR
40.0000 mg | INTRAVENOUS | Status: DC
  Administered 2014-06-29 – 2014-07-02 (×4): 40 mg via INTRAVENOUS
  Filled 2014-06-29 (×4): qty 40

## 2014-06-29 MED ORDER — PROMETHAZINE HCL 25 MG/ML IJ SOLN
6.2500 mg | Freq: Four times a day (QID) | INTRAMUSCULAR | Status: DC | PRN
  Administered 2014-06-29 – 2014-07-03 (×2): 12.5 mg via INTRAVENOUS
  Filled 2014-06-29 (×2): qty 1

## 2014-06-29 MED ORDER — ENOXAPARIN SODIUM 40 MG/0.4ML ~~LOC~~ SOLN
40.0000 mg | SUBCUTANEOUS | Status: DC
  Administered 2014-06-29 – 2014-07-05 (×7): 40 mg via SUBCUTANEOUS
  Filled 2014-06-29 (×7): qty 0.4

## 2014-06-29 NOTE — Progress Notes (Signed)
Patient ID: Yvonne Espinoza, female   DOB: [birth year], 74 y.o.   MRN: [id number] 3 Days Post-Op  Subjective: Pt does not feel well today.  She threw up earlier about 100cc of bilious output.  Having reflux and indigestion.  Objective: Vital signs in last 24 hours: Temp:  [98.4 F (36.9 C)-99.1 F (37.3 C)] 98.4 F (36.9 C) (02/25 0603) Pulse Rate:  [96-104] 103 (02/25 0603) Resp:  [16-17] 16 (02/25 0603) BP: (140-153)/(61-72) 153/70 mmHg (02/25 0603) SpO2:  [94 %-98 %] 94 % (02/25 0603) Last BM Date: 06/28/14  Intake/Output from previous day: 02/24 0701 - 02/25 0700 In: 4380 [P.O.:720; I.V.:3550; IV Piggyback:110] Out: 2880 [Urine:2500; Drains:380] Intake/Output this shift: Total I/O In: -  Out: 150 [Emesis/NG output:100; Drains:50]  PE: Abd: soft, some increase in distention, but not significant, still with some BS, but no ostomy output still.  Stoma is pink and viable.  JP drain with serosang output.  Incision c/d/i with staples and honeycomb dressing. GU: foley in place with clear urine  Lab Results:   Recent Labs  06/28/14 0500 06/29/14 0438  WBC 10.8* 14.6*  HGB 9.4* 11.1*  HCT 28.8* 33.8*  PLT 325 394   BMET  Recent Labs  06/28/14 0500 06/29/14 0438  NA 138 134*  K 3.7 4.0  CL 109 100  CO2 24 23  GLUCOSE 172* 202*  BUN <5* <5*  CREATININE 0.55 0.52  CALCIUM 7.9* 8.2*   PT/INR No results for input(s): LABPROT, INR in the last 72 hours. CMP     Component Value Date/Time   NA 134* 06/29/2014 0438   K 4.0 06/29/2014 0438   CL 100 06/29/2014 0438   CO2 23 06/29/2014 0438   GLUCOSE 202* 06/29/2014 0438   BUN <5* 06/29/2014 0438   CREATININE 0.52 06/29/2014 0438   CALCIUM 8.2* 06/29/2014 0438   PROT 6.6 06/23/2014 0626   ALBUMIN 2.8* 06/23/2014 0626   AST 14 06/23/2014 0626   ALT 11 06/23/2014 0626   ALKPHOS 52 06/23/2014 0626   BILITOT 0.7 06/23/2014 0626   GFRNONAA >90 06/29/2014 0438   GFRAA >90 06/29/2014 0438   Lipase     Component  Value Date/Time   LIPASE 22 06/22/2014 1709       Studies/Results: No results found.  Anti-infectives: Anti-infectives    Start     Dose/Rate Route Frequency Ordered Stop   06/22/14 2230  piperacillin-tazobactam (ZOSYN) IVPB 3.375 g     3.375 g 12.5 mL/hr over 240 Minutes Intravenous Every 8 hours 06/22/14 2221     06/22/14 2030  ciprofloxacin (CIPRO) IVPB 400 mg  Status:  Discontinued     400 mg 200 mL/hr over 60 Minutes Intravenous  Once 06/22/14 2029 06/22/14 2351   06/22/14 2030  metroNIDAZOLE (FLAGYL) IVPB 500 mg     500 mg 100 mL/hr over 60 Minutes Intravenous  Once 06/22/14 2029 06/22/14 2141       Assessment/Plan   Sigmoid diverticulitis with colo-vesicle fistula, left ureteral transection POD #3 s/p BOARI FLAP LEFT URETERAL REIMPLANT, LOOP ILEOSTOMY, SIGMOID COLECTOMY, BLADDER REPAIR (Dr. Lindie Spruce) -post op ileus, will make NPO and check a plain film to see if she will need and NGT.  Would like to avoid this if able -PT ordered for mobilization -WOC for ostomy teaching -Zosyn D8/12 (?) -Dr. Retta Diones following, foley to remain in place for at least 10 days Acute blood loss anemia -s/p 1 unit of pRBCs yesterday 2-23 -hgb up to 11.1 today from 9.5.  On 125cc of fluid so doubt this is from dehydration.  Will follow PCM/NPO -consider TNA soon if unable to eat as she has not eaten in over a week Leukocytosis  - up to 14K today.  Will follow. Deconditioning -PT ordered today VTE proph -SCDs, will start lovenox today Dispo -remain inpatient  LOS: 7 days    [name] 06/29/2014, 10:00 AM Pager: [phone]

## 2014-06-29 NOTE — Progress Notes (Signed)
3 Days Post-Op Subjective: Patient reports nausea, emesis.   Objective: Vital signs in last 24 hours: Temp:  [98.4 F (36.9 C)-99.1 F (37.3 C)] 98.4 F (36.9 C) (02/25 0603) Pulse Rate:  [96-104] 103 (02/25 0603) Resp:  [16-17] 16 (02/25 0603) BP: (140-153)/(61-72) 153/70 mmHg (02/25 0603) SpO2:  [94 %-98 %] 94 % (02/25 0603)  Intake/Output from previous day: 02/24 0701 - 02/25 0700 In: 4380 [P.O.:720; I.V.:3550; IV Piggyback:110] Out: 2880 [Urine:2500; Drains:380] Intake/Output this shift:    Physical Exam:  NAD abd - soft, NT GU - foley in place. Urine clear. About 1900 clear urine in bag  Lab Results:  Recent Labs  06/27/14 2030 06/28/14 0500 06/29/14 0438  HGB 9.5* 9.4* 11.1*  HCT 29.1* 28.8* 33.8*   BMET  Recent Labs  06/28/14 0500 06/29/14 0438  NA 138 134*  K 3.7 4.0  CL 109 100  CO2 24 23  GLUCOSE 172* 202*  BUN <5* <5*  CREATININE 0.55 0.52  CALCIUM 7.9* 8.2*   No results for input(s): LABPT, INR in the last 72 hours. No results for input(s): LABURIN in the last 72 hours. Results for orders placed or performed during the hospital encounter of 06/22/14  Clostridium Difficile by PCR     Status: None   Collection Time: 06/23/14  7:32 AM  Result Value Ref Range Status   C difficile by pcr NEGATIVE NEGATIVE Final    Studies/Results: KUB - left stent in good position  A/P: POD #3 s/p BOARI FLAP LEFT URETERAL REIMPLANT, LOOP ILEOSTOMY, SIGMOID COLECTOMY, BLADDER REPAIR (Dr. Lindie Spruce) -left stent in good position -continue foley catheter -I sent JP drain fluid for Creatinine -will follow     LOS: 7 days   Devlynn Knoff 06/29/2014

## 2014-06-29 NOTE — Addendum Note (Signed)
Addendum  created 06/29/14 2253 by Desean Heemstra, MD   Modules edited: Anesthesia Attestations    

## 2014-06-29 NOTE — Clinical Social Work Note (Signed)
CSW received social work consult for SNF for patient per recommendation by PT and OT.  FL2 on patient's chart to be signed by physician, and patient has been faxed out.  Patient was not feeling well today, will try to complete social work assessment tomorrow.  Ervin Knack. Rosmary Dionisio, MSW, [facility] [phone] 06/29/2014 5:16 PM

## 2014-06-29 NOTE — Progress Notes (Signed)
PT Cancellation Note  Patient Details Name: Yvonne Espinoza MRN: [id number] DOB: Oct 17, 1940   Cancelled Treatment:    Reason Eval/Treat Not Completed: Medical issues which prohibited therapy. Patient nauseated and throwing up this AM. Will attempt later if time allows.    Fredrich Birks 06/29/2014, 9:50 AM

## 2014-06-30 LAB — BASIC METABOLIC PANEL
ANION GAP: 5 (ref 5–15)
BUN: <5 mg/dL — ABNORMAL LOW (ref 6–23)
CHLORIDE: 105 mmol/L (ref 96–112)
CO2: 25 mmol/L (ref 19–32)
Calcium: 7.9 mg/dL — ABNORMAL LOW (ref 8.4–10.5)
Creatinine, Ser: 0.55 mg/dL (ref 0.50–1.10)
GFR calc Af Amer: >90 mL/min (ref >90)
GFR calc non Af Amer: >90 mL/min (ref >90)
GLUCOSE: 131 mg/dL — AB (ref 70–99)
Potassium: 3.8 mmol/L (ref 3.5–5.1)
SODIUM: 135 mmol/L (ref 135–145)

## 2014-06-30 LAB — CBC
HCT: 31.7 % — ABNORMAL LOW (ref 36.0–46.0)
Hemoglobin: 10.2 g/dL — ABNORMAL LOW (ref 12.0–15.0)
MCH: 27.2 pg (ref 26.0–34.0)
MCHC: 32.2 g/dL (ref 30.0–36.0)
MCV: 84.5 fL (ref 78.0–100.0)
Platelets: 392 10*3/uL (ref 150–400)
RBC: 3.75 MIL/uL — AB (ref 3.87–5.11)
RDW: 16.4 % — ABNORMAL HIGH (ref 11.5–15.5)
WBC: 11.7 10*3/uL — AB (ref 4.0–10.5)

## 2014-06-30 NOTE — Clinical Social Work Placement (Signed)
Clinical Social Work Department CLINICAL SOCIAL WORK PLACEMENT NOTE 06/30/2014  Patient:  Yvonne Espinoza, Yvonne Espinoza  Account Number:  [id number] Admit date:  06/22/2014  Clinical Social Worker:  Keiera Strathman, LCSWA  Date/time:  06/30/2014 04:57 PM  Clinical Social Work is seeking post-discharge placement for this patient at the following level of care:   SKILLED NURSING   (*CSW will update this form in Epic as items are completed)   06/30/2014  Patient/family provided with [facility] Department of Clinical Social Work's list of facilities offering this level of care within the geographic area requested by the patient (or if unable, by the patient's family).  06/30/2014  Patient/family informed of their freedom to choose among providers that offer the needed level of care, that participate in Medicare, Medicaid or managed care program needed by the patient, have an available bed and are willing to accept the patient.  06/30/2014  Patient/family informed of [facility]' ownership interest in [facility], as well as of the fact that they are under no obligation to receive care at this facility.  PASARR submitted to EDS on 06/29/2014 PASARR number received on 06/29/2014  FL2 transmitted to all facilities in geographic area requested by pt/family on  06/30/2014 FL2 transmitted to all facilities within larger geographic area on   Patient informed that his/her managed care company has contracts with or will negotiate with  certain facilities, including the following:     Patient/family informed of bed offers received:  06/30/2014 Patient chooses bed at [facility] Physician recommends and patient chooses bed at    Patient to be transferred to  on   Patient to be transferred to facility by  Patient and family notified of transfer on  Name of family member notified:    The following physician request were entered in Epic:  Physician please sign FL2 on chart.  Additional  Comments:  Ervin Knack. Curtistine Pettitt, MSW, [facility] [phone] 06/30/2014 4:59 PM

## 2014-06-30 NOTE — Progress Notes (Signed)
   Patient feeling much better today. She is eating full liquids. Watching TV this evening.  Filed Vitals:   06/30/14 1332  BP: 111/42  Pulse: 72  Temp: 98.2 F (36.8 C)  Resp: 19      Intake/Output Summary (Last 24 hours) at 06/30/14 2000 Last data filed at 06/30/14 1800  Gross per 24 hour  Intake 4046.83 ml  Output   2850 ml  Net 1196.83 ml    PE: No acute distress, eating dinner watching TV Urine clear  JP Drain fluid creatinine = 0.5 ml  Assessment/plan: Sigmoid diverticulitis with colo-vesicle fistula, left ureteral transection POD #4 s/p BOARI FLAP LEFT URETERAL REIMPLANT, LOOP ILEOSTOMY, SIGMOID COLECTOMY --  -Okay to DC JP drain from GU point of view -Continue Foley catheter, follow-up with Dr. Retta Diones in office for cystogram foley removal and eventually stent removal. -will follow

## 2014-06-30 NOTE — Progress Notes (Signed)
Physical Therapy Treatment Patient Details Name: Yvonne Espinoza MRN: [id number] DOB: 1941/01/12 Today's Date: 06/30/2014    History of Present Illness Pt is a 74 yo female presenting with Sigmoid diverticulitis with colo-vesicle fistula, left ureteral transection. Pt underwent boari flap left ureteral reimplant, loo ileostomy, sigmoid colectomy, bladder repair (dr. Lindie Spruce.).    PT Comments    Patient progressed since last session.  Still weak and with multiple medical complications that likely would be served easiest at [facility], [facility], [facility]; however patient eager to go home and able to demonstrate mobility unaided this session.  States neighbors can assist her as needed and I stated she would need supervision 24 hours for safety at least initially and my concerns for other medical issues that could arise.  She stated she would go to SNF if MD/therapy staff felt best for her, but felt she could make it at home.  Encourages frequent walks this weekend with nursing assist and will continue to assess as she progresses, but may be able to d/c home with HHPT, HHaide and HHRN for follow up with ostomy and foley, etc.  Follow Up Recommendations  Home health PT;Supervision/Assistance - 24 hour     Equipment Recommendations  Rolling walker with 5" wheels    Recommendations for Other Services       Precautions / Restrictions Precautions Precautions: Fall    Mobility  Bed Mobility   Bed Mobility: Sit to Sidelying         Sit to sidelying: Supervision General bed mobility comments: increased time, but patient able to perform without physical help  Transfers   Equipment used: Rolling walker (2 wheeled)   Sit to Stand: Modified independent (Device/Increase time)         General transfer comment: able to stand unassisted with increased time due to in chair several hours  Ambulation/Gait Ambulation/Gait assistance: Supervision Ambulation Distance (Feet): 300 Feet Assistive device: Rolling walker  (2 wheeled) Gait Pattern/deviations: Step-through pattern;Trunk flexed     General Gait Details: min cues for trunk extension and shoulder relaxation, steady, no LOB   Stairs            Wheelchair Mobility    Modified Rankin (Stroke Patients Only)       Balance Overall balance assessment: No apparent balance deficits (not formally assessed)                                  Cognition Arousal/Alertness: Awake/alert Behavior During Therapy: WFL for tasks assessed/performed Overall Cognitive Status: Within Functional Limits for tasks assessed                      Exercises      General Comments General comments (skin integrity, edema, etc.): Patient now with foley, but reports plans to leave in for at least a week due to surgical complications.  Was able to participate with nursing education for ostomy care.  Discussed options of home versus SNF      Pertinent Vitals/Pain Pain Score: 3  Pain Location: abdomen Pain Intervention(s): Premedicated before session    Home Living                      Prior Function            PT Goals (current goals can now be found in the care plan section) Progress towards PT goals: Progressing toward goals  Frequency  Min 3X/week    PT Plan Discharge plan needs to be updated    Co-evaluation             End of Session   Activity Tolerance: Patient tolerated treatment well Patient left: in bed;with call bell/phone within reach     Time: 1152-1227 PT Time Calculation (min) (ACUTE ONLY): 35 min  Charges:  $Gait Training: 8-22 mins $Therapeutic Activity: 8-22 mins                    G Codes:      [name],CYNDI 10-Jul-2014, 1:15 PM  [name], PT [phone] 07-10-14

## 2014-06-30 NOTE — Clinical Social Work Psychosocial (Signed)
Clinical Social Work Department BRIEF PSYCHOSOCIAL ASSESSMENT 06/30/2014  Patient:  Yvonne Espinoza, Yvonne Espinoza     Account Number:  [id number]     Admit date:  06/22/2014  Clinical Social Worker:  Elouise Munroe  Date/Time:  06/30/2014 04:37 PM  Referred by:  Physician  Date Referred:  06/29/2014 Referred for  SNF Placement   Other Referral:   Interview type:  Patient Other interview type:    PSYCHOSOCIAL DATA Living Status:  HUSBAND Admitted from facility:   Level of care:   Primary support name:  [name] Primary support relationship to patient:  SPOUSE Degree of support available:   Patient's husband is currently in [facility] for short term rehab, but normally husband is support for patient.    CURRENT CONCERNS Current Concerns  Post-Acute Placement   Other Concerns:    SOCIAL WORK ASSESSMENT / PLAN Patient is a 74 year old female who lives with her husband. Patient is alert and oriented x4, she has a daughter who lives in [place], and a son who lives near her but, has several medical issues.  Patient's husband is currently at [facility] for for rehab for knee replacement.  Patient states she is willing to go to [facility] for rehab if she has to, but would prefer to go home.  Patient was talkative and pleasant, along with feeling better than she has the past couple of days.  Patient states she has neighbors who are willing to help her out if she needs someone.  Patient expressed that her daughter had to go back to [place] and may come back if she needs to.  Patient states she is able to take care of her ostomy bag, and would like home health to come by a couple days a week if she can go home.  Patient is agreeable to going to SNF if that is what the physician and PT recommend.  Patient expressed that if her husband discharges before she is ready, she would like to just go home, but if her husband is still at SNF once she is ready to discharge then she will go to SNF.   Current plan is to go to either SNF or home health, CSW to continue to follow discharge plan.   Assessment/plan status:  Psychosocial Support/Ongoing Assessment of Needs Other assessment/ plan:   Information/referral to community resources:    PATIENT'S/FAMILY'S RESPONSE TO PLAN OF CARE: Patient agreeable to go to either SNF or home health whatever is recommended by physician and PT.    Ervin Knack. Jessikah Dicker, MSW, [facility] [phone] 06/30/2014 4:52 PM

## 2014-06-30 NOTE — Clinical Social Work Note (Signed)
Attempted to see patient to complete assessment, patient was in the middle of treatment will try later.  Yvonne Espinoza. Charmane Protzman, MSW, LCSWA [phone] 06/30/2014 11:54 AM

## 2014-06-30 NOTE — Consult Note (Signed)
WOC ostomy follow up Stoma type/location: RUQ ileostomy  Stomal assessment/size: Red and moist, oval, 1 1/2 inches, flush with skin level.  Peristomal assessment: Incision noted to the midline abdomen covered with honeycomb gauze with JP drain in place; old bloody drainage noted to gauze but otherwise dressing appeared WNL. Output: Minimal brown liquid stool noted in bag. Ostomy pouching: 1pc.  Education provided: Pouch change performed by patient with minimal amt assistance using a barrier ring and one piece pouch.  Patient  Is able to open and close the pouch to empty Reviewed pouching routines, ordering supplies, and dietary precautions.  Pt plans to have assistance at a nursing home after discharge. Enrolled patient in [facility] Discharge program and extra supplies ordered to room.  Cammie Mcgee MSN, RN, CWOCN, [place], CNS [phone]

## 2014-06-30 NOTE — Progress Notes (Signed)
[facility] Progress Note  4 Days Post-Op  Subjective: Pt says she feels much better today.  No N/V, abdominal pain manageable.  Thirsty today.  Having flatus and BM in ostomy bag.    Objective: Vital signs in last 24 hours: Temp:  [98.3 F (36.8 C)-99 F (37.2 C)] 98.6 F (37 C) (02/26 0522) Pulse Rate:  [84-99] 84 (02/26 0522) Resp:  [10-17] 17 (02/26 0522) BP: (121-145)/(52-70) 145/59 mmHg (02/26 0522) SpO2:  [97 %-98 %] 98 % (02/26 0522) Last BM Date: 06/29/14  Intake/Output from previous day: 02/25 0701 - 02/26 0700 In: 2951.8 [I.V.:2892.8; IV Piggyback:59] Out: 4430 [Urine:3650; Emesis/NG output:150; Drains:330; Stool:300] Intake/Output this shift:    PE: Gen:  Alert, NAD, pleasant Abd: Soft, appropriate tenderness, ND, +BS, no HSM, incisions C/D/I, drain with sanguinous drainage, ostomy patent with liquid stool output   Lab Results:   Recent Labs  06/29/14 0438 06/30/14 0443  WBC 14.6* 11.7*  HGB 11.1* 10.2*  HCT 33.8* 31.7*  PLT 394 392   BMET  Recent Labs  06/29/14 0438 06/30/14 0443  NA 134* 135  K 4.0 3.8  CL 100 105  CO2 23 25  GLUCOSE 202* 131*  BUN <5* <5*  CREATININE 0.52 0.55  CALCIUM 8.2* 7.9*   PT/INR No results for input(s): LABPROT, INR in the last 72 hours. CMP     Component Value Date/Time   NA 135 06/30/2014 0443   K 3.8 06/30/2014 0443   CL 105 06/30/2014 0443   CO2 25 06/30/2014 0443   GLUCOSE 131* 06/30/2014 0443   BUN <5* 06/30/2014 0443   CREATININE 0.55 06/30/2014 0443   CALCIUM 7.9* 06/30/2014 0443   PROT 6.6 06/23/2014 0626   ALBUMIN 2.8* 06/23/2014 0626   AST 14 06/23/2014 0626   ALT 11 06/23/2014 0626   ALKPHOS 52 06/23/2014 0626   BILITOT 0.7 06/23/2014 0626   GFRNONAA >90 06/30/2014 0443   GFRAA >90 06/30/2014 0443   Lipase     Component Value Date/Time   LIPASE 22 06/22/2014 1709       Studies/Results: Dg Abd Portable 1v  06/29/2014   CLINICAL DATA:  Nausea and vomiting started  this morning. Recent abdominal surgery for sigmoid diverticulitis.  EXAM: PORTABLE ABDOMEN - 1 VIEW  COMPARISON:  CT 06/21/2014  FINDINGS: There is a double-J left ureteral stent which appears to be appropriately positioned. In addition, there is a surgical drain in the pelvis. Surgical skin staples in the lower abdomen and pelvis. There is gas within the small bowel and large bowel. This is a nonobstructive bowel gas pattern. Mild degenerative changes in both hips. Limited evaluation for free air on this supine image.  IMPRESSION: Nonobstructive bowel gas pattern.  Postsurgical changes in the abdomen.   Electronically Signed   By: Richarda Overlie M.D.   On: 06/29/2014 13:10    Anti-infectives: Anti-infectives    Start     Dose/Rate Route Frequency Ordered Stop   06/22/14 2230  piperacillin-tazobactam (ZOSYN) IVPB 3.375 g     3.375 g 12.5 mL/hr over 240 Minutes Intravenous Every 8 hours 06/22/14 2221     06/22/14 2030  ciprofloxacin (CIPRO) IVPB 400 mg  Status:  Discontinued     400 mg 200 mL/hr over 60 Minutes Intravenous  Once 06/22/14 2029 06/22/14 2351   06/22/14 2030  metroNIDAZOLE (FLAGYL) IVPB 500 mg     500 mg 100 mL/hr over 60 Minutes Intravenous  Once 06/22/14 2029 06/22/14 2141  Assessment/Plan Sigmoid diverticulitis with colo-vesicle fistula, left ureteral transection POD #4 s/p BOARI FLAP LEFT URETERAL REIMPLANT, LOOP ILEOSTOMY, SIGMOID COLECTOMY, BLADDER REPAIR (Dr. Lindie Spruce) -Post op ileus improved, start sips and if improved give clears at lunch -PT ordered for mobilization -WOC for ostomy teaching -Zosyn D9/12 (?) -Dr. Retta Diones following, foley to remain in place for at least 10 days Acute blood loss anemia -s/p 1 unit of pRBCs 06/27/14 -hgb 10.2 today. Seems to be equalizing PCM/NPO -consider TNA soon if unable to eat as she has not eaten in over a week, if tolerating today can hopefully avoid this Leukocytosis  - 11.7 today. Will follow. Deconditioning -PT  VTE  proph -SCDs, will start lovenox today Dispo -remain inpatient, SNF at discharge - her husband is at [facility] and that's where she would like to go at discharge    LOS: 8 days    [place], [facility] 06/30/2014, 8:00 AM Pager: [phone]

## 2014-07-01 LAB — BASIC METABOLIC PANEL
ANION GAP: 6 (ref 5–15)
BUN: <5 mg/dL — ABNORMAL LOW (ref 6–23)
CALCIUM: 8.3 mg/dL — AB (ref 8.4–10.5)
CHLORIDE: 103 mmol/L (ref 96–112)
CO2: 27 mmol/L (ref 19–32)
Creatinine, Ser: 0.58 mg/dL (ref 0.50–1.10)
GFR calc non Af Amer: 90 mL/min — ABNORMAL LOW (ref >90)
GLUCOSE: 136 mg/dL — AB (ref 70–99)
Potassium: 4.2 mmol/L (ref 3.5–5.1)
SODIUM: 136 mmol/L (ref 135–145)

## 2014-07-01 LAB — CREATININE, FLUID (PLEURAL, PERITONEAL, JP DRAINAGE): CREAT FL: 0.6 mg/dL

## 2014-07-01 LAB — CBC
HEMATOCRIT: 32.9 % — AB (ref 36.0–46.0)
Hemoglobin: 10.5 g/dL — ABNORMAL LOW (ref 12.0–15.0)
MCH: 27.5 pg (ref 26.0–34.0)
MCHC: 31.9 g/dL (ref 30.0–36.0)
MCV: 86.1 fL (ref 78.0–100.0)
Platelets: 402 10*3/uL — ABNORMAL HIGH (ref 150–400)
RBC: 3.82 MIL/uL — ABNORMAL LOW (ref 3.87–5.11)
RDW: 16.5 % — AB (ref 11.5–15.5)
WBC: 10.8 10*3/uL — ABNORMAL HIGH (ref 4.0–10.5)

## 2014-07-01 MED ORDER — HYDROCODONE-ACETAMINOPHEN 5-325 MG PO TABS
1.0000 | ORAL_TABLET | ORAL | Status: DC | PRN
  Administered 2014-07-01 – 2014-07-04 (×3): 2 via ORAL
  Filled 2014-07-01 (×3): qty 2

## 2014-07-01 NOTE — Progress Notes (Signed)
  Pt doing well. Sitting in chair.   Urine clear.  JP Cr = 0.6. I sent JP Cr again as JP output had picked up.   s/p Procedure(s): BOARI FLAP LEFT URETERAL REIMPLANT (Left) LOOP ILEOSTOMY (N/A) SIGMOID COLECTOMY WITH BLADDER REPAIR   -continue foley - f/u with Dr. Retta Diones in 1-2 weeks for a cystogram, foley removal. Eventual left ureteral stent removal.  -OK to d/c JP from GU point of view - no evidence of urine leak

## 2014-07-01 NOTE — Progress Notes (Signed)
5 Days Post-Op  Subjective: PT with no issues overnight.  Tol CLD Ambulating well  Objective: Vital signs in last 24 hours: Temp:  [98.2 F (36.8 C)-98.8 F (37.1 C)] 98.8 F (37.1 C) (02/27 0532) Pulse Rate:  [72-94] 94 (02/27 0532) Resp:  [16-19] 16 (02/27 0532) BP: (111-124)/(38-44) 124/44 mmHg (02/27 0532) SpO2:  [97 %-99 %] 97 % (02/27 0532) Last BM Date: 06/30/14  Intake/Output from previous day: 02/26 0701 - 02/27 0700 In: 4272.7 [P.O.:780; I.V.:3034.7; IV Piggyback:218] Out: 3325 [Urine:2925; Drains:350; Stool:50] Intake/Output this shift:    General appearance: alert and cooperative GI: soft, ostomy pink/patent, active BS, wound c/d/i  Lab Results:   Recent Labs  06/30/14 0443 07/01/14 0500  WBC 11.7* 10.8*  HGB 10.2* 10.5*  HCT 31.7* 32.9*  PLT 392 402*   BMET  Recent Labs  06/29/14 0438 06/30/14 0443  NA 134* 135  K 4.0 3.8  CL 100 105  CO2 23 25  GLUCOSE 202* 131*  BUN <5* <5*  CREATININE 0.52 0.55  CALCIUM 8.2* 7.9*   PT/INR No results for input(s): LABPROT, INR in the last 72 hours. ABG No results for input(s): PHART, HCO3 in the last 72 hours.  Invalid input(s): PCO2, PO2  Studies/Results: Dg Abd Portable 1v  06/29/2014   CLINICAL DATA:  Nausea and vomiting started this morning. Recent abdominal surgery for sigmoid diverticulitis.  EXAM: PORTABLE ABDOMEN - 1 VIEW  COMPARISON:  CT 06/21/2014  FINDINGS: There is a double-J left ureteral stent which appears to be appropriately positioned. In addition, there is a surgical drain in the pelvis. Surgical skin staples in the lower abdomen and pelvis. There is gas within the small bowel and large bowel. This is a nonobstructive bowel gas pattern. Mild degenerative changes in both hips. Limited evaluation for free air on this supine image.  IMPRESSION: Nonobstructive bowel gas pattern.  Postsurgical changes in the abdomen.   Electronically Signed   By: Richarda Overlie M.D.   On: 06/29/2014 13:10     Anti-infectives: Anti-infectives    Start     Dose/Rate Route Frequency Ordered Stop   06/22/14 2230  piperacillin-tazobactam (ZOSYN) IVPB 3.375 g     3.375 g 12.5 mL/hr over 240 Minutes Intravenous Every 8 hours 06/22/14 2221     06/22/14 2030  ciprofloxacin (CIPRO) IVPB 400 mg  Status:  Discontinued     400 mg 200 mL/hr over 60 Minutes Intravenous  Once 06/22/14 2029 06/22/14 2351   06/22/14 2030  metroNIDAZOLE (FLAGYL) IVPB 500 mg     500 mg 100 mL/hr over 60 Minutes Intravenous  Once 06/22/14 2029 06/22/14 2141      Assessment/Plan: s/p Procedure(s): BOARI FLAP LEFT URETERAL REIMPLANT (Left) LOOP ILEOSTOMY (N/A) SIGMOID COLECTOMY WITH BLADDER REPAIR (N/A) Advance diet to FLD Ambulate Con't JP Decrease IVF  LOS: 9 days    Yvonne Ehlers., Yvonne Espinoza 07/01/2014

## 2014-07-02 MED ORDER — PANTOPRAZOLE SODIUM 40 MG PO TBEC
40.0000 mg | DELAYED_RELEASE_TABLET | Freq: Every day | ORAL | Status: DC
  Administered 2014-07-03 – 2014-07-05 (×3): 40 mg via ORAL
  Filled 2014-07-02 (×3): qty 1

## 2014-07-02 NOTE — Progress Notes (Signed)
6 Days Post-Op  Subjective: PT doing well tol PO  Objective: Vital signs in last 24 hours: Temp:  [98 F (36.7 C)-98.9 F (37.2 C)] 98.9 F (37.2 C) (02/28 0658) Pulse Rate:  [75-84] 80 (02/28 0658) Resp:  [16-18] 16 (02/28 0658) BP: (112-144)/(42-47) 138/46 mmHg (02/28 0658) SpO2:  [96 %-99 %] 96 % (02/28 0658) Last BM Date: 07/01/14  Intake/Output from previous day: 02/27 0701 - 02/28 0700 In: 2604.7 [P.O.:960; I.V.:1520.7; IV Piggyback:124] Out: 3965 [Urine:3025; Drains:340; Stool:600] Intake/Output this shift:    General appearance: alert and cooperative GI: soft, ostomy pink/patent, wound c/d/i, JP SS  Lab Results:   Recent Labs  06/30/14 0443 07/01/14 0500  WBC 11.7* 10.8*  HGB 10.2* 10.5*  HCT 31.7* 32.9*  PLT 392 402*   BMET  Recent Labs  06/30/14 0443 07/01/14 0500  NA 135 136  K 3.8 4.2  CL 105 103  CO2 25 27  GLUCOSE 131* 136*  BUN <5* <5*  CREATININE 0.55 0.58  CALCIUM 7.9* 8.3*    Anti-infectives: Anti-infectives    Start     Dose/Rate Route Frequency Ordered Stop   06/22/14 2230  piperacillin-tazobactam (ZOSYN) IVPB 3.375 g     3.375 g 12.5 mL/hr over 240 Minutes Intravenous Every 8 hours 06/22/14 2221     06/22/14 2030  ciprofloxacin (CIPRO) IVPB 400 mg  Status:  Discontinued     400 mg 200 mL/hr over 60 Minutes Intravenous  Once 06/22/14 2029 06/22/14 2351   06/22/14 2030  metroNIDAZOLE (FLAGYL) IVPB 500 mg     500 mg 100 mL/hr over 60 Minutes Intravenous  Once 06/22/14 2029 06/22/14 2141      Assessment/Plan: s/p Procedure(s): BOARI FLAP LEFT URETERAL REIMPLANT (Left) LOOP ILEOSTOMY (N/A) SIGMOID COLECTOMY WITH BLADDER REPAIR (N/A) Advance diet  Mobilize Left JP in place. JP fluid analysis pending  LOS: 10 days    Marigene Ehlers., [facility] 07/02/2014

## 2014-07-02 NOTE — Progress Notes (Signed)
Pt c/o indigestion after ice cream.   Looks good.   Abd soft, NT.   POD #6 s/p Procedure(s): BOARI FLAP LEFT URETERAL REIMPLANT (Left) LOOP ILEOSTOMY (N/A) SIGMOID COLECTOMY WITH BLADDER REPAIR (N/A) -I sent JP fluid again due to high output and there is no evidence of urine leak. OK to D/c JP from GU pt of view. -continue foley, F/u Dr. Retta Diones for cystogram

## 2014-07-03 ENCOUNTER — Inpatient Hospital Stay (HOSPITAL_COMMUNITY): Payer: Medicare Other

## 2014-07-03 MED ORDER — BOOST PLUS PO LIQD
237.0000 mL | Freq: Three times a day (TID) | ORAL | Status: DC
  Administered 2014-07-04 – 2014-07-05 (×2): 237 mL via ORAL
  Filled 2014-07-03 (×12): qty 237

## 2014-07-03 NOTE — Clinical Social Work Note (Signed)
Spoke to patient and her daughter who said she would not like to go to [facility], she would prefer to go home with home health.  If patient is still recommended to go to SNF, they would prefer to go to [facility] instead.  Received phone call from [facility] who said patient has been authorized for SNF, and to contact once patient is ready to discharge.  Ervin Knack. Anara Cowman, MSW, [facility] [phone] 07/03/2014 5:40 PM

## 2014-07-03 NOTE — Progress Notes (Signed)
NUTRITION FOLLOW UP  Intervention:   -Boost Plus TID, each supplement provides 360 kcals and 14 grams protein  Nutrition Dx:   Inadequate oral intake related to altered GI function as evidenced by nausea and vomiting, PO: 50%; ongoing  Goal:   Pt will meet >90% of estimated nutritional needs; unmet  Monitor:   Diet advancement, PO intake, labs, weight changes, I/O's  Assessment:   Yvonne Espinoza is a 74 y.o. female presents with diverticulitis. Patient states that she has had similar issues dating back to December of 2015. Patient states that she has been having diarrhea forr about 6 months which has been going off and on and she has not been doing any better.  S/p Procedure(s) on 06/26/14: BOARI FLAP LEFT URETERAL REIMPLANT LOOP ILEOSTOMY SIGMOID COLECTOMY  BLADDER REPAIR  Per MD notes, JP fluid being analyzed due to high output; no evidence of urine leak. Pt reports that she tolerated liquids well, but is having difficulty with solid foods. She reports that she was unable to eat her chicken yesterday, but ate her mashed potatoes and carrots. Breakfast tray was untouched. Pt shared that she just vomited and has been feeling nauseous over the past 24 hours.  Offered supplement- pt reports she did not try the [employer] when offered because of her nausea and vomiting. She drinks Boost at home and requested chocolate Boost- RD to order. Discussed importance of good PO intake to promote healing. Encouraged pt to drink supplements and consume food on meal trays to optimal nutritional intake.  Pt reports she will likely go home with home health at discharge.  Labs reviewed. BUN <5, Calcium: 8.3, Glucose: 136.  Height: Ht Readings from Last 1 Encounters:  06/22/14 5' (1.524 m)    Weight Status:   Wt Readings from Last 1 Encounters:  06/22/14 116 lb 2.9 oz (52.7 kg)    Re-estimated needs:  Kcal: 1500-1700 Protein: 60-70 grams Fluid: 1.5-1.7 L  Skin: JP drain to lt  medial abdomen, closed abdominal incision, RLQ colostomy  Diet Order: DIET SOFT   Intake/Output Summary (Last 24 hours) at 07/03/14 0907 Last data filed at 07/03/14 0700  Gross per 24 hour  Intake   1957 ml  Output   2215 ml  Net   -258 ml    Last BM: 06/26/14   Labs:   Recent Labs Lab 06/29/14 0438 06/30/14 0443 07/01/14 0500  NA 134* 135 136  K 4.0 3.8 4.2  CL 100 105 103  CO2 23 25 27   BUN <5* <5* <5*  CREATININE 0.52 0.55 0.58  CALCIUM 8.2* 7.9* 8.3*  GLUCOSE 202* 131* 136*    CBG (last 3)  No results for input(s): GLUCAP in the last 72 hours.  Scheduled Meds: . enoxaparin (LOVENOX) injection  40 mg Subcutaneous Q24H  . pantoprazole  40 mg Oral Daily  . piperacillin-tazobactam (ZOSYN)  IV  3.375 g Intravenous Q8H    Continuous Infusions: . dextrose 5 % and 0.45 % NaCl with KCl 20 mEq/L 50 mL/hr at 07/02/14 1516    Yvonne Klaus A. 07/04/14, RD, LDN, CDE Pager: [phone] After hours Pager: [phone]

## 2014-07-03 NOTE — Progress Notes (Signed)
7 Days Post-Op Subjective: Patient reports nausea/emesis this am. Had some leakage from around the catheter  Objective: Vital signs in last 24 hours: Temp:  [98.7 F (37.1 C)-99.3 F (37.4 C)] 98.9 F (37.2 C) (02/29 0545) Pulse Rate:  [86-101] 101 (02/29 0545) Resp:  [16-17] 17 (02/29 0545) BP: (117-170)/(28-80) 170/80 mmHg (02/29 0545) SpO2:  [98 %-100 %] 100 % (02/29 0545)  Intake/Output from previous day: 02/28 0701 - 02/29 0700 In: 2317 [P.O.:840; I.V.:1377; IV Piggyback:100] Out: 2215 [Urine:1750; Drains:190; Stool:275] Intake/Output this shift:    Physical Exam:  Constitutional: Vital signs reviewed. WD WN in NAD   Eyes: PERRL, No scleral icterus.   Cardiovascular: RRR Pulmonary/Chest: Normal effort   Lab Results:  Recent Labs  07/01/14 0500  HGB 10.5*  HCT 32.9*   BMET  Recent Labs  07/01/14 0500  NA 136  K 4.2  CL 103  CO2 27  GLUCOSE 136*  BUN <5*  CREATININE 0.58  CALCIUM 8.3*   No results for input(s): LABPT, INR in the last 72 hours. No results for input(s): LABURIN in the last 72 hours. Results for orders placed or performed during the hospital encounter of 06/22/14  Clostridium Difficile by PCR     Status: None   Collection Time: 06/23/14  7:32 AM  Result Value Ref Range Status   C difficile by pcr NEGATIVE NEGATIVE Final    Studies/Results: No results found.  Assessment/Plan:   POD 7 sigmoid colectomy, left ureteral reimplant/Boari flap. Urologically doing well. Safe to have a cystogram in 2 days if still here, o/w can arrange for one as an outpt   LOS: 11 days   Marcine Matar M 07/03/2014, 7:48 AM

## 2014-07-03 NOTE — Progress Notes (Signed)
Physical Therapy Treatment Patient Details Name: Yvonne Espinoza MRN: [id number] DOB: 09/09/1940 Today's Date: 07/03/2014    History of Present Illness Pt is a 74 yo female presenting with Sigmoid diverticulitis with colo-vesicle fistula, left ureteral transection. Pt underwent boari flap left ureteral reimplant, loo ileostomy, sigmoid colectomy, bladder repair (dr. Lindie Spruce.).    PT Comments    Pt feeling nauseous and not well today. Declined any ambulation secondary to above. Agreeable to get out of bed and sit in chair for first time today. Difficulty donning socks requiring assist. Tolerated some therex. Not able to safely assess if pt able to go home at this time due to limited mobility. Sounds like pt's husband is her support system and he is currently at [facility] for rehab s/p TKA. Discharge recommendation updated to [facility] SNF to improve overall functional mobility and maximize independence.   Follow Up Recommendations  SNF     Equipment Recommendations  Rolling walker with 5" wheels    Recommendations for Other Services       Precautions / Restrictions Precautions Precautions: Fall Restrictions Weight Bearing Restrictions: No    Mobility  Bed Mobility Overal bed mobility: Needs Assistance Bed Mobility: Rolling;Sidelying to Sit Rolling: Supervision Sidelying to sit: Supervision;HOB elevated       General bed mobility comments: Use of rails. Able to perform without physical assist.   Transfers Overall transfer level: Needs assistance Equipment used: Rolling walker (2 wheeled) Transfers: Sit to/from Stand Sit to Stand: Supervision         General transfer comment: Supervision for safety as pt complaining of dizziness. No BP taken.  Ambulation/Gait Ambulation/Gait assistance: Min guard Ambulation Distance (Feet): 6 Feet Assistive device: Rolling walker (2 wheeled) Gait Pattern/deviations: Step-through pattern;Decreased stride length   Gait velocity  interpretation: Below normal speed for age/gender General Gait Details: Pt ambulated to chair, declined any further ambulation today. + dizziness.    Stairs            Wheelchair Mobility    Modified Rankin (Stroke Patients Only)       Balance Overall balance assessment: Needs assistance Sitting-balance support: Feet supported;No upper extremity supported Sitting balance-Leahy Scale: Fair Sitting balance - Comments: Difficulty donning socks sitting EOB reaching outside BoS. Required assist to donn 1 sock and pt able to initiate donning second sock but require assist to finish.   Standing balance support: During functional activity Standing balance-Leahy Scale: Fair                      Cognition Arousal/Alertness: Awake/alert Behavior During Therapy: WFL for tasks assessed/performed Overall Cognitive Status: Within Functional Limits for tasks assessed                      Exercises General Exercises - Lower Extremity Ankle Circles/Pumps: Both;20 reps;Seated Long Arc Quad: Both;20 reps;Seated Hip Flexion/Marching: Both;15 reps;Seated    General Comments        Pertinent Vitals/Pain Pain Assessment: No/denies pain (just complains of nausea.)    Home Living                      Prior Function            PT Goals (current goals can now be found in the care plan section) Progress towards PT goals: Not progressing toward goals - comment (due to nausea, dizziness.)    Frequency  Min 3X/week    PT Plan Discharge plan needs  to be updated    Co-evaluation             End of Session Equipment Utilized During Treatment: Gait belt Activity Tolerance: Patient limited by fatigue (nausea) Patient left: in chair;with call bell/phone within reach     Time: 1315-1330 PT Time Calculation (min) (ACUTE ONLY): 15 min  Charges:  $Therapeutic Activity: 8-22 mins                    G CodesAlvie Heidelberg A 07-14-2014, 1:35  PM Alvie Heidelberg, PT, DPT [phone]

## 2014-07-03 NOTE — Consult Note (Signed)
WOC ostomy follow up Pouch intact with good seal, applied on Friday. Mod amt semi-formed brown stool.  Supplies at bedside for staff nurse use. Pt nauseated today.  Will assist with change pouch tomorrow. Cammie Mcgee MSN, RN, CWOCN, [place], CNS [phone]

## 2014-07-03 NOTE — Progress Notes (Signed)
7 Days Post-Op  Subjective: Emesis and nausea overnight after diet advanced, feels poor this am  Objective: Vital signs in last 24 hours: Temp:  [98.7 F (37.1 C)-99.3 F (37.4 C)] 98.9 F (37.2 C) (02/29 0545) Pulse Rate:  [86-101] 101 (02/29 0545) Resp:  [16-17] 17 (02/29 0545) BP: (117-170)/(28-80) 170/80 mmHg (02/29 0545) SpO2:  [98 %-100 %] 100 % (02/29 0545) Last BM Date: 07/03/14  Intake/Output from previous day: 02/28 0701 - 02/29 0700 In: 2317 [P.O.:840; I.V.:1377; IV Piggyback:100] Out: 2215 [Urine:1750; Drains:190; Stool:275] Intake/Output this shift:    General appearance: fatigued Cardio: regular rate and rhythm GI: stoma pink with air and stool in bag, drain with cloudy fluid, approp tender, she does have bs  Lab Results:   Recent Labs  07/01/14 0500  WBC 10.8*  HGB 10.5*  HCT 32.9*  PLT 402*   BMET  Recent Labs  07/01/14 0500  NA 136  K 4.2  CL 103  CO2 27  GLUCOSE 136*  BUN <5*  CREATININE 0.58  CALCIUM 8.3*    Anti-infectives: Anti-infectives    Start     Dose/Rate Route Frequency Ordered Stop   06/22/14 2230  piperacillin-tazobactam (ZOSYN) IVPB 3.375 g     3.375 g 12.5 mL/hr over 240 Minutes Intravenous Every 8 hours 06/22/14 2221     06/22/14 2030  ciprofloxacin (CIPRO) IVPB 400 mg  Status:  Discontinued     400 mg 200 mL/hr over 60 Minutes Intravenous  Once 06/22/14 2029 06/22/14 2351   06/22/14 2030  metroNIDAZOLE (FLAGYL) IVPB 500 mg     500 mg 100 mL/hr over 60 Minutes Intravenous  Once 06/22/14 2029 06/22/14 2141      Assessment/Plan: Sigmoid diverticulitis with colo-vesicle fistula, left ureteral transection POD #7 s/p BOARI FLAP LEFT URETERAL REIMPLANT, LOOP ILEOSTOMY, SIGMOID COLECTOMY, BLADDER REPAIR (Dr. Lindie Spruce)  1. Will check kub this am due to symptoms overnight, leave on clears, follow up later 2. abx- she should only need 7 days postop abx unless other indication by hospitalists 3. Cont lovenox 4.  pt   Yvonne Espinoza 07/03/2014

## 2014-07-03 NOTE — Clinical Social Work Note (Signed)
CSW faxed clinicals to [place] insurance to begin process of insurance authorization.  Patient would like to go to [facility] once she is medically ready and discharge orders have been received.  CSW will continue to follow patient for discharge planning.  Ervin Knack. Valicia Rief, MSW, Theresia Majors [phone] 07/03/2014 2:15 PM

## 2014-07-04 MED ORDER — SODIUM CHLORIDE 0.9 % IV SOLN: 250.0000 mL | INTRAVENOUS | Status: DC | PRN

## 2014-07-04 MED ORDER — SODIUM CHLORIDE 0.9 % IJ SOLN
3.0000 mL | Freq: Two times a day (BID) | INTRAMUSCULAR | Status: DC
  Administered 2014-07-04 – 2014-07-05 (×2): 3 mL via INTRAVENOUS

## 2014-07-04 MED ORDER — SODIUM CHLORIDE 0.9 % IJ SOLN: 3.0000 mL | INTRAMUSCULAR | Status: DC | PRN

## 2014-07-04 NOTE — Progress Notes (Signed)
Patient ID: Faylene Million Wion, female   DOB: 04-02-1941, 74 y.o.   MRN: [id number]     [facility]      [address]   [place], [place] [address]    Phone: [phone] FAX: [phone]     Subjective: Feeling better.  Good ostomy output. Afebrile.  VSS.  No further nausea or vomiting.   Objective:  Vital signs:  Filed Vitals:   07/03/14 0545 07/03/14 1500 07/03/14 1900 07/04/14 0608  BP: 170/80 127/65 126/42 118/42  Pulse: 101 78 92 76  Temp: 98.9 F (37.2 C) 98.2 F (36.8 C) 98.4 F (36.9 C) 97.9 F (36.6 C)  TempSrc: Oral Oral Oral Oral  Resp: 17 16 17 18   Height:      Weight:      SpO2: 100% 100% 98% 97%    Last BM Date: 07/03/14  Intake/Output   Yesterday:  02/29 0701 - 03/01 0700 In: 1800 [P.O.:600; I.V.:1150; IV Piggyback:50] Out: 3115 [Urine:1500; Drains:165; Stool:1450] This shift:    I/O last 3 completed shifts: In: 2477 [P.O.:600; I.V.:1827; IV Piggyback:50] Out: 4080 [Urine:2200; Drains:255; Stool:1625]    Physical Exam: General: Pt awake/alert/oriented x4 in  acute distress Chest: cta. No chest wall pain w good excursion CV:  Pulses intact.  Regular rhythm Abdomen: Soft.  Nondistended.  Appropriately tender.  JP drain with serosanguinous output.  Stoma is pink and viable, ostomy with function.   No evidence of peritonitis.  No incarcerated hernias. Ext:  SCDs BLE.  No mjr edema.  No cyanosis Skin: No petechiae / purpura   Problem List:   Active Problems:   Diverticulitis   Diverticulitis of intestine with abscess   Leukocytosis   Hypokalemia    Results:   Labs: No results found for this or any previous visit (from the past 48 hour(s)).  Imaging / Studies: Dg Abd Portable 1v  07/03/2014   CLINICAL DATA:  Ileus following gastrointestinal surgery  EXAM: PORTABLE ABDOMEN - 1 VIEW  COMPARISON:  06/29/2014  FINDINGS: Mild persistent gaseous distention of the colon and stomach. There is no  evidence of pneumoperitoneum, portal venous gas or pneumatosis. There are no pathologic calcifications along the expected course of the ureters. There is a left nephroureteral stent in unchanged position. There is a surgical drain in the pelvis. There are anterior abdominal wall midline surgical skin staples. The osseous structures are unremarkable.  IMPRESSION: Nonobstructive bowel gas pattern, most compatible with improving ileus.   Electronically Signed   By: 07/01/2014   On: 07/03/2014 10:58    Medications / Allergies:  Scheduled Meds: . enoxaparin (LOVENOX) injection  40 mg Subcutaneous Q24H  . lactose free nutrition  237 mL Oral TID WC  . pantoprazole  40 mg Oral Daily  . piperacillin-tazobactam (ZOSYN)  IV  3.375 g Intravenous Q8H   Continuous Infusions: . dextrose 5 % and 0.45 % NaCl with KCl 20 mEq/L 50 mL/hr at 07/03/14 1133   PRN Meds:.acetaminophen **OR** acetaminophen, calcium carbonate, HYDROcodone-acetaminophen, HYDROmorphone (DILAUDID) injection, ondansetron **OR** ondansetron (ZOFRAN) IV, promethazine  Antibiotics: Anti-infectives    Start     Dose/Rate Route Frequency Ordered Stop   06/22/14 2230  piperacillin-tazobactam (ZOSYN) IVPB 3.375 g     3.375 g 12.5 mL/hr over 240 Minutes Intravenous Every 8 hours 06/22/14 2221     06/22/14 2030  ciprofloxacin (CIPRO) IVPB 400 mg  Status:  Discontinued     400 mg 200 mL/hr over 60 Minutes Intravenous  Once  06/22/14 2029 06/22/14 2351   06/22/14 2030  metroNIDAZOLE (FLAGYL) IVPB 500 mg     500 mg 100 mL/hr over 60 Minutes Intravenous  Once 06/22/14 2029 06/22/14 2141        Assessment/Plan Sigmoid diverticulitis with colo-vesicle fistula, left ureteral transection POD #8 s/p Boari flap left ureteral reimplant loop ileostomy sigmoid colectomy and bladder repair (Dr. Lindie Spruce) -advance to a soft diet(doesnt like fulls).  Advised to go slow -SL IV -pain is controlled with PO -ostomy care/teaching/WOC following -JP drain  being removed today per urology -cystogram per urology tomorrow -zosyn D#12---ok to DC? VTE prophylaxis -SCD/lovenox Dispo--anticipate DC tomorrow after cystogram.  DC home, neighbors and daughter to help.  PT recommends 24h assist, rolling walker.  HH nursing for wound and ostomy care   Ashok Norris, [facility] Surgery Pager [phone](7A-4:30P) For consults and floor pages call [phone](7A-4:30P)  07/04/2014 8:27 AM

## 2014-07-04 NOTE — Care Management (Signed)
An Important Message From Medicare About Your Rights , given to patient . Tresten Pantoja RN BSN              

## 2014-07-04 NOTE — Progress Notes (Signed)
8 Days Post-Op Subjective: Patient reports that her abdomen feels better today. She has less nausea. She is having no bladder pain.  Objective: Vital signs in last 24 hours: Temp:  [97.9 F (36.6 C)-98.4 F (36.9 C)] 97.9 F (36.6 C) (03/01 7829) Pulse Rate:  [76-92] 76 (03/01 0608) Resp:  [16-18] 18 (03/01 0608) BP: (118-127)/(42-65) 118/42 mmHg (03/01 0608) SpO2:  [97 %-100 %] 97 % (03/01 0608)  Intake/Output from previous day: 02/29 0701 - 03/01 0700 In: 1160 [P.O.:360; I.V.:750; IV Piggyback:50] Out: 3115 [Urine:1500; Drains:165; Stool:1450] Intake/Output this shift: Total I/O In: 200 [I.V.:200] Out: 1510 [Urine:850; Drains:60; Stool:600]  Physical Exam:  Constitutional: Vital signs reviewed. WD WN in NAD   Eyes: PERRL, No scleral icterus.   Pulmonary/Chest: Normal effort  Urine in bag is clear  Lab Results: No results for input(s): HGB, HCT in the last 72 hours. BMET No results for input(s): NA, K, CL, CO2, GLUCOSE, BUN, CREATININE, CALCIUM in the last 72 hours. No results for input(s): LABPT, INR in the last 72 hours. No results for input(s): LABURIN in the last 72 hours. Results for orders placed or performed during the hospital encounter of 06/22/14  Clostridium Difficile by PCR     Status: None   Collection Time: 06/23/14  7:32 AM  Result Value Ref Range Status   C difficile by pcr NEGATIVE NEGATIVE Final    Studies/Results: Dg Abd Portable 1v  07/03/2014   CLINICAL DATA:  Ileus following gastrointestinal surgery  EXAM: PORTABLE ABDOMEN - 1 VIEW  COMPARISON:  06/29/2014  FINDINGS: Mild persistent gaseous distention of the colon and stomach. There is no evidence of pneumoperitoneum, portal venous gas or pneumatosis. There are no pathologic calcifications along the expected course of the ureters. There is a left nephroureteral stent in unchanged position. There is a surgical drain in the pelvis. There are anterior abdominal wall midline surgical skin staples. The  osseous structures are unremarkable.  IMPRESSION: Nonobstructive bowel gas pattern, most compatible with improving ileus.   Electronically Signed   By: Elige Ko   On: 07/03/2014 10:58    Assessment/Plan:   Postop day #8 from reimplant/Boari flap. She seems to be doing well.    I will remove her drain today. If she does not go home today, I will order cystogram for tomorrow.   LOS: 12 days   Marcine Matar M 07/04/2014, 6:57 AM

## 2014-07-04 NOTE — Consult Note (Signed)
WOC ostomy follow up Stoma type/location: RUQ ileostomy  Stomal assessment/size: Red and moist, oval, 1 1/4 inches, flush with skin level. Peristomal skin: Intact  Output:Mod amt brown liquid stool noted in bag. Ostomy pouching: 1pc.  Education provided: Pouch change performed by patient with minimal amt assistance using a barrier ring and one piece pouch. Patientis able to open and close the pouch to empty. Reviewed pouching routines, ordering supplies, and dietary precautions. Recommend home health assistance after discharge. Pt has been enrolled patient in [facility] Discharge program and 2 extra barrier rings and pouches and educational materials are at the bedside. Pt denies further questions at this time.  Cammie Mcgee MSN, RN, CWOCN, [place], CNS [phone]

## 2014-07-04 NOTE — Care Management Note (Signed)
  Page 1 of 1   07/05/2014     11:11:52 AM CARE MANAGEMENT NOTE 07/05/2014  Patient:  Yvonne Espinoza   Account Number:  [id number]  Date Initiated:  06/27/2014  Documentation initiated by:  Ronny Flurry  Subjective/Objective Assessment:     Action/Plan:   Anticipated DC Date:     Anticipated DC Plan:  HOME W HOME HEALTH SERVICES         Choice offered to / List presented to:  C-1 Patient        HH arranged  HH-1 RN  HH-2 PT      [facility] agency  [facility]   Status of service:  Completed, signed off Medicare Important Message given?  YES (If response is "NO", the following Medicare IM given date fields will be blank) Date Medicare IM given:  07/04/2014 Medicare IM given by:  Ronny Flurry Date Additional Medicare IM given:   Additional Medicare IM given by:    Discharge Disposition:  HOME W HOME HEALTH SERVICES  Per UR Regulation:  Reviewed for med. necessity/level of care/duration of stay  If discussed at Long Length of Stay Meetings, dates discussed:   06/27/2014  06/29/2014  07/04/2014    Comments:  07-04-14 Patient would like to go home at discharge with home helath ( [facility] ) HHRN/PT . Will need orders . Patient already has shower chair , walker, canes, wheelchair,  and crutches at home . Patient states she will have 24 hour supervision at home also. Ronny Flurry RN BSN [phone]

## 2014-07-04 NOTE — Progress Notes (Signed)
Physical Therapy Treatment Patient Details Name: Yvonne Espinoza MRN: [id number] DOB: October 17, 1940 Today's Date: 07/04/2014    History of Present Illness Pt is a 74 yo female presenting with Sigmoid diverticulitis with colo-vesicle fistula, left ureteral transection. Pt underwent boari flap left ureteral reimplant, loo ileostomy, sigmoid colectomy, bladder repair (dr. Lindie Spruce.).    PT Comments    Patient progressing well with mobility. Tolerated stair negotiation with min guard assist for safety. Ambulated without use of RW however balance improved with use RW for stability. Discussed disposition with patient and pt eager to return home instead of ST SNF. Only safe to do so if she has 24/7 S initially and HHPT. Will continue to follow acutely and progress according to POC.   Follow Up Recommendations  Home health PT;Supervision/Assistance - 24 hour     Equipment Recommendations  None recommended by PT    Recommendations for Other Services       Precautions / Restrictions Precautions Precautions: Fall Restrictions Weight Bearing Restrictions: No    Mobility  Bed Mobility Overal bed mobility: Needs Assistance Bed Mobility: Rolling;Sidelying to Sit Rolling: Supervision Sidelying to sit: Supervision;HOB elevated     Sit to sidelying: Supervision;HOB elevated General bed mobility comments: Use of rails. Able to perform without physical assist.   Transfers Overall transfer level: Needs assistance Equipment used: Rolling walker (2 wheeled);None Transfers: Sit to/from Stand Sit to Stand: Supervision         General transfer comment: Supervision for safety.   Ambulation/Gait Ambulation/Gait assistance: Supervision Ambulation Distance (Feet): 510 Feet Assistive device: Rolling walker (2 wheeled);None Gait Pattern/deviations: Step-through pattern;Decreased stride length;Drifts right/left;Trunk flexed     General Gait Details: Ambulated with and without RW. Balance  improved with use of RW while performing higher level balance challenges. Ambulated without AD- mild drifting noted but no LOB.   Stairs Stairs: Yes Stairs assistance: Min guard Stair Management: One rail Right;Step to pattern;Forwards Number of Stairs: 11 General stair comments: Cues for technique.   Wheelchair Mobility    Modified Rankin (Stroke Patients Only)       Balance Overall balance assessment: Needs assistance Sitting-balance support: Feet supported;No upper extremity supported Sitting balance-Leahy Scale: Good     Standing balance support: During functional activity Standing balance-Leahy Scale: Fair                      Cognition Arousal/Alertness: Awake/alert Behavior During Therapy: WFL for tasks assessed/performed Overall Cognitive Status: Within Functional Limits for tasks assessed                      Exercises      General Comments        Pertinent Vitals/Pain Pain Assessment: No/denies pain    Home Living                      Prior Function            PT Goals (current goals can now be found in the care plan section) Progress towards PT goals: Progressing toward goals    Frequency  Min 3X/week    PT Plan Discharge plan needs to be updated    Co-evaluation             End of Session Equipment Utilized During Treatment: Gait belt Activity Tolerance: Patient tolerated treatment well Patient left: in bed;with call bell/phone within reach     Time: 1459-1519 PT Time Calculation (min) (ACUTE ONLY): 20  min  Charges:  $Gait Training: 8-22 mins                    G CodesAlvie Heidelberg A July 22, 2014, 3:25 PM  Alvie Heidelberg, PT, DPT [phone]

## 2014-07-05 ENCOUNTER — Inpatient Hospital Stay (HOSPITAL_COMMUNITY): Payer: Medicare Other

## 2014-07-05 MED ORDER — IOHEXOL 300 MG/ML  SOLN
150.0000 mL | Freq: Once | INTRAMUSCULAR | Status: AC | PRN
  Administered 2014-07-05: 150 mL via INTRAVENOUS

## 2014-07-05 MED ORDER — ONDANSETRON HCL 4 MG PO TABS: 4.0000 mg | ORAL_TABLET | Freq: Four times a day (QID) | ORAL | Status: DC | PRN

## 2014-07-05 MED ORDER — IOHEXOL 300 MG/ML  SOLN: 50.0000 mL | Freq: Once | INTRAMUSCULAR | Status: DC | PRN

## 2014-07-05 MED ORDER — OXYBUTYNIN CHLORIDE 5 MG PO TABS: 5.0000 mg | ORAL_TABLET | Freq: Three times a day (TID) | ORAL | Status: DC | PRN

## 2014-07-05 MED ORDER — HYDROCODONE-ACETAMINOPHEN 5-325 MG PO TABS: 1.0000 | ORAL_TABLET | ORAL | Status: DC | PRN

## 2014-07-05 NOTE — Progress Notes (Signed)
Patient urinated about 450 post catheter removal and post cystogram, urine clear , no bleeding noted. Dr. Olivia Canter made aware and stated its ok to discharge patient.

## 2014-07-05 NOTE — Progress Notes (Signed)
Patient ID: Yvonne Espinoza, female   DOB: 05/27/1940, 74 y.o.   MRN: [id number]     [facility]      [address]   [place], [place] [address]    Phone: [phone] FAX: [phone]     Subjective: Tolerating POs.  Having bowel function.  VSS.  Afebrile.    Objective:  Vital signs:  Filed Vitals:   07/04/14 0608 07/04/14 1331 07/04/14 2229 07/05/14 0602  BP: 118/42 133/55 128/44 104/45  Pulse: 76 101 97 72  Temp: 97.9 F (36.6 C) 97.9 F (36.6 C) 98 F (36.7 C) 97.8 F (36.6 C)  TempSrc: Oral Oral Oral Oral  Resp: 18 18 17 16   Height:      Weight:      SpO2: 97% 98% 97% 98%    Last BM Date: 07/05/14  Intake/Output   Yesterday:  03/01 0701 - 03/02 0700 In: 881 [P.O.:600; I.V.:231; IV Piggyback:50] Out: 2750 [Urine:1900; Stool:850] This shift:    I/O last 3 completed shifts: In: 1721 [P.O.:840; I.V.:831; IV Piggyback:50] Out: 4260 [Urine:2750; Drains:60; Stool:1450]      Physical Exam: General: Pt awake/alert/oriented x4 in acute distress Chest: cta. No chest wall pain w good excursion CV: Pulses intact. Regular rhythm Abdomen: Soft. Nondistended. Appropriately tender. JP drain with serosanguinous output. Stoma is pink and viable, ostomy with function. No evidence of peritonitis. No incarcerated hernias. Ext: SCDs BLE. No mjr edema. No cyanosis Skin: No petechiae / purpura    Problem List:   Active Problems:   Diverticulitis   Diverticulitis of intestine with abscess   Leukocytosis   Hypokalemia    Results:   Labs: No results found for this or any previous visit (from the past 48 hour(s)).  Imaging / Studies: Dg Abd Portable 1v  07/03/2014   CLINICAL DATA:  Ileus following gastrointestinal surgery  EXAM: PORTABLE ABDOMEN - 1 VIEW  COMPARISON:  06/29/2014  FINDINGS: Mild persistent gaseous distention of the colon and stomach. There is no evidence of pneumoperitoneum, portal venous gas  or pneumatosis. There are no pathologic calcifications along the expected course of the ureters. There is a left nephroureteral stent in unchanged position. There is a surgical drain in the pelvis. There are anterior abdominal wall midline surgical skin staples. The osseous structures are unremarkable.  IMPRESSION: Nonobstructive bowel gas pattern, most compatible with improving ileus.   Electronically Signed   By: 07/01/2014   On: 07/03/2014 10:58    Medications / Allergies:  Scheduled Meds: . enoxaparin (LOVENOX) injection  40 mg Subcutaneous Q24H  . lactose free nutrition  237 mL Oral TID WC  . pantoprazole  40 mg Oral Daily  . sodium chloride  3 mL Intravenous Q12H   Continuous Infusions:  PRN Meds:.sodium chloride, acetaminophen **OR** acetaminophen, calcium carbonate, HYDROcodone-acetaminophen, HYDROmorphone (DILAUDID) injection, iohexol, ondansetron **OR** ondansetron (ZOFRAN) IV, promethazine, sodium chloride  Antibiotics: Anti-infectives    Start     Dose/Rate Route Frequency Ordered Stop   06/22/14 2230  piperacillin-tazobactam (ZOSYN) IVPB 3.375 g  Status:  Discontinued     3.375 g 12.5 mL/hr over 240 Minutes Intravenous Every 8 hours 06/22/14 2221 07/04/14 1013   06/22/14 2030  ciprofloxacin (CIPRO) IVPB 400 mg  Status:  Discontinued     400 mg 200 mL/hr over 60 Minutes Intravenous  Once 06/22/14 2029 06/22/14 2351   06/22/14 2030  metroNIDAZOLE (FLAGYL) IVPB 500 mg     500 mg 100 mL/hr over 60 Minutes Intravenous  Once 06/22/14 2029 06/22/14 2141       Assessment/Plan Sigmoid diverticulitis with colo-vesicle fistula, left ureteral transection POD #9 s/p Boari flap left ureteral reimplant loop ileostomy sigmoid colectomy and bladder repair (Dr. Lindie Spruce) -tolerating solids, pain controlled, has ostomy function, ambulating -cystogram per urology today, then DC foley -DC staples and apply steri strips -will plan to discharge after that -Rx for ditropan sent per  urology VTE prophylaxis -SCD/lovenox Dispo--anticipate DC today after cystogram and foley removal. HH orders placed  Ashok Norris, [facility] Surgery Pager [phone](7A-4:30P) For consults and floor pages call [phone](7A-4:30P)  07/05/2014 9:56 AM

## 2014-07-05 NOTE — Progress Notes (Signed)
Discharge patient . Home discharge instruction given to patient. No questions verbalized. [facility] in earlier this shift and stated everything okey with her home health needs.

## 2014-07-05 NOTE — Discharge Summary (Signed)
Physician Discharge Summary  Yvonne Espinoza OVZ:[id number] DOB: Apr 10, 1941 DOA: 06/22/2014  PCP: Lupe Carney, MD  Consultation: urology---Dr. Retta Diones  Admit date: 06/22/2014 Discharge date: 07/05/2014  Recommendations for Outpatient Follow-up:   Follow-up Information    Follow up with DAHLSTEDT, Bertram Millard, MD.   Specialty:  Urology   Why:  we will call you to set up appt   Contact information:   [address] [place] [address] [phone]       Follow up with Frederik Schmidt, MD On 07/18/2014.   Specialty:  General Surgery   Why:  arrive by 11:10AM for a 11:40AM   Contact information:   [address] [place] [address] [phone]      Discharge Diagnoses:  1. Sigmoid diverticulitis with colo-vesicle fistula 2. Left ureteral transection    Surgical Procedure: Boari flap left ureteral reimplant loop ileostomy sigmoid colectomy and bladder repair (Dr. Lindie Spruce)  Discharge Condition: stable Disposition: home  Diet recommendation: regular diet   Filed Weights   06/22/14 1703 06/22/14 2216  Weight: 114 lb 14.4 oz (52.118 kg) 116 lb 2.9 oz (52.7 kg)     Filed Vitals:   07/05/14 0602  BP: 104/45  Pulse: 72  Temp: 97.8 F (36.6 C)  Resp: 16     Hospital Course:  Yvonne Espinoza is a 74 year old female admitted with diverticulitis with colo-vesicle fistula.  She was admitted for IV antibiotics and underwent a loop ileostomy, left ureteral reimplant, sigmoid colectomy and bladder repair with the help of Dr. Retta Diones.  She tolerated the procedure well and was transferred to the floor. She was given 1u of pRBCs for a hgb of 7.7.  She had an expected ileus which resolved with bowel rest and her diet was advanced.  WOC was consulted for ostomy teaching.  Her antibiotic course was completed(12 days).  On POD#9 the patient had a cystogram which demonstrated no bladder leak therefore her foley was removed.  The patient voided following removed.    She was  tolerating a diet, having bowel function, ambulating and pain was well controlled.  She was therefore felt stable for discharge home. Medication risks, benefits and therapeutic alternatives were reviewed with the patient.  She verbalizes understanding.  Follow up was arranged. She was encouraged to call with questions or concerns.    Discharge Instructions     Medication List    STOP taking these medications        amoxicillin-clavulanate 875-125 MG per tablet  Commonly known as:  AUGMENTIN      TAKE these medications        BIOTIN PO  Take 10,000 mg by mouth daily.     folic acid 1 MG tablet  Commonly known as:  FOLVITE  Take 1 mg by mouth 3 (three) times daily.     Garlic 1000 MG Caps  Take 1,000 mg by mouth daily.     HYDROcodone-acetaminophen 5-325 MG per tablet  Commonly known as:  NORCO/VICODIN  Take 1-2 tablets by mouth every 4 (four) hours as needed for moderate pain.     HYDROcodone-acetaminophen 5-325 MG per tablet  Commonly known as:  NORCO/VICODIN  Take 1-2 tablets by mouth every 4 (four) hours as needed for moderate pain.     METHOTREXATE (PF) [place]  Inject 20 mg into the skin once a week. Take injection Every Friday.     omeprazole 20 MG capsule  Commonly known as:  PRILOSEC  Take 20 mg by  mouth daily.     ondansetron 4 MG tablet  Commonly known as:  ZOFRAN  Take 1 tablet (4 mg total) by mouth every 6 (six) hours as needed for nausea.     oxybutynin 5 MG tablet  Commonly known as:  DITROPAN  Take 1 tablet (5 mg total) by mouth every 8 (eight) hours as needed for bladder spasms.     WOMENS MULTIVITAMIN PLUS PO  Take 1 tablet by mouth daily.           Follow-up Information    Follow up with DAHLSTEDT, Bertram Millard, MD.   Specialty:  Urology   Why:  we will call you to set up appt   Contact information:   [address] [place] [address] [phone]       Follow up with Frederik Schmidt, MD On 07/18/2014.   Specialty:  General Surgery   Why:   arrive by 11:10AM for a 11:40AM   Contact information:   [address] [place] [address] [phone]        The results of significant diagnostics from this hospitalization (including imaging, microbiology, ancillary and laboratory) are listed below for reference.    Significant Diagnostic Studies: Dg Abd Portable 1v  07/03/2014   CLINICAL DATA:  Ileus following gastrointestinal surgery  EXAM: PORTABLE ABDOMEN - 1 VIEW  COMPARISON:  06/29/2014  FINDINGS: Mild persistent gaseous distention of the colon and stomach. There is no evidence of pneumoperitoneum, portal venous gas or pneumatosis. There are no pathologic calcifications along the expected course of the ureters. There is a left nephroureteral stent in unchanged position. There is a surgical drain in the pelvis. There are anterior abdominal wall midline surgical skin staples. The osseous structures are unremarkable.  IMPRESSION: Nonobstructive bowel gas pattern, most compatible with improving ileus.   Electronically Signed   By: Elige Ko   On: 07/03/2014 10:58   Dg Abd Portable 1v  06/29/2014   CLINICAL DATA:  Nausea and vomiting started this morning. Recent abdominal surgery for sigmoid diverticulitis.  EXAM: PORTABLE ABDOMEN - 1 VIEW  COMPARISON:  CT 06/21/2014  FINDINGS: There is a double-J left ureteral stent which appears to be appropriately positioned. In addition, there is a surgical drain in the pelvis. Surgical skin staples in the lower abdomen and pelvis. There is gas within the small bowel and large bowel. This is a nonobstructive bowel gas pattern. Mild degenerative changes in both hips. Limited evaluation for free air on this supine image.  IMPRESSION: Nonobstructive bowel gas pattern.  Postsurgical changes in the abdomen.   Electronically Signed   By: Richarda Overlie M.D.   On: 06/29/2014 13:10    Microbiology: No results found for this or any previous visit (from the past 240 hour(s)).   Labs: Basic Metabolic  Panel:  Recent Labs Lab 06/29/14 0438 06/30/14 0443 07/01/14 0500  NA 134* 135 136  K 4.0 3.8 4.2  CL 100 105 103  CO2 23 25 27   GLUCOSE 202* 131* 136*  BUN <5* <5* <5*  CREATININE 0.52 0.55 0.58  CALCIUM 8.2* 7.9* 8.3*   Liver Function Tests: No results for input(s): AST, ALT, ALKPHOS, BILITOT, PROT, ALBUMIN in the last 168 hours. No results for input(s): LIPASE, AMYLASE in the last 168 hours. No results for input(s): AMMONIA in the last 168 hours. CBC:  Recent Labs Lab 06/29/14 0438 06/30/14 0443 07/01/14 0500  WBC 14.6* 11.7* 10.8*  HGB 11.1* 10.2* 10.5*  HCT 33.8*  31.7* 32.9*  MCV 83.7 84.5 86.1  PLT 394 392 402*   Cardiac Enzymes: No results for input(s): CKTOTAL, CKMB, CKMBINDEX, TROPONINI in the last 168 hours. BNP: BNP (last 3 results) No results for input(s): BNP in the last 8760 hours.  ProBNP (last 3 results) No results for input(s): PROBNP in the last 8760 hours.  CBG:  Recent Labs Lab 06/28/14 1132 06/29/14 0744 06/29/14 1220  GLUCAP 182* 200* 186*    Active Problems:   Diverticulitis   Diverticulitis of intestine with abscess   Leukocytosis   Hypokalemia   Signed:  Mozell Haber, ANP-BC

## 2014-07-05 NOTE — Progress Notes (Signed)
Emina,PA called getting update with the patient, this writer still waiting call from urology, Anette Riedel ,[place] stated if urology in its [place] to discharge patient.

## 2014-07-05 NOTE — Progress Notes (Signed)
9 Days Post-Op Subjective: Patient reports feeling better  Objective: Vital signs in last 24 hours: Temp:  [97.8 F (36.6 C)-98 F (36.7 C)] 97.8 F (36.6 C) (03/02 0602) Pulse Rate:  [72-101] 72 (03/02 0602) Resp:  [16-18] 16 (03/02 0602) BP: (104-133)/(44-55) 104/45 mmHg (03/02 0602) SpO2:  [97 %-98 %] 98 % (03/02 0602)  Intake/Output from previous day: 03/01 0701 - 03/02 0700 In: 881 [P.O.:600; I.V.:231; IV Piggyback:50] Out: 2750 [Urine:1900; Stool:850] Intake/Output this shift: Total I/O In: -  Out: 1000 [Urine:550; Stool:450]  Physical Exam:  Constitutional: Vital signs reviewed. WD WN in NAD     Lab Results: No results for input(s): HGB, HCT in the last 72 hours. BMET No results for input(s): NA, K, CL, CO2, GLUCOSE, BUN, CREATININE, CALCIUM in the last 72 hours. No results for input(s): LABPT, INR in the last 72 hours. No results for input(s): LABURIN in the last 72 hours. Results for orders placed or performed during the hospital encounter of 06/22/14  Clostridium Difficile by PCR     Status: None   Collection Time: 06/23/14  7:32 AM  Result Value Ref Range Status   C difficile by pcr NEGATIVE NEGATIVE Final    Studies/Results: Dg Abd Portable 1v  07/03/2014   CLINICAL DATA:  Ileus following gastrointestinal surgery  EXAM: PORTABLE ABDOMEN - 1 VIEW  COMPARISON:  06/29/2014  FINDINGS: Mild persistent gaseous distention of the colon and stomach. There is no evidence of pneumoperitoneum, portal venous gas or pneumatosis. There are no pathologic calcifications along the expected course of the ureters. There is a left nephroureteral stent in unchanged position. There is a surgical drain in the pelvis. There are anterior abdominal wall midline surgical skin staples. The osseous structures are unremarkable.  IMPRESSION: Nonobstructive bowel gas pattern, most compatible with improving ileus.   Electronically Signed   By: Elige Ko   On: 07/03/2014 10:58     Assessment/Plan:   S/p ureteral reimplant/Boari flap. OK for cystogram today. Will have catheter removed if ok. Will arrange followup as outpt for stent removal in 2-3 weeks.  Will send in scrip for ditropan for bladder sx's if present after cath removal   LOS: 13 days   Marcine Matar M 07/05/2014, 6:33 AM

## 2014-07-05 NOTE — Discharge Instructions (Signed)

## 2014-07-05 NOTE — Clinical Social Work Note (Signed)
CSW received referral for SNF.  Case discussed with case manager and plan is to discharge home with home health once she is medically ready and discharge orders have been received.  CSW to sign off please re-consult if social work needs arise.  Ervin Knack. Nitza Schmid, MSW, [employer] [phone]

## 2014-08-15 ENCOUNTER — Other Ambulatory Visit: Payer: Self-pay | Admitting: General Surgery

## 2014-08-15 DIAGNOSIS — Z932 Ileostomy status: Secondary | ICD-10-CM

## 2014-08-17 ENCOUNTER — Ambulatory Visit
Admission: RE | Admit: 2014-08-17 | Discharge: 2014-08-17 | Disposition: A | Discharge status: Home / Self Care | Payer: Medicare Other | Source: Ambulatory Visit | Attending: General Surgery | Admitting: General Surgery

## 2014-09-13 ENCOUNTER — Ambulatory Visit: Payer: Self-pay | Admitting: General Surgery

## 2014-09-21 NOTE — Pre-Procedure Instructions (Signed)
Yvonne Espinoza  09/21/2014      [id number] - Yvonne Espinoza, Yvonne Espinoza - [address] AT [facility] [address] [place] [address] Phone: [phone] Fax: [phone]    Your procedure is scheduled on Tuesday, Sep 26, 2014  Report to [facility] Admitting at 5:30 A.M.  Call this number if you have problems the morning of surgery:  [phone]   Remember:  Do not eat food or drink liquids after midnight Monday, Sep 25, 2014  Take these medicines the morning of surgery with A SIP OF WATER:  Stop taking Aspirin, vitamins and herbal medications such as Garlic and BIOTIN.  Do not take any NSAIDs ie: Ibuprofen, Advil, Naproxen or any medication containing Aspirin; stop now.   Do not wear jewelry, make-up or nail polish.  Do not wear lotions, powders, or perfumes.  You may not wear deodorant.  Do not shave 48 hours prior to surgery.    Do not bring valuables to the hospital.  [facility] is not responsible for any belongings or valuables.  Contacts, dentures or bridgework may not be worn into surgery.  Leave your suitcase in the car.  After surgery it may be brought to your room.  For patients admitted to the hospital, discharge time will be determined by your treatment team.  Patients discharged the day of surgery will not be allowed to drive home.   Name and phone number of your driver:   Special instructions:  Special Instructions:Special Instructions: [facility] - Preparing for Surgery  Before surgery, you can play an important role.  Because skin is not sterile, your skin needs to be as free of germs as possible.  You can reduce the number of germs on you skin by washing with CHG (chlorahexidine gluconate) soap before surgery.  CHG is an antiseptic cleaner which kills germs and bonds with the skin to continue killing germs even after washing.  Please DO NOT use if you have an allergy to CHG or antibacterial soaps.  If your skin  becomes reddened/irritated stop using the CHG and inform your nurse when you arrive at Short Stay.  Do not shave (including legs and underarms) for at least 48 hours prior to the first CHG shower.  You may shave your face.  Please follow these instructions carefully:   1.  Shower with CHG Soap the night before surgery and the morning of Surgery.  2.  If you choose to wash your hair, wash your hair first as usual with your normal shampoo.  3.  After you shampoo, rinse your hair and body thoroughly to remove the Shampoo.  4.  Use CHG as you would any other liquid soap.  You can apply chg directly  to the skin and wash gently with scrungie or a clean washcloth.  5.  Apply the CHG Soap to your body ONLY FROM THE NECK DOWN.  Do not use on open wounds or open sores.  Avoid contact with your eyes, ears, mouth and genitals (private parts).  Wash genitals (private parts) with your normal soap.  6.  Wash thoroughly, paying special attention to the area where your surgery will be performed.  7.  Thoroughly rinse your body with warm water from the neck down.  8.  DO NOT shower/wash with your normal soap after using and rinsing off the CHG Soap.  9.  Pat yourself dry with a clean towel.  10.  Wear clean pajamas.            11.  Place clean sheets on your bed the night of your first shower and do not sleep with pets.  Day of Surgery  Do not apply any lotions/deodorants the morning of surgery.  Please wear clean clothes to the hospital/surgery center.  Please read over the following fact sheets that you were given. Pain Booklet, Coughing and Deep Breathing and Surgical Site Infection Prevention

## 2014-09-22 ENCOUNTER — Encounter (HOSPITAL_COMMUNITY)
Admission: RE | Admit: 2014-09-22 | Discharge: 2014-09-22 | Disposition: A | Discharge status: Home / Self Care | Payer: Medicare Other | Source: Ambulatory Visit | Attending: General Surgery | Admitting: General Surgery

## 2014-09-22 ENCOUNTER — Ambulatory Visit (HOSPITAL_COMMUNITY)
Admission: RE | Admit: 2014-09-22 | Discharge: 2014-09-22 | Disposition: A | Discharge status: Home / Self Care | Payer: Medicare Other | Source: Ambulatory Visit | Attending: General Surgery | Admitting: General Surgery

## 2014-09-22 ENCOUNTER — Encounter (HOSPITAL_COMMUNITY): Payer: Self-pay

## 2014-09-22 DIAGNOSIS — Z01818 Encounter for other preprocedural examination: Secondary | ICD-10-CM

## 2014-09-22 DIAGNOSIS — Z432 Encounter for attention to ileostomy: Secondary | ICD-10-CM | POA: Diagnosis not present

## 2014-09-22 HISTORY — DX: Cyst of kidney, acquired: N28.1

## 2014-09-22 HISTORY — DX: Cutaneous abscess, unspecified: L02.91

## 2014-09-22 HISTORY — DX: Presence of spectacles and contact lenses: Z97.3

## 2014-09-22 LAB — COMPREHENSIVE METABOLIC PANEL
ALT: 17 U/L (ref 14–54)
AST: 23 U/L (ref 15–41)
Albumin: 3.6 g/dL (ref 3.5–5.0)
Alkaline Phosphatase: 82 U/L (ref 38–126)
Anion gap: 8 (ref 5–15)
BUN: 11 mg/dL (ref 6–20)
CO2: 29 mmol/L (ref 22–32)
Calcium: 9.4 mg/dL (ref 8.9–10.3)
Chloride: 104 mmol/L (ref 101–111)
Creatinine, Ser: 0.7 mg/dL (ref 0.44–1.00)
GFR calc Af Amer: >60 mL/min (ref >60)
GLUCOSE: 100 mg/dL — AB (ref 65–99)
POTASSIUM: 4.1 mmol/L (ref 3.5–5.1)
SODIUM: 141 mmol/L (ref 135–145)
TOTAL PROTEIN: 7.1 g/dL (ref 6.5–8.1)
Total Bilirubin: 0.3 mg/dL (ref 0.3–1.2)

## 2014-09-22 LAB — CBC WITH DIFFERENTIAL/PLATELET
BASOS ABS: 0 10*3/uL (ref 0.0–0.1)
Basophils Relative: 1 % (ref 0–1)
EOS PCT: 5 % (ref 0–5)
Eosinophils Absolute: 0.4 10*3/uL (ref 0.0–0.7)
HCT: 40.3 % (ref 36.0–46.0)
Hemoglobin: 13 g/dL (ref 12.0–15.0)
LYMPHS ABS: 1.7 10*3/uL (ref 0.7–4.0)
LYMPHS PCT: 22 % (ref 12–46)
MCH: 28.1 pg (ref 26.0–34.0)
MCHC: 32.3 g/dL (ref 30.0–36.0)
MCV: 87.2 fL (ref 78.0–100.0)
Monocytes Absolute: 0.7 10*3/uL (ref 0.1–1.0)
Monocytes Relative: 9 % (ref 3–12)
NEUTROS ABS: 4.8 10*3/uL (ref 1.7–7.7)
NEUTROS PCT: 63 % (ref 43–77)
PLATELETS: 352 10*3/uL (ref 150–400)
RBC: 4.62 MIL/uL (ref 3.87–5.11)
RDW: 15.6 % — AB (ref 11.5–15.5)
WBC: 7.6 10*3/uL (ref 4.0–10.5)

## 2014-09-22 NOTE — Progress Notes (Signed)
Pt denies SOB and chest pain but is under the care of Dr. Bing Matter, cardiology at [facility]. Pt denies having a chest x ray and EKG within the last year. Pt denies having a cardiac cath, not sure if she had an echo and stated that a stress test was done by Dr. Bing Matter, cardiology; records requested. Pt PCP is Dr. Kateri Plummer in epic.

## 2014-09-25 MED ORDER — DEXTROSE 5 % IV SOLN
2.0000 g | INTRAVENOUS | Status: AC
  Administered 2014-09-26: 2 g via INTRAVENOUS
  Filled 2014-09-25 (×2): qty 2

## 2014-09-25 NOTE — Progress Notes (Signed)
Spoke with [facility] to follow up on faxed request for cardiac studies sent on Friday. They stated they were closed on Friday and will send notes today by 5pm.  Requested as soon as possible, please as surgery if first case tomorrow.

## 2014-09-26 ENCOUNTER — Inpatient Hospital Stay (HOSPITAL_COMMUNITY): Payer: Medicare Other | Admitting: Anesthesiology

## 2014-09-26 ENCOUNTER — Encounter (HOSPITAL_COMMUNITY): Payer: Self-pay | Admitting: *Deleted

## 2014-09-26 ENCOUNTER — Inpatient Hospital Stay (HOSPITAL_COMMUNITY)
Admission: RE | Admit: 2014-09-26 | Discharge: 2014-09-30 | DRG: 354 | Disposition: A | Discharge status: Home / Self Care | Payer: Medicare Other | Source: Ambulatory Visit | Attending: General Surgery | Admitting: General Surgery

## 2014-09-26 ENCOUNTER — Inpatient Hospital Stay (HOSPITAL_COMMUNITY): Payer: Medicare Other | Admitting: Vascular Surgery

## 2014-09-26 ENCOUNTER — Encounter (HOSPITAL_COMMUNITY)
Admission: RE | Disposition: A | Discharge status: Home / Self Care | Payer: Self-pay | Source: Ambulatory Visit | Attending: General Surgery

## 2014-09-26 DIAGNOSIS — Z432 Encounter for attention to ileostomy: Secondary | ICD-10-CM | POA: Diagnosis present

## 2014-09-26 DIAGNOSIS — K567 Ileus, unspecified: Secondary | ICD-10-CM | POA: Diagnosis not present

## 2014-09-26 DIAGNOSIS — Z932 Ileostomy status: Secondary | ICD-10-CM

## 2014-09-26 HISTORY — PX: COLOSTOMY REVERSAL: SHX5782

## 2014-09-26 HISTORY — PX: FLEXIBLE SIGMOIDOSCOPY: SHX5431

## 2014-09-26 HISTORY — PX: TRANSVERSE LOOP COLOSTOMY: SHX6478

## 2014-09-26 SURGERY — CREATION, COLOSTOMY, LOOP, TRANSVERSE COLON
Anesthesia: General | Site: Rectum

## 2014-09-26 MED ORDER — ONDANSETRON HCL 4 MG PO TABS: 4.0000 mg | ORAL_TABLET | Freq: Four times a day (QID) | ORAL | Status: DC | PRN

## 2014-09-26 MED ORDER — 0.9 % SODIUM CHLORIDE (POUR BTL) OPTIME
TOPICAL | Status: DC | PRN
  Administered 2014-09-26 (×2): 1000 mL

## 2014-09-26 MED ORDER — OXYBUTYNIN CHLORIDE 5 MG PO TABS: 5.0000 mg | ORAL_TABLET | Freq: Three times a day (TID) | ORAL | Status: DC | PRN

## 2014-09-26 MED ORDER — ONDANSETRON HCL 4 MG/2ML IJ SOLN
4.0000 mg | Freq: Four times a day (QID) | INTRAMUSCULAR | Status: DC | PRN
Start: 2014-09-26 — End: 2014-09-30
  Administered 2014-09-26 – 2014-09-29 (×7): 4 mg via INTRAVENOUS
  Filled 2014-09-26 (×7): qty 2

## 2014-09-26 MED ORDER — ROCURONIUM BROMIDE 100 MG/10ML IV SOLN
INTRAVENOUS | Status: DC | PRN
  Administered 2014-09-26: 40 mg via INTRAVENOUS

## 2014-09-26 MED ORDER — NEOSTIGMINE METHYLSULFATE 10 MG/10ML IV SOLN
INTRAVENOUS | Status: DC | PRN
  Administered 2014-09-26: 3 mg via INTRAVENOUS

## 2014-09-26 MED ORDER — SURGILUBE EX GEL
CUTANEOUS | Status: DC | PRN
  Administered 2014-09-26: 1 via TOPICAL

## 2014-09-26 MED ORDER — FENTANYL CITRATE (PF) 250 MCG/5ML IJ SOLN
INTRAMUSCULAR | Status: AC
  Filled 2014-09-26: qty 5

## 2014-09-26 MED ORDER — PROPOFOL 10 MG/ML IV BOLUS
INTRAVENOUS | Status: AC
  Filled 2014-09-26: qty 20

## 2014-09-26 MED ORDER — LIDOCAINE HCL (CARDIAC) 20 MG/ML IV SOLN
INTRAVENOUS | Status: DC | PRN
  Administered 2014-09-26: 100 mg via INTRAVENOUS

## 2014-09-26 MED ORDER — CEFOXITIN SODIUM 1 G IV SOLR
1.0000 g | Freq: Four times a day (QID) | INTRAVENOUS | Status: AC
  Administered 2014-09-26: 1 g via INTRAVENOUS
  Filled 2014-09-26: qty 1

## 2014-09-26 MED ORDER — ROCURONIUM BROMIDE 50 MG/5ML IV SOLN
INTRAVENOUS | Status: AC
  Filled 2014-09-26: qty 1

## 2014-09-26 MED ORDER — FENTANYL CITRATE (PF) 100 MCG/2ML IJ SOLN
INTRAMUSCULAR | Status: AC
  Administered 2014-09-26: 25 ug via INTRAVENOUS
  Filled 2014-09-26: qty 2

## 2014-09-26 MED ORDER — HYDROMORPHONE HCL 1 MG/ML IJ SOLN
0.5000 mg | INTRAMUSCULAR | Status: DC | PRN
  Administered 2014-09-26 – 2014-09-27 (×5): 1 mg via INTRAVENOUS
  Filled 2014-09-26 (×5): qty 1

## 2014-09-26 MED ORDER — FOLIC ACID 1 MG PO TABS
1.0000 mg | ORAL_TABLET | Freq: Three times a day (TID) | ORAL | Status: DC
  Filled 2014-09-26 (×3): qty 1

## 2014-09-26 MED ORDER — PROPOFOL 10 MG/ML IV BOLUS
INTRAVENOUS | Status: DC | PRN
  Administered 2014-09-26: 150 mg via INTRAVENOUS

## 2014-09-26 MED ORDER — ONDANSETRON HCL 4 MG/2ML IJ SOLN
INTRAMUSCULAR | Status: DC | PRN
  Administered 2014-09-26: 4 mg via INTRAVENOUS

## 2014-09-26 MED ORDER — MIDAZOLAM HCL 2 MG/2ML IJ SOLN
INTRAMUSCULAR | Status: AC
  Filled 2014-09-26: qty 2

## 2014-09-26 MED ORDER — ONDANSETRON HCL 4 MG PO TABS
4.0000 mg | ORAL_TABLET | Freq: Four times a day (QID) | ORAL | Status: DC | PRN
  Administered 2014-09-29: 4 mg via ORAL
  Filled 2014-09-26: qty 1

## 2014-09-26 MED ORDER — LACTATED RINGERS IV SOLN
INTRAVENOUS | Status: DC | PRN
  Administered 2014-09-26 (×2): via INTRAVENOUS

## 2014-09-26 MED ORDER — POVIDONE-IODINE 10 % EX OINT
TOPICAL_OINTMENT | CUTANEOUS | Status: AC
  Filled 2014-09-26: qty 28.35

## 2014-09-26 MED ORDER — POVIDONE-IODINE 10 % EX OINT
TOPICAL_OINTMENT | CUTANEOUS | Status: DC | PRN
  Administered 2014-09-26: 1 via TOPICAL

## 2014-09-26 MED ORDER — KCL IN DEXTROSE-NACL 20-5-0.45 MEQ/L-%-% IV SOLN
INTRAVENOUS | Status: DC
  Administered 2014-09-26 – 2014-09-28 (×4): via INTRAVENOUS
  Filled 2014-09-26 (×7): qty 1000

## 2014-09-26 MED ORDER — FENTANYL CITRATE (PF) 100 MCG/2ML IJ SOLN
25.0000 ug | INTRAMUSCULAR | Status: DC | PRN
  Administered 2014-09-26: 25 ug via INTRAVENOUS
  Administered 2014-09-26: 50 ug via INTRAVENOUS
  Administered 2014-09-26 (×3): 25 ug via INTRAVENOUS

## 2014-09-26 MED ORDER — HYDROCODONE-ACETAMINOPHEN 5-325 MG PO TABS: 1.0000 | ORAL_TABLET | ORAL | Status: DC | PRN

## 2014-09-26 MED ORDER — NEOSTIGMINE METHYLSULFATE 10 MG/10ML IV SOLN
INTRAVENOUS | Status: AC
  Filled 2014-09-26: qty 1

## 2014-09-26 MED ORDER — ONDANSETRON HCL 4 MG/2ML IJ SOLN
4.0000 mg | Freq: Once | INTRAMUSCULAR | Status: DC | PRN
Start: 2014-09-26 — End: 2014-09-26

## 2014-09-26 MED ORDER — ONDANSETRON HCL 4 MG/2ML IJ SOLN
INTRAMUSCULAR | Status: AC
  Filled 2014-09-26: qty 2

## 2014-09-26 MED ORDER — LIDOCAINE HCL (CARDIAC) 20 MG/ML IV SOLN
INTRAVENOUS | Status: AC
  Filled 2014-09-26: qty 5

## 2014-09-26 MED ORDER — SODIUM CHLORIDE 0.9 % IV SOLN
10.0000 mg | INTRAVENOUS | Status: DC | PRN
  Administered 2014-09-26: 25 ug/min via INTRAVENOUS

## 2014-09-26 MED ORDER — GLYCOPYRROLATE 0.2 MG/ML IJ SOLN
INTRAMUSCULAR | Status: DC | PRN
  Administered 2014-09-26: 0.4 mg via INTRAVENOUS

## 2014-09-26 MED ORDER — MIDAZOLAM HCL 5 MG/5ML IJ SOLN
INTRAMUSCULAR | Status: DC | PRN
  Administered 2014-09-26: 1 mg via INTRAVENOUS

## 2014-09-26 MED ORDER — CHLORHEXIDINE GLUCONATE 4 % EX LIQD: 1.0000 "application " | Freq: Once | CUTANEOUS | Status: DC

## 2014-09-26 MED ORDER — FENTANYL CITRATE (PF) 100 MCG/2ML IJ SOLN
INTRAMUSCULAR | Status: AC
  Administered 2014-09-26: 50 ug via INTRAVENOUS
  Filled 2014-09-26: qty 2

## 2014-09-26 MED ORDER — ENOXAPARIN SODIUM 40 MG/0.4ML ~~LOC~~ SOLN
40.0000 mg | SUBCUTANEOUS | Status: DC
  Filled 2014-09-26 (×3): qty 0.4

## 2014-09-26 MED ORDER — GLYCOPYRROLATE 0.2 MG/ML IJ SOLN
INTRAMUSCULAR | Status: AC
  Filled 2014-09-26: qty 2

## 2014-09-26 MED ORDER — FENTANYL CITRATE (PF) 100 MCG/2ML IJ SOLN
INTRAMUSCULAR | Status: DC | PRN
  Administered 2014-09-26: 50 ug via INTRAVENOUS
  Administered 2014-09-26: 150 ug via INTRAVENOUS
  Administered 2014-09-26: 50 ug via INTRAVENOUS

## 2014-09-26 MED ORDER — FLEET ENEMA 7-19 GM/118ML RE ENEM: 1.0000 | ENEMA | Freq: Once | RECTAL | Status: DC

## 2014-09-26 SURGICAL SUPPLY — 58 items
BLADE SURG ROTATE 9660 (MISCELLANEOUS) IMPLANT
CANISTER SUCTION 2500CC (MISCELLANEOUS) ×3 IMPLANT
CHLORAPREP W/TINT 26ML (MISCELLANEOUS) IMPLANT
COVER MAYO STAND STRL (DRAPES) ×6 IMPLANT
COVER SURGICAL LIGHT HANDLE (MISCELLANEOUS) ×3 IMPLANT
DRAIN PENROSE 1/4X12 LTX STRL (WOUND CARE) ×3 IMPLANT
DRAPE LAPAROSCOPIC ABDOMINAL (DRAPES) ×3 IMPLANT
DRAPE PROXIMA HALF (DRAPES) ×6 IMPLANT
DRAPE UTILITY XL STRL (DRAPES) ×3 IMPLANT
DRAPE WARM FLUID 44X44 (DRAPE) ×3 IMPLANT
DRSG OPSITE POSTOP 4X10 (GAUZE/BANDAGES/DRESSINGS) IMPLANT
DRSG OPSITE POSTOP 4X8 (GAUZE/BANDAGES/DRESSINGS) IMPLANT
ELECT BLADE 6.5 EXT (BLADE) ×3 IMPLANT
ELECT CAUTERY BLADE 6.4 (BLADE) ×6 IMPLANT
ELECT REM PT RETURN 9FT ADLT (ELECTROSURGICAL) ×3
ELECTRODE REM PT RTRN 9FT ADLT (ELECTROSURGICAL) ×2 IMPLANT
GLOVE BIOGEL PI IND STRL 6.5 (GLOVE) ×2 IMPLANT
GLOVE BIOGEL PI IND STRL 8 (GLOVE) ×4 IMPLANT
GLOVE BIOGEL PI INDICATOR 6.5 (GLOVE) ×1
GLOVE BIOGEL PI INDICATOR 8 (GLOVE) ×2
GLOVE ECLIPSE 7.5 STRL STRAW (GLOVE) ×9 IMPLANT
GLOVE SURG SS PI 6.5 STRL IVOR (GLOVE) ×3 IMPLANT
GOWN STRL REUS W/ TWL LRG LVL3 (GOWN DISPOSABLE) ×14 IMPLANT
GOWN STRL REUS W/TWL LRG LVL3 (GOWN DISPOSABLE) ×7
KIT BASIN OR (CUSTOM PROCEDURE TRAY) ×3 IMPLANT
KIT ROOM TURNOVER OR (KITS) ×3 IMPLANT
LEGGING LITHOTOMY PAIR STRL (DRAPES) IMPLANT
LIGASURE IMPACT 36 18CM CVD LR (INSTRUMENTS) IMPLANT
NS IRRIG 1000ML POUR BTL (IV SOLUTION) ×6 IMPLANT
PACK GENERAL/GYN (CUSTOM PROCEDURE TRAY) ×3 IMPLANT
PAD ARMBOARD 7.5X6 YLW CONV (MISCELLANEOUS) ×3 IMPLANT
PENCIL BUTTON HOLSTER BLD 10FT (ELECTRODE) ×3 IMPLANT
SPECIMEN JAR MEDIUM (MISCELLANEOUS) IMPLANT
SPONGE GAUZE 4X4 12PLY STER LF (GAUZE/BANDAGES/DRESSINGS) ×3 IMPLANT
SPONGE LAP 18X18 X RAY DECT (DISPOSABLE) IMPLANT
STAPLER VISISTAT 35W (STAPLE) ×3 IMPLANT
SUCTION POOLE TIP (SUCTIONS) ×3 IMPLANT
SURESTEP FOLEY TRAY SYSTEM 16FR NRL IMPLANT
SURGILUBE 2OZ TUBE FLIPTOP (MISCELLANEOUS) ×3 IMPLANT
SUT ETHILON 2 0 FS 18 (SUTURE) ×3 IMPLANT
SUT NOVA NAB DX-16 0-1 5-0 T12 (SUTURE) ×6 IMPLANT
SUT PDS AB 1 TP1 96 (SUTURE) ×6 IMPLANT
SUT SILK 2 0 SH (SUTURE) IMPLANT
SUT SILK 2 0 SH CR/8 (SUTURE) ×3 IMPLANT
SUT SILK 2 0 TIES 10X30 (SUTURE) ×3 IMPLANT
SUT SILK 3 0 SH CR/8 (SUTURE) ×6 IMPLANT
SUT SILK 3 0 TIES 10X30 (SUTURE) ×3 IMPLANT
SUT VIC AB 3-0 SH 27 (SUTURE) ×1
SUT VIC AB 3-0 SH 27X BRD (SUTURE) ×2 IMPLANT
SYR BULB IRRIGATION 50ML (SYRINGE) ×3 IMPLANT
TAPE CLOTH SURG 6X10 WHT LF (GAUZE/BANDAGES/DRESSINGS) ×3 IMPLANT
TOWEL OR 17X24 6PK STRL BLUE (TOWEL DISPOSABLE) ×3 IMPLANT
TOWEL OR 17X26 10 PK STRL BLUE (TOWEL DISPOSABLE) ×3 IMPLANT
TRAY FOLEY BAG SILVER LF 16FR (SET/KITS/TRAYS/PACK) ×3 IMPLANT
TRAY FOLEY CATH 14FRSI W/METER (CATHETERS) IMPLANT
TRAY PROCTOSCOPIC FIBER OPTIC (SET/KITS/TRAYS/PACK) ×3 IMPLANT
TUBE CONNECTING 12X1/4 (SUCTIONS) ×6 IMPLANT
YANKAUER SUCT BULB TIP NO VENT (SUCTIONS) ×3 IMPLANT

## 2014-09-26 NOTE — Progress Notes (Signed)
Patient reports having an enema at home last night and this am. Ordered enema held d/t same. Cardiology records not received.

## 2014-09-26 NOTE — Progress Notes (Signed)
Patient arrived to floor from PACU. Report received from Clovia Cuff RN

## 2014-09-26 NOTE — Care Management Note (Signed)
Case Management Note  Patient Details  Name: Yvonne Espinoza MRN: [id number] Date of Birth: [birth year]  Subjective/Objective:                    Action/Plan: UR completed   Expected Discharge Date:       09-29-14            Expected Discharge Plan:  Home w Home Health Services  In-House Referral:     Discharge planning Services     Post Acute Care Choice:    Choice offered to:     DME Arranged:    DME Agency:     HH Arranged:    HH Agency:     Status of Service:  In process, will continue to follow  Medicare Important Message Given:  N/A - LOS <3 / Initial given by admissions Date Medicare IM Given:    Medicare IM give by:    Date Additional Medicare IM Given:    Additional Medicare Important Message give by:     If discussed at Long Length of Stay Meetings, dates discussed:    Additional Comments:  Kingsley Plan, RN 09/26/2014, 2:45 PM

## 2014-09-26 NOTE — Op Note (Signed)
OPERATIVE REPORT  DATE OF OPERATION: 09/26/2014  PATIENT:  Yvonne Espinoza  74 y.o. female  PRE-OPERATIVE DIAGNOSIS:  ILEOSTOMY  POST-OPERATIVE DIAGNOSIS:  ILEOSTOMY  PROCEDURE:  Procedure(s): LOOP COLOSTOMY REVERESAL RIGID SIGMOIDOSCOPY  SURGEON:  Surgeon(s): Jimmye Norman, MD  ASSISTANT: None  ANESTHESIA:   general  EBL: <50 ml  BLOOD ADMINISTERED: none  DRAINS: Penrose drain in the ileostomy site and Urinary Catheter (Foley)   SPECIMEN:  No Specimen  COUNTS CORRECT:  YES  PROCEDURE DETAILS: The patient was taken to the operating room and placed initially on the table in supine position. After an adequate general endotracheal anesthesia was administered she was placed in lithotomy and a rigid sigmoidoscopy performed after proper timeout.  A proper timeout was performed identifying the patient and procedure to be performed. A rigid sigmoidoscope was inserted through the anus into the distal rectum. At approximately 10 cm the anastomosis was encountered where there was some granulation tissue but no evidence of breakdown of the anastomosis. Air insufflation held very well with no evidence of leakage.  We retrieved the scope and then placed the patient demonstrates supine position and prepped her abdomen with Betadine in usual sterile manner. Prior to doing so the loop ileostomy was closed with interrupted 2-0 silk sutures.  A second timeout was performed. We made an incision around the ileostomy on the right side using a #10 blade after the area had been marked with a marking pen. We dissected out the loop ileostomy from the subcutaneous surrounding his down through the subcutaneous and fascial attachments freed up circumferentially. We then cut out the ends of the proximal and distal limbs and performed an end-to-end single layer with 3-0 silk anastomosis circumferentially. Once this was done we were able to pass the anastomosis back through the fascial opening into the  peritoneal cavity well the surgeon's finger was used to make sure there were no attachments to the anterior abdominal wall.  The fascia was then closed using interrupted #1 Novafil sutures. We irrigated subcutaneous tissue with saline solution then closed it using stainless steel staples. A Penrose drain was brought out the right lateral side of the incision of the size of a quarter inch.  All needle counts sponge counts and instrument counts were correct.  PATIENT DISPOSITION:  PACU - hemodynamically stable.   Micharl Helmes 5/24/20169:11 AM

## 2014-09-26 NOTE — Anesthesia Preprocedure Evaluation (Signed)
Anesthesia Evaluation  Patient identified by MRN, date of birth, ID band Patient awake    Reviewed: Allergy & Precautions, NPO status , Patient's Chart, lab work & pertinent test results, Unable to perform ROS - Chart review only  Airway Mallampati: II  TM Distance: >3 FB Neck ROM: Full    Dental  (+) Dental Advisory Given, Edentulous Upper   Pulmonary  breath sounds clear to auscultation        Cardiovascular Rhythm:Regular Rate:Normal     Neuro/Psych    GI/Hepatic   Endo/Other    Renal/GU      Musculoskeletal   Abdominal   Peds  Hematology   Anesthesia Other Findings   Reproductive/Obstetrics                             Anesthesia Physical Anesthesia Plan  ASA: III  Anesthesia Plan: General   Post-op Pain Management:    Induction: Intravenous  Airway Management Planned: Oral ETT  Additional Equipment:   Intra-op Plan:   Post-operative Plan:   Informed Consent: I have reviewed the patients History and Physical, chart, labs and discussed the procedure including the risks, benefits and alternatives for the proposed anesthesia with the patient or authorized representative who has indicated his/her understanding and acceptance.   Dental advisory given  Plan Discussed with: CRNA and Anesthesiologist  Anesthesia Plan Comments: (H/O diverticulitis with abscess S/P colectomy with colostomy H/O TIA  Kipp Brood)        Anesthesia Quick Evaluation

## 2014-09-26 NOTE — Anesthesia Procedure Notes (Signed)
Procedure Name: Intubation Date/Time: 09/26/2014 7:38 AM Performed by: Kyung Rudd Pre-anesthesia Checklist: Patient identified, Emergency Drugs available, Suction available, Patient being monitored and Timeout performed Patient Re-evaluated:Patient Re-evaluated prior to inductionOxygen Delivery Method: Circle system utilized Preoxygenation: Pre-oxygenation with 100% oxygen Intubation Type: IV induction Ventilation: Mask ventilation without difficulty Laryngoscope Size: Mac and 3 Grade View: Grade I Tube type: Oral Tube size: 7.0 mm Number of attempts: 1 Airway Equipment and Method: Stylet and LTA kit utilized Placement Confirmation: ETT inserted through vocal cords under direct vision,  positive ETCO2 and breath sounds checked- equal and bilateral Secured at: 21 cm Tube secured with: Tape Dental Injury: Teeth and Oropharynx as per pre-operative assessment

## 2014-09-26 NOTE — Anesthesia Postprocedure Evaluation (Signed)
  Anesthesia Post-op Note  Patient: Yvonne Espinoza  Procedure(s) Performed: Procedure(s): LOOP COLOSTOMY REVERESAL (N/A) RIGID SIGMOIDOSCOPY (N/A)  Patient Location: PACU  Anesthesia Type:General  Level of Consciousness: awake, alert , oriented and patient cooperative  Airway and Oxygen Therapy: Patient Spontanous Breathing and Patient connected to nasal cannula oxygen  Post-op Pain: mild  Post-op Assessment: Post-op Vital signs reviewed, Patient's Cardiovascular Status Stable, Respiratory Function Stable, Patent Airway, No signs of Nausea or vomiting and Pain level controlled  Post-op Vital Signs: Reviewed and stable  Last Vitals:  Filed Vitals:   09/26/14 1244  BP: 130/57  Pulse: 67  Temp: 36.6 C  Resp: 16    Complications: No apparent anesthesia complications

## 2014-09-26 NOTE — Transfer of Care (Signed)
Immediate Anesthesia Transfer of Care Note  Patient: Yvonne Espinoza  Procedure(s) Performed: Procedure(s): LOOP COLOSTOMY REVERESAL (N/A) RIGID SIGMOIDOSCOPY (N/A)  Patient Location: PACU  Anesthesia Type:General  Level of Consciousness: awake, alert  and oriented  Airway & Oxygen Therapy: Patient Spontanous Breathing and Patient connected to nasal cannula oxygen  Post-op Assessment: Report given to RN, Post -op Vital signs reviewed and stable and Patient moving all extremities  Post vital signs: Reviewed and stable  Last Vitals:  Filed Vitals:   09/26/14 0619  BP: 122/50  Pulse:   Temp:   Resp:     Complications: No apparent anesthesia complications

## 2014-09-26 NOTE — H&P (Signed)
  Yvonne Espinoza 07/18/2014 10:43 AM Location: [facility] Patient #: [id number] DOB: [birth year] Married / Language: [place] / Race: White Female  History of Present Illness Michel Bickers LPN; 05/23/4172 08:14 AM) Patient words: post op.  The patient is a 74 year old female    Allergies Michel Bickers, LPN; 4/81/8563 14:97 AM) Neurontin *ANTICONVULSANTS* Neosporin AF *DERMATOLOGICALS* PredniSONE (Pak) *CORTICOSTEROIDS*  Medication History Michel Bickers, LPN; 0/26/3785 88:50 AM) Oxybutynin Chloride (5MG  Tablet, Oral as needed) Active. Folic Acid (1MG  Tablet, Oral) Active. Omeprazole (20MG  Capsule DR, Oral) Active. Biotin ( Tablet, Oral) Active. Co Q 10 (100MG  Capsule, Oral) Active. Aspirin EC (325MG  Tablet DR, Oral) Active. Multivitamin Adults 50+ (Oral) Active. Medications Reconciled  [facility] LPN;  AM) 07/18/2014 10:45 AM Weight: 103 lb Height: 60in Body Surface Area: 1.41 m Body Mass Index: 20.12 kg/m Temp.: 97.11F  Pulse: 80 (Regular)  BP: 112/78 (Sitting, Left Arm, Standard)    Physical Exam (Hanley Rispoli O. Jniya Madara MD; 07/18/2014 11:14 AM) Abdomen Inspection Inspection of the abdomen reveals - No Visible peristalsis and No Hernias. Note: Loop ileostomy leaking laterally. Reapplied ostomy device with preformed stomal button underneath appliance. Two piece device applied. The skin around the ostomy was somewhat excoriated, butnot awful. Lungs: Clear to auscultation Cardiac:  No murmurs or abnormal sounds.     Assessment & Plan [facility] O. Tenessa Marsee MD; 07/18/2014 11:16 AM) ILEOSTOMY CARE (V55.2  Z43.2) ILEOSTOMY IN PLACE (V44.2  Z93.2) POSTOP CHECK (V67.00  Z09) Impression: Patient here for first postoperative visit. Ileostomy is leaking on the right as pecto of device. Repplied with stomal button. Patient will let 78:67 know if it holds well. Will see back in one month for re-evaluation for reversal.  referral made to 07/20/2014 at [facility] stomal advice. Current Plans  Follow up in 1 month or as needed  Preoperative contrast study demonstrates no leak. Will perform rigid sigmoidoscopy prior to reversal of ileostomy.  No Entereg.

## 2014-09-26 NOTE — Progress Notes (Signed)
rec'd report from Janeann Forehand, assuming care of patient at this time

## 2014-09-27 ENCOUNTER — Encounter (HOSPITAL_COMMUNITY): Payer: Self-pay | Admitting: General Surgery

## 2014-09-27 LAB — CBC
HCT: 39.6 % (ref 36.0–46.0)
HEMOGLOBIN: 12.8 g/dL (ref 12.0–15.0)
MCH: 28.3 pg (ref 26.0–34.0)
MCHC: 32.3 g/dL (ref 30.0–36.0)
MCV: 87.6 fL (ref 78.0–100.0)
Platelets: 286 10*3/uL (ref 150–400)
RBC: 4.52 MIL/uL (ref 3.87–5.11)
RDW: 15.3 % (ref 11.5–15.5)
WBC: 11.1 10*3/uL — ABNORMAL HIGH (ref 4.0–10.5)

## 2014-09-27 LAB — BASIC METABOLIC PANEL
Anion gap: 9 (ref 5–15)
CALCIUM: 8.7 mg/dL — AB (ref 8.9–10.3)
CHLORIDE: 100 mmol/L — AB (ref 101–111)
CO2: 26 mmol/L (ref 22–32)
CREATININE: 0.73 mg/dL (ref 0.44–1.00)
GFR calc non Af Amer: >60 mL/min (ref >60)
Glucose, Bld: 136 mg/dL — ABNORMAL HIGH (ref 65–99)
POTASSIUM: 4.3 mmol/L (ref 3.5–5.1)
Sodium: 135 mmol/L (ref 135–145)

## 2014-09-27 MED ORDER — ALUM & MAG HYDROXIDE-SIMETH 200-200-20 MG/5ML PO SUSP
30.0000 mL | ORAL | Status: DC | PRN
  Administered 2014-09-28: 30 mL via ORAL
  Filled 2014-09-27 (×2): qty 30

## 2014-09-27 MED ORDER — ACETAMINOPHEN 325 MG PO TABS
650.0000 mg | ORAL_TABLET | Freq: Four times a day (QID) | ORAL | Status: DC | PRN
  Administered 2014-09-27 – 2014-09-28 (×3): 650 mg via ORAL
  Filled 2014-09-27 (×3): qty 2

## 2014-09-27 NOTE — Progress Notes (Signed)
GS Progress Note Subjective: Patient doing better t his AM than yesterday afternoon.  Less nausea, but slightly more pain.  No acute distress  Objective: Vital signs in last 24 hours: Temp:  [97.3 F (36.3 C)-99.5 F (37.5 C)] 99.5 F (37.5 C) (05/25 0602) Pulse Rate:  [52-107] 89 (05/25 0602) Resp:  [11-21] 18 (05/25 0602) BP: (112-147)/(37-65) 112/42 mmHg (05/25 0602) SpO2:  [99 %-100 %] 99 % (05/25 0602) Weight:  [53.524 kg (118 lb)] 53.524 kg (118 lb) (05/24 1244) Last BM Date: 09/26/14 (prior to surgery)  Intake/Output from previous day: 05/24 0701 - 05/25 0700 In: 2715 [P.O.:275; I.V.:2440] Out: 1850 [Urine:1850] Intake/Output this shift:    Lungs: No acute distress.  Foley just removed, has not voided yet.  Abd: Soft, mildly distended, good bowel sounds.  Extremities: No clinical signs or symptoms of DVT  Neuro: Intact  Lab Results: CBC   Recent Labs  09/27/14 0517  WBC 11.1*  HGB 12.8  HCT 39.6  PLT 286   BMET  Recent Labs  09/27/14 0517  NA 135  K 4.3  CL 100*  CO2 26  GLUCOSE 136*  BUN <5*  CREATININE 0.73  CALCIUM 8.7*   PT/INR No results for input(s): LABPROT, INR in the last 72 hours. ABG No results for input(s): PHART, HCO3 in the last 72 hours.  Invalid input(s): PCO2, PO2  Studies/Results: No results found.  Anti-infectives: Anti-infectives    Start     Dose/Rate Route Frequency Ordered Stop   09/26/14 1400  cefOXitin (MEFOXIN) 1 g in dextrose 5 % 50 mL IVPB     1 g 100 mL/hr over 30 Minutes Intravenous 4 times per day 09/26/14 1248 09/26/14 1606   09/26/14 0715  cefOXitin (MEFOXIN) 2 g in dextrose 5 % 50 mL IVPB     2 g 100 mL/hr over 30 Minutes Intravenous To ShortStay Surgical 09/25/14 1203 09/26/14 0755      Assessment/Plan: s/p Procedure(s): LOOP COLOSTOMY REVERESAL RIGID SIGMOIDOSCOPY Advance diet  Will advance to full liquid diet. Decrease IVFs slightly.  LOS: 1 day    Marta Lamas. Gae Bon, MD,  FACS [phone] [facility] 09/27/2014

## 2014-09-28 LAB — BASIC METABOLIC PANEL
Anion gap: 8 (ref 5–15)
BUN: <5 mg/dL — ABNORMAL LOW (ref 6–20)
CO2: 26 mmol/L (ref 22–32)
Calcium: 8.6 mg/dL — ABNORMAL LOW (ref 8.9–10.3)
Chloride: 101 mmol/L (ref 101–111)
Creatinine, Ser: 0.61 mg/dL (ref 0.44–1.00)
GFR calc Af Amer: >60 mL/min (ref >60)
GFR calc non Af Amer: >60 mL/min (ref >60)
Glucose, Bld: 170 mg/dL — ABNORMAL HIGH (ref 65–99)
Potassium: 4.5 mmol/L (ref 3.5–5.1)
Sodium: 135 mmol/L (ref 135–145)

## 2014-09-28 LAB — URINALYSIS, ROUTINE W REFLEX MICROSCOPIC
Bilirubin Urine: NEGATIVE
Glucose, UA: NEGATIVE mg/dL
Ketones, ur: NEGATIVE mg/dL
Leukocytes, UA: NEGATIVE
Nitrite: NEGATIVE
Protein, ur: NEGATIVE mg/dL
Specific Gravity, Urine: 1.005 (ref 1.005–1.030)
Urobilinogen, UA: 0.2 mg/dL (ref 0.0–1.0)
pH: 6.5 (ref 5.0–8.0)

## 2014-09-28 LAB — URINE MICROSCOPIC-ADD ON

## 2014-09-28 LAB — CBC
HCT: 39 % (ref 36.0–46.0)
HEMOGLOBIN: 12.8 g/dL (ref 12.0–15.0)
MCH: 28.6 pg (ref 26.0–34.0)
MCHC: 32.8 g/dL (ref 30.0–36.0)
MCV: 87.2 fL (ref 78.0–100.0)
PLATELETS: 274 10*3/uL (ref 150–400)
RBC: 4.47 MIL/uL (ref 3.87–5.11)
RDW: 15.1 % (ref 11.5–15.5)
WBC: 14.3 10*3/uL — AB (ref 4.0–10.5)

## 2014-09-28 NOTE — Progress Notes (Signed)
GS Progress Note Subjective: Patient was nauseated this AM, but better now.  Had small mucoid bowel movement.  No pelvic pain.  No pain with BM.  Objective: Vital signs in last 24 hours: Temp:  [98.7 F (37.1 C)-99.9 F (37.7 C)] 99.1 F (37.3 C) (05/26 0631) Pulse Rate:  [88-101] 101 (05/26 0631) Resp:  [16-18] 16 (05/26 0631) BP: (132-151)/(52-63) 151/59 mmHg (05/26 0631) SpO2:  [96 %-100 %] 96 % (05/26 0631) Last BM Date: 09/26/14  Intake/Output from previous day: 05/25 0701 - 05/26 0700 In: 1200 [P.O.:1200] Out: 3600 [Urine:3600] Intake/Output this shift: Total I/O In: -  Out: 150 [Urine:150]  Lungs: Clear  Abd: Excellent bowel sounds.  Wound is minimally red, Penrose from the right lateral aspect of the wound is draining dark fluid.  Extremities: No CVT signs or symptoms  Neuro: Intact  Lab Results: CBC   Recent Labs  09/27/14 0517 09/28/14 0326  WBC 11.1* 14.3*  HGB 12.8 12.8  HCT 39.6 39.0  PLT 286 274   BMET  Recent Labs  09/27/14 0517 09/28/14 0326  NA 135 135  K 4.3 4.5  CL 100* 101  CO2 26 26  GLUCOSE 136* 170*  BUN <5* <5*  CREATININE 0.73 0.61  CALCIUM 8.7* 8.6*   PT/INR No results for input(s): LABPROT, INR in the last 72 hours. ABG No results for input(s): PHART, HCO3 in the last 72 hours.  Invalid input(s): PCO2, PO2  Studies/Results: No results found.  Anti-infectives: Anti-infectives    Start     Dose/Rate Route Frequency Ordered Stop   09/26/14 1400  cefOXitin (MEFOXIN) 1 g in dextrose 5 % 50 mL IVPB     1 g 100 mL/hr over 30 Minutes Intravenous 4 times per day 09/26/14 1248 09/26/14 1606   09/26/14 0715  cefOXitin (MEFOXIN) 2 g in dextrose 5 % 50 mL IVPB     2 g 100 mL/hr over 30 Minutes Intravenous To ShortStay Surgical 09/25/14 1203 09/26/14 0755      Assessment/Plan: s/p Procedure(s): LOOP COLOSTOMY REVERESAL RIGID SIGMOIDOSCOPY Advance diet Increase WBC is a bit concerning with low grade fever up to 99.9.   Will recheck CBC tomorow.    LOS: 2 days    Marta Lamas. Gae Bon, MD, FACS [phone] [facility] 09/28/2014

## 2014-09-29 LAB — CBC WITH DIFFERENTIAL/PLATELET
BASOS ABS: 0 10*3/uL (ref 0.0–0.1)
BASOS PCT: 0 % (ref 0–1)
EOS ABS: 0.3 10*3/uL (ref 0.0–0.7)
Eosinophils Relative: 2 % (ref 0–5)
HEMATOCRIT: 36.6 % (ref 36.0–46.0)
HEMOGLOBIN: 12.1 g/dL (ref 12.0–15.0)
Lymphocytes Relative: 12 % (ref 12–46)
Lymphs Abs: 1.4 10*3/uL (ref 0.7–4.0)
MCH: 28.4 pg (ref 26.0–34.0)
MCHC: 33.1 g/dL (ref 30.0–36.0)
MCV: 85.9 fL (ref 78.0–100.0)
MONO ABS: 1.5 10*3/uL — AB (ref 0.1–1.0)
MONOS PCT: 12 % (ref 3–12)
Neutro Abs: 9 10*3/uL — ABNORMAL HIGH (ref 1.7–7.7)
Neutrophils Relative %: 74 % (ref 43–77)
Platelets: 284 10*3/uL (ref 150–400)
RBC: 4.26 MIL/uL (ref 3.87–5.11)
RDW: 15.1 % (ref 11.5–15.5)
WBC: 12.1 10*3/uL — AB (ref 4.0–10.5)

## 2014-09-29 MED ORDER — TRAMADOL HCL 50 MG PO TABS: 50.0000 mg | ORAL_TABLET | Freq: Four times a day (QID) | ORAL | Status: DC | PRN

## 2014-09-29 MED ORDER — SODIUM CHLORIDE 0.9 % IJ SOLN
3.0000 mL | INTRAMUSCULAR | Status: DC | PRN
Start: 2014-09-29 — End: 2014-09-30

## 2014-09-29 NOTE — Care Management Note (Signed)
Case Management Note  Patient Details  Name: Yvonne Espinoza MRN: [id number] Date of Birth: 02-08-1941  Subjective/Objective:                    Action/Plan:   Expected Discharge Date:       09-29-14           Expected Discharge Plan:  Home w Home Health Services  In-House Referral:     Discharge planning Services     Post Acute Care Choice:    Choice offered to:     DME Arranged:    DME Agency:     HH Arranged:    HH Agency:     Status of Service:  In process, will continue to follow  Medicare Important Message Given:  N/A - LOS <3 / Initial given by admissions Date Medicare IM Given:  09/29/14 Medicare IM give by:  Ronny Flurry RN BSN  Date Additional Medicare IM Given:    Additional Medicare Important Message give by:     If discussed at Long Length of Stay Meetings, dates discussed:    Additional Comments:  Kingsley Plan, RN 09/29/2014, 9:51 AM

## 2014-09-29 NOTE — Discharge Instructions (Signed)
Loop Ileostomy Reversal Care After Refer to this sheet in the next few weeks. These instructions provide you with information on caring for yourself after your procedure. Your caregiver may also give you more specific instructions. Your treatment has been planned according to current medical practices, but problems sometimes occur. Call your caregiver if you have any problems or questions after your procedure. HOME CARE INSTRUCTIONS  Rest.  Take pain medicine or other medicines as directed.  Gradually return to daily activities. Gradually increase your exercise each day. Walking is a good option.  Avoid strenuous activity, such as heavy lifting, for 6 weeks.  Follow your caregiver's instructions to protect any surgical cuts (incisions). Clean the area gently with warm water and soap every day as directed.  Resume a normal diet. You may not have much of an appetite for several weeks after surgery. Try eating small meals and choose foods that appeal to you.  You may need to limit foods temporarily that are difficult to digest, such as nuts, coconut, mushrooms, raw fruits, and raw vegetables.  Follow up with your caregiver as directed.  You may return to work when you feel able, depending upon your job duties.  Always discuss your medicines with your caregiver before taking them.  If you smoke, quit. Smoking slows the healing process. SEEK MEDICAL CARE IF:  You develop chills.  You feel nauseous.  You vomit.  You notice fluid drainage, bulging, redness, or pain around your incisions.  You have frequent diarrhea.  You experience shortness of breath.  You have other new symptoms.  You have questions or concerns. SEEK IMMEDIATE MEDICAL CARE IF:  You feel dizzy, lightheaded, or faint.  You measure pouch drainage of more than 1,500 ml per day. This amount of drainage can lead to dehydration.  Your abdominal pain does not go away or becomes severe.  You have a fever.  You  keep vomiting.  You have an irregular heartbeat or chest pain. MAKE SURE YOU:  Understand these instructions.  Will watch your condition.  Will get help right away if you are not doing well or get worse. Document Released: 04/10/2011 Document Revised: 08/16/2012 Document Reviewed: 04/10/2011 [facility] Patient Information 2015 [place], [place]. This information is not intended to replace advice given to you by your health care provider. Make sure you discuss any questions you have with your health care provider.

## 2014-09-29 NOTE — Discharge Summary (Signed)
Physician Discharge Summary  Patient ID: Preslea Rhodus Sabourin MRN: [id number] DOB/AGE: 73-Oct-1942 73 y.o.  Admit date: 09/26/2014 Discharge date: 09/29/2014  Admission Diagnoses:  Discharge Diagnoses:  Active Problems:   Status post ileostomy   Discharged Condition: good  Hospital Course: Admitted postoperatively after loop ileostomy reversal.  Penrose left in ostomy site, removed the day prior to discharge.  Had some nasuea early after surgery which had improved significantly prior to discharge.  Wound look fine.  Fevers have resolved, but they were only low grade.  Consults: None  Significant Diagnostic Studies: labs: cbc/bmet  Treatments: IV hydration and surgery: Loop ileostomy reversal  Discharge Exam: Blood pressure 143/64, pulse 96, temperature 99.2 F (37.3 C), temperature source Oral, resp. rate 16, height 5' (1.524 m), weight 53.524 kg (118 lb), SpO2 96 %. General appearance: alert, cooperative, appears stated age and no distress Resp: clear to auscultation bilaterally GI: soft, non-tender; bowel sounds normal; no masses,  no organomegaly and Ileostoy site slightly erythematous, but Penrose pulled and drainage not infected. Incision/Wound: Clean and dry,  Penrose drain removed from the lateral aspect.  Disposition: 06-Home-Health Care Svc     Medication List    STOP taking these medications        HYDROcodone-acetaminophen 5-325 MG per tablet  Commonly known as:  NORCO/VICODIN      TAKE these medications        aspirin EC 81 MG tablet  Take 81 mg by mouth daily.     BIOTIN PO  Take 10,000 mg by mouth daily.     folic acid 1 MG tablet  Commonly known as:  FOLVITE  Take 1 mg by mouth 3 (three) times daily.     Garlic 1000 MG Caps  Take 1,000 mg by mouth daily.     naproxen sodium 220 MG tablet  Commonly known as:  ANAPROX  Take 440 mg by mouth as needed (pain).     ondansetron 4 MG tablet  Commonly known as:  ZOFRAN  Take 1 tablet (4 mg total) by  mouth every 6 (six) hours as needed for nausea.     oxybutynin 5 MG tablet  Commonly known as:  DITROPAN  Take 1 tablet (5 mg total) by mouth every 8 (eight) hours as needed for bladder spasms.     traMADol 50 MG tablet  Commonly known as:  ULTRAM  Take 1-2 tablets (50-100 mg total) by mouth every 6 (six) hours as needed for moderate pain.     WOMENS MULTIVITAMIN PLUS PO  Take 1 tablet by mouth daily.           Follow-up Information    Follow up with Jimmye Norman, MD In 10 days.   Specialty:  General Surgery   Why:  For wound re-check   Contact information:   [address] [place] [address] [phone]       Signed: Jimmye Norman 09/29/2014, 1:33 PM

## 2014-09-29 NOTE — Progress Notes (Signed)
Patient ID: Yvonne Espinoza, female   DOB: 09/08/1940, 74 y.o.   MRN: [id number] 3 Days Post-Op  Subjective: Pt still has nausea this morning.  Passing some flatus and stool, but feels like she is about the throw up.  Despite, this she still wants to go home.  Objective: Vital signs in last 24 hours: Temp:  [98.6 F (37 C)-99.2 F (37.3 C)] 99.2 F (37.3 C) (05/27 0616) Pulse Rate:  [88-109] 96 (05/27 0616) Resp:  [16] 16 (05/27 0616) BP: (139-143)/(57-64) 143/64 mmHg (05/27 0616) SpO2:  [96 %] 96 % (05/27 0616) Last BM Date: 09/28/14  Intake/Output from previous day: 05/26 0701 - 05/27 0700 In: 960 [P.O.:960] Out: 2175 [Urine:2175] Intake/Output this shift:    PE: Abd: soft, but distended, hypoactive BS, incision is c/d/i with staples Heart: regular  Lab Results:   Recent Labs  09/28/14 0326 09/29/14 0337  WBC 14.3* 12.1*  HGB 12.8 12.1  HCT 39.0 36.6  PLT 274 284   BMET  Recent Labs  09/27/14 0517 09/28/14 0326  NA 135 135  K 4.3 4.5  CL 100* 101  CO2 26 26  GLUCOSE 136* 170*  BUN <5* <5*  CREATININE 0.73 0.61  CALCIUM 8.7* 8.6*   PT/INR No results for input(s): LABPROT, INR in the last 72 hours. CMP     Component Value Date/Time   NA 135 09/28/2014 0326   K 4.5 09/28/2014 0326   CL 101 09/28/2014 0326   CO2 26 09/28/2014 0326   GLUCOSE 170* 09/28/2014 0326   BUN <5* 09/28/2014 0326   CREATININE 0.61 09/28/2014 0326   CALCIUM 8.6* 09/28/2014 0326   PROT 7.1 09/22/2014 1452   ALBUMIN 3.6 09/22/2014 1452   AST 23 09/22/2014 1452   ALT 17 09/22/2014 1452   ALKPHOS 82 09/22/2014 1452   BILITOT 0.3 09/22/2014 1452   GFRNONAA >60 09/28/2014 0326   GFRAA >60 09/28/2014 0326   Lipase     Component Value Date/Time   LIPASE 22 06/22/2014 1709       Studies/Results: No results found.  Anti-infectives: Anti-infectives    Start     Dose/Rate Route Frequency Ordered Stop   09/26/14 1400  cefOXitin (MEFOXIN) 1 g in dextrose 5 % 50 mL IVPB      1 g 100 mL/hr over 30 Minutes Intravenous 4 times per day 09/26/14 1248 09/26/14 1606   09/26/14 0715  cefOXitin (MEFOXIN) 2 g in dextrose 5 % 50 mL IVPB     2 g 100 mL/hr over 30 Minutes Intravenous To ShortStay Surgical 09/25/14 1203 09/26/14 0755       Assessment/Plan  s/p Procedure(s): LOOP COLOSTOMY REVERESAL RIGID SIGMOIDOSCOPY On soft diet, but still distended and nauseated.  I told her I did not think it was a good idea to go home today, but maybe tomorrow if she was better, but I would leave that up to her and Dr. Lindie Spruce -her WBC is down to 12 today from 14. -cont to mobilize and pulm toilet  LOS: 3 days    Yvonne Espinoza 09/29/2014, 9:36 AM Pager: [phone]

## 2014-09-30 NOTE — Progress Notes (Signed)
4 Days Post-Op  Subjective: Had several bowel movements and feels much better. Nausea has resolved. She hates the hospital food, otherwise. She wants to go home.  Objective: Vital signs in last 24 hours: Temp:  [98.5 F (36.9 C)-98.6 F (37 C)] 98.5 F (36.9 C) (05/27 2115) Pulse Rate:  [102-103] 102 (05/27 2115) Resp:  [16-17] 17 (05/27 2115) BP: (147-153)/(54-74) 153/74 mmHg (05/27 2115) SpO2:  [97 %] 97 % (05/27 2115) Last BM Date: 09/29/14  Intake/Output from previous day: 05/27 0701 - 05/28 0700 In: 720 [P.O.:720] Out: 250 [Urine:250] Intake/Output this shift: Total I/O In: -  Out: 150 [Urine:150]  General appearance: Alert. Cooperative. No distress. Mental status normal. Looks good. GI: Soft. Minimal incisional tenderness. Nondistended. Dressing dry.  Lab Results:  No results found for this or any previous visit (from the past 24 hour(s)).   Studies/Results: No results found.  . enoxaparin (LOVENOX) injection  40 mg Subcutaneous Q24H  . folic acid  1 mg Oral TID     Assessment/Plan: s/p Procedure(s): LOOP COLOSTOMY REVERESAL RIGID SIGMOIDOSCOPY  POD #4. Closure loop ileostomy. Ileus now resolved. She has made good progress. She meets discharge criteria Discharge home today. She has an appointment see Dr. Lindie Spruce in a week. She has an appointment for ultram. Diet and activities discussed.  @PROBHOSP @  LOS: 4 days    Yvonne Espinoza M 09/30/2014

## 2014-09-30 NOTE — Progress Notes (Addendum)
Discharge instructions gone over with patient. Home medications gone over. Prescription given. Diet, activity, signs and symptoms of infection, and incisional care gone over. My chart discussed. Follow up appointment is to be made. Patient verbalized understanding of instructions.

## 2015-04-20 ENCOUNTER — Encounter: Payer: Self-pay | Admitting: Family Medicine

## 2015-04-20 ENCOUNTER — Telehealth: Payer: Self-pay | Admitting: Family Medicine

## 2015-04-20 ENCOUNTER — Ambulatory Visit
Admission: RE | Admit: 2015-04-20 | Discharge: 2015-04-20 | Disposition: A | Discharge status: Home / Self Care | Payer: PPO | Source: Ambulatory Visit | Attending: Family Medicine | Admitting: Family Medicine

## 2015-04-20 ENCOUNTER — Ambulatory Visit (INDEPENDENT_AMBULATORY_CARE_PROVIDER_SITE_OTHER): Payer: PPO | Admitting: Family Medicine

## 2015-04-20 VITALS — BP 119/68 | HR 70 | Temp 97.4°F | Ht 60.0 in | Wt 118.0 lb

## 2015-04-20 DIAGNOSIS — R1030 Lower abdominal pain, unspecified: Secondary | ICD-10-CM

## 2015-04-20 DIAGNOSIS — Z8719 Personal history of other diseases of the digestive system: Secondary | ICD-10-CM | POA: Diagnosis not present

## 2015-04-20 DIAGNOSIS — E785 Hyperlipidemia, unspecified: Secondary | ICD-10-CM

## 2015-04-20 DIAGNOSIS — Z5181 Encounter for therapeutic drug level monitoring: Secondary | ICD-10-CM | POA: Diagnosis not present

## 2015-04-20 DIAGNOSIS — K219 Gastro-esophageal reflux disease without esophagitis: Secondary | ICD-10-CM

## 2015-04-20 DIAGNOSIS — D72829 Elevated white blood cell count, unspecified: Secondary | ICD-10-CM | POA: Diagnosis not present

## 2015-04-20 DIAGNOSIS — Z8673 Personal history of transient ischemic attack (TIA), and cerebral infarction without residual deficits: Secondary | ICD-10-CM

## 2015-04-20 LAB — UA/M W/RFLX CULTURE, ROUTINE
Bilirubin, UA: NEGATIVE
Glucose, UA: NEGATIVE
KETONES UA: NEGATIVE
Leukocytes, UA: NEGATIVE
Nitrite, UA: NEGATIVE
Protein, UA: NEGATIVE
RBC, UA: NEGATIVE
Specific Gravity, UA: 1.02 (ref 1.005–1.030)
Urobilinogen, Ur: 0.2 mg/dL (ref 0.2–1.0)
pH, UA: 5 (ref 5.0–7.5)

## 2015-04-20 LAB — CBC WITH DIFFERENTIAL/PLATELET
HEMOGLOBIN: 14.2 g/dL (ref 11.1–15.9)
Hematocrit: 42.6 % (ref 34.0–46.6)
LYMPHS ABS: 1.9 10*3/uL (ref 0.7–3.1)
LYMPHS: 27 %
MCH: 30 pg (ref 26.6–33.0)
MCHC: 33.3 g/dL (ref 31.5–35.7)
MCV: 90 fL (ref 79–97)
MID (Absolute): 0.6 10*3/uL (ref 0.1–1.6)
MID: 9 %
NEUTROS ABS: 4.6 10*3/uL (ref 1.4–7.0)
Neutrophils: 65 %
PLATELETS: 336 10*3/uL (ref 150–379)
RBC: 4.74 x10E6/uL (ref 3.77–5.28)
RDW: 16.3 % — ABNORMAL HIGH (ref 12.3–15.4)
WBC: 7.1 10*3/uL (ref 3.4–10.8)

## 2015-04-20 NOTE — Progress Notes (Signed)
BP 119/68 mmHg  Pulse 70  Temp(Src) 97.4 F (36.3 C)  Ht 5' (1.524 m)  Wt 118 lb (53.524 kg)  BMI 23.05 kg/m2  SpO2 99%   Subjective:    Patient ID: Yvonne Espinoza, female    DOB: Sep 27, 1940, 74 y.o.   MRN: [id number]  HPI: Yvonne Espinoza is a 74 y.o. female  Chief Complaint  Patient presents with  . Establish Care  Patient is here to establish care She just hasn't felt good and she isn't sure if she had flu or cold, having abdominal pain where a previous surgical incision was Since she is completely new to me, we had to back up to what the incision was In 2014, she got a bacterial infection, [place] the beach; in total pain through her body; she went to see Dr. Clovis Riley in [place], but "he's a pill pusher" she says; she had diverticulitis, then later had an 8 hour surgery; she had an ileostomy and then had reversal in May  Hospital notes briefly reviewed; she was admitted Jun 22, 2014 and discharged July 05, 2014; had sigmoid diverticulitis with colo-vesicle fistula; left ureteral transection, Boari flap left ureteral reimplant loop ileostomy sigmoid colectomy and bladder repair; she received one unit of PRBCs (hemoglobin dropped to 7.7); ileus during stay  Discharge summary from Sep 26, 2014 reviewed (loop ileostomy reversal)  We got back to her recent abdominal pain; she did not call her urologist; she thought about calling Dr. Lindie Spruce (surgeon) to see if he could check her to see if she had adhesions; yesterday, her pain was a 10 out of 10; her husband wanted her to go to the ER but she wouldn't go; today, her pain is essentially gone right now; she got sick with dry heaves around midnight last night, then upchucked around 2 am, but no nausea or vomiting since She is having a pulling in her abdomen on the right side where the reversal was; that has been going on for several months, but not severe, and thought maybe just still healing or adhesions; they were in the  process of moving, one thing after another No problems with bowels or bladder then she says she is urinating okay; just had renal US at the urologist's office just last month; just follow-up from the surgery; everything looked fine; she has some cysts Dr. Marcine Matar is the urologist Dr. Frederik Schmidt was her surgeon CT scan from Dec 2015 showed moderate to severe sigmoid diverticulitis with microperforation; reviewed report with patient Dec 2015 labs showed WBC 12.0, HGB 11.2; HGB 7.7 per hospital note in February  Chol May 2015: total 219, TG 126, HDL 51, LDL 143 She says she cannot tolerate statin; cardiologist tried her on statin, Dr. Gwen Pounds She has had tightness in her chest, muscle spasms in her chest, like a pain that is always on her left side, just tightens up; checked out by cardiology already; had a stress test and Dr. Nadara Eaton (another cardiologist) checked her out; she had her carotids looked at a few years ago as well  Still has uterus, has a hx of fibroid tumors  Relevant past medical, surgical, family and social history reviewed and updated as indicated Past Medical History  Diagnosis Date  . TIA (transient ischemic attack)     2209,2010  . Spinal stenosis   . Diverticulitis   . Stroke [facility])     TIA 2009  . GERD (gastroesophageal reflux disease)   . Arthritis  RA  . Abscess     colon  . Kidney cysts   . Wears glasses   . TMJ (temporomandibular joint disorder) 5/14  patient says she has NOT had a stroke; not sure if TIA or Bell's palsy She went to hospital, they ran tests, eye doctor says she had a stroke (so there is obvious confusion) She takes 325 mg aspirin; "stroke" did not affect her eyes She saw neurologist, they put her on gabapentin for 3 years then had side effects, stopped it Cannot tolerate statins she says   Past Surgical History  Procedure Laterality Date  . Shoulder surgery      right 2010  . Knee arthroscopy Bilateral   . Ileostomy N/A  06/26/2014    Procedure: LOOP ILEOSTOMY;  Surgeon: Frederik Schmidt, MD;  Location: [facility] OR;  Service: General;  Laterality: N/A;  . Colostomy revision N/A 06/26/2014    Procedure: SIGMOID COLECTOMY WITH BLADDER REPAIR;  Surgeon: Frederik Schmidt, MD;  Location: [facility] OR;  Service: General;  Laterality: N/A;  . Cystoscopy w/ ureteral stent placement Left 06/26/2014    Procedure: BOARI FLAP LEFT URETERAL REIMPLANT;  Surgeon: Chelsea Aus, MD;  Location: [facility] OR;  Service: Urology;  Laterality: Left;  . Dilation and curettage of uterus    . Colostomy reversal  09/26/2014    dr wyatt  . Transverse loop colostomy N/A 09/26/2014    Procedure: LOOP COLOSTOMY REVERESAL;  Surgeon: Jimmye Norman, MD;  Location: [facility] OR;  Service: General;  Laterality: N/A;  . Flexible sigmoidoscopy N/A 09/26/2014    Procedure: RIGID SIGMOIDOSCOPY;  Surgeon: Jimmye Norman, MD;  Location: [facility] OR;  Service: General;  Laterality: N/A;  . Esophageal dilation  10/10/08  . Cardiovascular stress test  08/08/11 and 06/07/10   Allergies and medications reviewed and updated.  Review of Systems  Constitutional: Negative for fever and unexpected weight change.  Cardiovascular: Positive for chest pain (left side, saw cardiologist, had stress test). Negative for palpitations and leg swelling.  Gastrointestinal: Positive for nausea, vomiting and diarrhea (Monday). Negative for blood in stool.  Genitourinary: Negative for dysuria and hematuria.  Neurological: Negative for tremors.   Per HPI unless specifically indicated above     Objective:    BP 119/68 mmHg  Pulse 70  Temp(Src) 97.4 F (36.3 C)  Ht 5' (1.524 m)  Wt 118 lb (53.524 kg)  BMI 23.05 kg/m2  SpO2 99%  Wt Readings from Last 3 Encounters:  04/20/15 118 lb (53.524 kg)  09/26/14 118 lb (53.524 kg)  09/22/14 113 lb 14.4 oz (51.665 kg)    Physical Exam  Constitutional: She appears well-developed and well-nourished. No distress.  HENT:  Head: Normocephalic and atraumatic.  Eyes: EOM are  normal. No scleral icterus.  Neck: No thyromegaly present.  Cardiovascular: Normal rate, regular rhythm and normal heart sounds.   No murmur heard. Pulmonary/Chest: Effort normal and breath sounds normal. No respiratory distress. She has no wheezes.  Abdominal: Soft. Bowel sounds are normal. She exhibits no distension. There is no hepatosplenomegaly. There is no rigidity and no guarding.  Surgical scar abdomen, nontendern, no fluctuance, no guarding  Musculoskeletal: Normal range of motion. She exhibits no edema.  Neurological: She is alert. She exhibits normal muscle tone.  Skin: Skin is warm and dry. She is not diaphoretic. No pallor.  Psychiatric: She has a normal mood and affect. Her behavior is normal. Judgment and thought content normal.   Urinalysis    Component Value Date/Time   COLORURINE YELLOW  09/28/2014 2031   APPEARANCEUR CLEAR 09/28/2014 2031   LABSPEC 1.005 09/28/2014 2031   PHURINE 6.5 09/28/2014 2031   GLUCOSEU Negative 04/20/2015 0950   HGBUR TRACE* 09/28/2014 2031   BILIRUBINUR Negative 04/20/2015 0950   BILIRUBINUR NEGATIVE 09/28/2014 2031   KETONESUR NEGATIVE 09/28/2014 2031   PROTEINUR NEGATIVE 09/28/2014 2031   UROBILINOGEN 0.2 09/28/2014 2031   NITRITE Negative 04/20/2015 0950   NITRITE NEGATIVE 09/28/2014 2031   LEUKOCYTESUR Negative 04/20/2015 0950   LEUKOCYTESUR NEGATIVE 09/28/2014 2031  Urine done in-house; not sure why readings from today and May are admixed above     Assessment & Plan:   Problem List Items Addressed This Visit      Digestive   Esophageal reflux   Relevant Medications   Probiotic Product (VSL#3 PO)     Other   Hyperlipidemia (Chronic)    Patient reports intolerance to statins; will check lipid panel      Relevant Medications   aspirin EC 325 MG tablet   Hx-TIA (transient ischemic attack) (Chronic)    Patient on aspirin; does not tolerate statins; check lipids today; BP is well-controlled; she has seen neurologist and  reports having carotids evaluated; need to review outside records      Relevant Orders   Lipid Panel w/o Chol/HDL Ratio (Completed)   Hx of diverticulitis of colon    Reviewed hospital record, discharge summary note from her February to March hospitalization; if abdominal pain recurs or she develops fever, go to ER; CBC done in house today and WBC was normal without a left shift      Leukocytosis    Reviewed last WBC from Dec 2015; stopped MTX in January 2016; recheck today      Hx of completed stroke    Not sure if patient has TIA or stroke; will have to review outside records; I explained that if she had residual deficit persisting beyond 24 hours, that would be stroke; she does not tolerate statins per her report; she is on aspirin      Medication monitoring encounter    Check cbc and renal function on full dose aspirin      Lower abdominal pain - Primary    With history of surgery earlier this year; essentially no pain now, but 10 out of 10 last night; possibility of PSBO; will get urine, labs, AAS; reasons to go to ER reviewed with patient; adhesions certainly a possibility; she will contact her surgeon      Relevant Orders   DG Abd Acute W/Chest (Completed)   UA/M w/rflx Culture, Routine (Completed)   CBC With Differential/Platelet (Completed)   Comprehensive metabolic panel (Completed)      Follow up plan: Return in about 3 weeks (around 05/11/2015) for 30 minute visit, bring any records with you to review.  Orders Placed This Encounter  Procedures  . DG Abd Acute W/Chest  . UA/M w/rflx Culture, Routine  . CBC With Differential/Platelet  . Comprehensive metabolic panel  . Lipid Panel w/o Chol/HDL Ratio    An after-visit summary was printed and given to the patient at check-out.  Please see the patient instructions which may contain other information and recommendations beyond what is mentioned above in the assessment and plan.  Face-to-face time with patient was  more than 45 minutes, >50% time spent counseling and coordination of care

## 2015-04-20 NOTE — Assessment & Plan Note (Signed)
Reviewed last WBC from Dec 2015; stopped MTX in January 2016; recheck today

## 2015-04-20 NOTE — Telephone Encounter (Signed)
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

## 2015-04-20 NOTE — Patient Instructions (Addendum)
Try to limit saturated fats in your diet (bologna, hot dogs, barbeque, cheeseburgers, hamburgers, steak, bacon, sausage, cheese, etc.) and get more fresh fruits, vegetables, and whole grains We'll contact you about the xray and lab results If you have not heard anything from my staff in a week about any orders/referrals/studies from today, please contact us here to follow-up [phone] Please go from here to [facility] for the xrays If you get worse over the weekend or any time, then go to the emergency room Please do schedule an appointment with Dr. Lindie Spruce about your symptoms

## 2015-04-21 LAB — COMPREHENSIVE METABOLIC PANEL
A/G RATIO: 1.4 (ref 1.1–2.5)
ALBUMIN: 4 g/dL (ref 3.5–4.8)
ALT: 13 IU/L (ref 0–32)
AST: 20 IU/L (ref 0–40)
Alkaline Phosphatase: 94 IU/L (ref 39–117)
BILIRUBIN TOTAL: 0.4 mg/dL (ref 0.0–1.2)
BUN / CREAT RATIO: 17 (ref 11–26)
BUN: 14 mg/dL (ref 8–27)
CALCIUM: 9.9 mg/dL (ref 8.7–10.3)
CHLORIDE: 100 mmol/L (ref 96–106)
CO2: 26 mmol/L (ref 18–29)
Creatinine, Ser: 0.84 mg/dL (ref 0.57–1.00)
GFR calc Af Amer: 80 mL/min/{1.73_m2} (ref >59)
GFR, EST NON AFRICAN AMERICAN: 69 mL/min/{1.73_m2} (ref >59)
Globulin, Total: 2.8 g/dL (ref 1.5–4.5)
Glucose: 88 mg/dL (ref 65–99)
Potassium: 4.9 mmol/L (ref 3.5–5.2)
Sodium: 143 mmol/L (ref 134–144)
Total Protein: 6.8 g/dL (ref 6.0–8.5)

## 2015-04-21 LAB — LIPID PANEL W/O CHOL/HDL RATIO
Cholesterol, Total: 265 mg/dL — ABNORMAL HIGH (ref 100–199)
HDL: 65 mg/dL (ref >39)
LDL Calculated: 177 mg/dL — ABNORMAL HIGH (ref 0–99)
Triglycerides: 116 mg/dL (ref 0–149)
VLDL CHOLESTEROL CAL: 23 mg/dL (ref 5–40)

## 2015-04-22 ENCOUNTER — Encounter: Payer: Self-pay | Admitting: Family Medicine

## 2015-04-22 DIAGNOSIS — E785 Hyperlipidemia, unspecified: Secondary | ICD-10-CM | POA: Insufficient documentation

## 2015-04-22 DIAGNOSIS — Z5181 Encounter for therapeutic drug level monitoring: Secondary | ICD-10-CM

## 2015-04-22 DIAGNOSIS — R1030 Lower abdominal pain, unspecified: Secondary | ICD-10-CM | POA: Insufficient documentation

## 2015-04-22 DIAGNOSIS — Z8673 Personal history of transient ischemic attack (TIA), and cerebral infarction without residual deficits: Secondary | ICD-10-CM | POA: Insufficient documentation

## 2015-04-22 DIAGNOSIS — K219 Gastro-esophageal reflux disease without esophagitis: Secondary | ICD-10-CM | POA: Insufficient documentation

## 2015-04-22 NOTE — Assessment & Plan Note (Signed)
Patient reports intolerance to statins; will check lipid panel

## 2015-04-22 NOTE — Assessment & Plan Note (Signed)
With history of surgery earlier this year; essentially no pain now, but 10 out of 10 last night; possibility of PSBO; will get urine, labs, AAS; reasons to go to ER reviewed with patient; adhesions certainly a possibility; she will contact her surgeon

## 2015-04-22 NOTE — Assessment & Plan Note (Signed)
Reviewed hospital record, discharge summary note from her February to March hospitalization; if abdominal pain recurs or she develops fever, go to ER; CBC done in house today and WBC was normal without a left shift

## 2015-04-22 NOTE — Assessment & Plan Note (Signed)
Patient on aspirin; does not tolerate statins; check lipids today; BP is well-controlled; she has seen neurologist and reports having carotids evaluated; need to review outside records

## 2015-04-22 NOTE — Assessment & Plan Note (Signed)
Check cbc and renal function on full dose aspirin

## 2015-04-22 NOTE — Assessment & Plan Note (Signed)
Not sure if patient has TIA or stroke; will have to review outside records; I explained that if she had residual deficit persisting beyond 24 hours, that would be stroke; she does not tolerate statins per her report; she is on aspirin

## 2015-04-25 ENCOUNTER — Inpatient Hospital Stay (HOSPITAL_COMMUNITY): Payer: PPO

## 2015-04-25 ENCOUNTER — Inpatient Hospital Stay (HOSPITAL_COMMUNITY)
Admission: EM | Admit: 2015-04-25 | Discharge: 2015-04-27 | DRG: 389 | Disposition: A | Discharge status: Home / Self Care | Payer: PPO | Attending: Internal Medicine | Admitting: Internal Medicine

## 2015-04-25 ENCOUNTER — Emergency Department (HOSPITAL_COMMUNITY): Payer: PPO

## 2015-04-25 ENCOUNTER — Encounter (HOSPITAL_COMMUNITY): Payer: Self-pay | Admitting: Emergency Medicine

## 2015-04-25 DIAGNOSIS — K56600 Partial intestinal obstruction, unspecified as to cause: Secondary | ICD-10-CM | POA: Diagnosis present

## 2015-04-25 DIAGNOSIS — M1990 Unspecified osteoarthritis, unspecified site: Secondary | ICD-10-CM | POA: Diagnosis present

## 2015-04-25 DIAGNOSIS — Z7982 Long term (current) use of aspirin: Secondary | ICD-10-CM | POA: Diagnosis not present

## 2015-04-25 DIAGNOSIS — Z932 Ileostomy status: Secondary | ICD-10-CM

## 2015-04-25 DIAGNOSIS — Z66 Do not resuscitate: Secondary | ICD-10-CM | POA: Diagnosis present

## 2015-04-25 DIAGNOSIS — K56609 Unspecified intestinal obstruction, unspecified as to partial versus complete obstruction: Secondary | ICD-10-CM

## 2015-04-25 DIAGNOSIS — K219 Gastro-esophageal reflux disease without esophagitis: Secondary | ICD-10-CM | POA: Diagnosis present

## 2015-04-25 DIAGNOSIS — Z0189 Encounter for other specified special examinations: Secondary | ICD-10-CM

## 2015-04-25 DIAGNOSIS — K5669 Other intestinal obstruction: Secondary | ICD-10-CM

## 2015-04-25 DIAGNOSIS — Z8673 Personal history of transient ischemic attack (TIA), and cerebral infarction without residual deficits: Secondary | ICD-10-CM

## 2015-04-25 DIAGNOSIS — K920 Hematemesis: Secondary | ICD-10-CM | POA: Diagnosis present

## 2015-04-25 DIAGNOSIS — Z79899 Other long term (current) drug therapy: Secondary | ICD-10-CM

## 2015-04-25 DIAGNOSIS — E785 Hyperlipidemia, unspecified: Secondary | ICD-10-CM | POA: Diagnosis present

## 2015-04-25 DIAGNOSIS — R1110 Vomiting, unspecified: Secondary | ICD-10-CM | POA: Diagnosis present

## 2015-04-25 HISTORY — DX: Vesicointestinal fistula: N32.1

## 2015-04-25 HISTORY — DX: Unspecified intestinal obstruction, unspecified as to partial versus complete obstruction: K56.609

## 2015-04-25 LAB — CBC
HEMATOCRIT: 43.5 % (ref 36.0–46.0)
HEMATOCRIT: 42.9 % (ref 36.0–46.0)
HEMOGLOBIN: 14.4 g/dL (ref 12.0–15.0)
Hemoglobin: 13.9 g/dL (ref 12.0–15.0)
MCH: 29.7 pg (ref 26.0–34.0)
MCH: 29 pg (ref 26.0–34.0)
MCHC: 33.1 g/dL (ref 30.0–36.0)
MCHC: 32.4 g/dL (ref 30.0–36.0)
MCV: 89.7 fL (ref 78.0–100.0)
MCV: 89.6 fL (ref 78.0–100.0)
PLATELETS: 336 10*3/uL (ref 150–400)
Platelets: 305 10*3/uL (ref 150–400)
RBC: 4.85 MIL/uL (ref 3.87–5.11)
RBC: 4.79 MIL/uL (ref 3.87–5.11)
RDW: 15.4 % (ref 11.5–15.5)
RDW: 15.5 % (ref 11.5–15.5)
WBC: 11.3 10*3/uL — ABNORMAL HIGH (ref 4.0–10.5)
WBC: 12.6 10*3/uL — ABNORMAL HIGH (ref 4.0–10.5)

## 2015-04-25 LAB — CBC WITH DIFFERENTIAL/PLATELET
Basophils Absolute: 0.1 10*3/uL (ref 0.0–0.1)
Basophils Relative: 1 %
EOS ABS: 0.2 10*3/uL (ref 0.0–0.7)
Eosinophils Relative: 2 %
HCT: 43.5 % (ref 36.0–46.0)
HEMOGLOBIN: 14.1 g/dL (ref 12.0–15.0)
LYMPHS ABS: 1.8 10*3/uL (ref 0.7–4.0)
Lymphocytes Relative: 18 %
MCH: 29.3 pg (ref 26.0–34.0)
MCHC: 32.4 g/dL (ref 30.0–36.0)
MCV: 90.2 fL (ref 78.0–100.0)
Monocytes Absolute: 0.9 10*3/uL (ref 0.1–1.0)
Monocytes Relative: 9 %
Neutro Abs: 6.9 10*3/uL (ref 1.7–7.7)
Neutrophils Relative %: 70 %
Platelets: 349 10*3/uL (ref 150–400)
RBC: 4.82 MIL/uL (ref 3.87–5.11)
RDW: 15.6 % — ABNORMAL HIGH (ref 11.5–15.5)
WBC: 9.8 10*3/uL (ref 4.0–10.5)

## 2015-04-25 LAB — I-STAT CHEM 8, ED
BUN: 16 mg/dL (ref 6–20)
CHLORIDE: 99 mmol/L — AB (ref 101–111)
CREATININE: 0.7 mg/dL (ref 0.44–1.00)
Calcium, Ion: 1.18 mmol/L (ref 1.13–1.30)
Glucose, Bld: 134 mg/dL — ABNORMAL HIGH (ref 65–99)
HEMATOCRIT: 49 % — AB (ref 36.0–46.0)
Hemoglobin: 16.7 g/dL — ABNORMAL HIGH (ref 12.0–15.0)
POTASSIUM: 4.3 mmol/L (ref 3.5–5.1)
SODIUM: 141 mmol/L (ref 135–145)
TCO2: 31 mmol/L (ref 0–100)

## 2015-04-25 LAB — HEPATIC FUNCTION PANEL
ALBUMIN: 4 g/dL (ref 3.5–5.0)
ALT: 13 U/L — ABNORMAL LOW (ref 14–54)
AST: 73 U/L — AB (ref 15–41)
Alkaline Phosphatase: 88 U/L (ref 38–126)
BILIRUBIN TOTAL: 1.7 mg/dL — AB (ref 0.3–1.2)
Bilirubin, Direct: 0.9 mg/dL — ABNORMAL HIGH (ref 0.1–0.5)
Indirect Bilirubin: 0.8 mg/dL (ref 0.3–0.9)
TOTAL PROTEIN: 6.5 g/dL (ref 6.5–8.1)

## 2015-04-25 LAB — BASIC METABOLIC PANEL
ANION GAP: 12 (ref 5–15)
BUN: 14 mg/dL (ref 6–20)
CO2: 26 mmol/L (ref 22–32)
Calcium: 9.9 mg/dL (ref 8.9–10.3)
Chloride: 99 mmol/L — ABNORMAL LOW (ref 101–111)
Creatinine, Ser: 0.77 mg/dL (ref 0.44–1.00)
GFR calc Af Amer: >60 mL/min (ref >60)
GFR calc non Af Amer: >60 mL/min (ref >60)
Glucose, Bld: 153 mg/dL — ABNORMAL HIGH (ref 65–99)
POTASSIUM: 6.2 mmol/L — AB (ref 3.5–5.1)
SODIUM: 137 mmol/L (ref 135–145)

## 2015-04-25 LAB — OCCULT BLOOD GASTRIC / DUODENUM (SPECIMEN CUP): Occult Blood, Gastric: POSITIVE — AB

## 2015-04-25 LAB — TYPE AND SCREEN
ABO/RH(D): O POS
Antibody Screen: NEGATIVE

## 2015-04-25 LAB — POC OCCULT BLOOD, ED: FECAL OCCULT BLD: NEGATIVE

## 2015-04-25 LAB — LIPASE, BLOOD: Lipase: 65 U/L — ABNORMAL HIGH (ref 11–51)

## 2015-04-25 LAB — TSH: TSH: 1.353 u[IU]/mL (ref 0.350–4.500)

## 2015-04-25 MED ORDER — ONDANSETRON HCL 4 MG/2ML IJ SOLN
4.0000 mg | Freq: Once | INTRAMUSCULAR | Status: DC
  Filled 2015-04-25: qty 2

## 2015-04-25 MED ORDER — FENTANYL CITRATE (PF) 100 MCG/2ML IJ SOLN
50.0000 ug | Freq: Once | INTRAMUSCULAR | Status: AC
  Administered 2015-04-25: 50 ug via INTRAVENOUS
  Filled 2015-04-25: qty 2

## 2015-04-25 MED ORDER — LORAZEPAM 2 MG/ML IJ SOLN: 0.5000 mg | Freq: Four times a day (QID) | INTRAMUSCULAR | Status: DC | PRN

## 2015-04-25 MED ORDER — ACETAMINOPHEN 325 MG PO TABS: 650.0000 mg | ORAL_TABLET | Freq: Four times a day (QID) | ORAL | Status: DC | PRN

## 2015-04-25 MED ORDER — SODIUM CHLORIDE 0.9 % IV SOLN
INTRAVENOUS | Status: AC
  Administered 2015-04-25 (×2): via INTRAVENOUS

## 2015-04-25 MED ORDER — ONDANSETRON HCL 4 MG/2ML IJ SOLN
4.0000 mg | Freq: Once | INTRAMUSCULAR | Status: AC
  Administered 2015-04-25: 4 mg via INTRAVENOUS

## 2015-04-25 MED ORDER — DIATRIZOATE MEGLUMINE & SODIUM 66-10 % PO SOLN
90.0000 mL | Freq: Once | ORAL | Status: AC
  Administered 2015-04-25: 90 mL via NASOGASTRIC

## 2015-04-25 MED ORDER — SODIUM CHLORIDE 0.9 % IV BOLUS (SEPSIS)
500.0000 mL | Freq: Once | INTRAVENOUS | Status: AC
  Administered 2015-04-25: 500 mL via INTRAVENOUS

## 2015-04-25 MED ORDER — POTASSIUM CHLORIDE IN NACL 20-0.9 MEQ/L-% IV SOLN
INTRAVENOUS | Status: DC
  Administered 2015-04-26 – 2015-04-27 (×2): via INTRAVENOUS
  Filled 2015-04-25 (×2): qty 1000

## 2015-04-25 MED ORDER — PANTOPRAZOLE SODIUM 40 MG IV SOLR
40.0000 mg | Freq: Two times a day (BID) | INTRAVENOUS | Status: DC
  Administered 2015-04-25 – 2015-04-27 (×5): 40 mg via INTRAVENOUS
  Filled 2015-04-25 (×5): qty 40

## 2015-04-25 MED ORDER — DIATRIZOATE MEGLUMINE & SODIUM 66-10 % PO SOLN
ORAL | Status: AC
  Filled 2015-04-25: qty 90

## 2015-04-25 MED ORDER — ONDANSETRON HCL 4 MG/2ML IJ SOLN: 4.0000 mg | Freq: Four times a day (QID) | INTRAMUSCULAR | Status: DC | PRN

## 2015-04-25 MED ORDER — PHENOL 1.4 % MT LIQD: 1.0000 | OROMUCOSAL | Status: DC | PRN

## 2015-04-25 MED ORDER — ACETAMINOPHEN 650 MG RE SUPP: 650.0000 mg | Freq: Four times a day (QID) | RECTAL | Status: DC | PRN

## 2015-04-25 MED ORDER — HEPARIN SODIUM (PORCINE) 5000 UNIT/ML IJ SOLN
5000.0000 [IU] | Freq: Three times a day (TID) | INTRAMUSCULAR | Status: DC
  Filled 2015-04-25: qty 1

## 2015-04-25 MED ORDER — ONDANSETRON HCL 4 MG PO TABS: 4.0000 mg | ORAL_TABLET | Freq: Four times a day (QID) | ORAL | Status: DC | PRN

## 2015-04-25 NOTE — ED Notes (Signed)
MD at bedside. 

## 2015-04-25 NOTE — H&P (Signed)
[facility] History and Physical                                                                                    Yvonne Espinoza, is a 74 y.o. female  MRN: [id number]   DOB - Feb 25, 1941  Admit Date - 04/25/2015  Outpatient Primary MD for the patient is Baruch Gouty, MD  Referring Physician:  Dr. Bebe Shaggy  Chief Complaint:   Chief Complaint  Patient presents with  . Hematemesis     HPI  Yvonne Espinoza  is a 74 y.o. female, with GERD and hyperlipidemia as well as a history of ileostomy last May 2016.  She presents to the ER for vomiting and abdominal pain.  The patient reports that starting last Wednesday she began to have bloating and a pulling sensation at her previous surgical site.  She had some emesis and was seen by her PCP.  Xrays last Friday showed PSBO.  Fortunately the patient improved on her own.  Yesterday the patient began to have bloating and pain again.  She took her medications last night at approximately 11 pm  with cherry juice.  At 2:00 am she had chills and began to vomit.  It appeared to be mixed with bright red blood.  She came to the ER and continued to vomit / dry heave.  CT abd/pelvis shows a PSBO with transition in the LLQ and adhesion changes noted in the RLQ.   As an aside the patient mentions that she has been under an unusual amount of stress since may and wonders if she has an ulcer.   Also the patient mentions that her sister had recurrent C-diff and died on the OR table two years ago when having surgery for diverticulitis.  Hgb is 16.7.  WBC is WNL.    Review of Systems  Constitutional: Positive for chills.  HENT: Negative.   Eyes: Negative.   Respiratory: Negative.   Cardiovascular: Negative.   Gastrointestinal: Positive for heartburn, nausea, vomiting and abdominal pain.  Genitourinary: Negative.   Musculoskeletal: Negative.   Skin: Negative.   Neurological: Positive for weakness.  Endo/Heme/Allergies: Negative.   Psychiatric/Behavioral:  The patient is nervous/anxious.      Past Medical History  Past Medical History  Diagnosis Date  . TIA (transient ischemic attack)     2209,2010  . Spinal stenosis   . Diverticulitis   . Stroke [facility])     TIA 2009  . GERD (gastroesophageal reflux disease)   . Arthritis     RA  . Abscess     colon  . Kidney cysts   . Wears glasses   . 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 Shoulder surgery      right 2010  . Knee arthroscopy Bilateral   . Ileostomy N/A 06/26/2014    Procedure: LOOP ILEOSTOMY;  Surgeon: Frederik Schmidt, MD;  Location: [facility] OR;  Service: General;  Laterality: N/A;  . Colostomy revision N/A 06/26/2014    Procedure: SIGMOID COLECTOMY WITH BLADDER REPAIR;  Surgeon: Frederik Schmidt, MD;  Location: [facility] OR;  Service: General;  Laterality: N/A;  . Cystoscopy  w/ ureteral stent placement Left 06/26/2014    Procedure: BOARI FLAP LEFT URETERAL REIMPLANT;  Surgeon: Chelsea Aus, MD;  Location: [facility] OR;  Service: Urology;  Laterality: Left;  . Dilation and curettage of uterus    . Colostomy reversal  09/26/2014    dr wyatt  . Transverse loop colostomy N/A 09/26/2014    Procedure: LOOP COLOSTOMY REVERESAL;  Surgeon: Jimmye Norman, MD;  Location: [facility] OR;  Service: General;  Laterality: N/A;  . Flexible sigmoidoscopy N/A 09/26/2014    Procedure: RIGID SIGMOIDOSCOPY;  Surgeon: Jimmye Norman, MD;  Location: [facility] OR;  Service: General;  Laterality: N/A;  . Esophageal dilation  10/10/08  . Cardiovascular stress test  08/08/11 and 06/07/10      Social History Social History  Substance Use Topics  . Smoking status: Never Smoker   . Smokeless tobacco: Never Used  . Alcohol Use: Yes     Comment: "rarely"  at home with husband.  Independent with ADLs  Family History Family History  Problem Relation Age of Onset  . Diabetes Mother   . Diabetes Father   . Heart attack Father   . Heart disease Father   . Diabetes Sister   . Stroke  Sister   . Glaucoma Brother   . Diabetes Brother   . Diabetes Sister   . Hypertension Son   . Stroke Maternal Aunt   . Cancer Neg Hx   . COPD Neg Hx     Prior to Admission medications   Medication Sig Start Date End Date Taking? Authorizing Provider  aspirin EC 325 MG tablet Take 325 mg by mouth daily.   Yes Historical Provider, MD  BIOTIN PO Take 10,000 mg by mouth daily.   Yes Historical Provider, MD  Coenzyme Q10 (CO Q-10) 100 MG CAPS Take 100 mg by mouth daily.   Yes Historical Provider, MD  folic acid (FOLVITE) 1 MG tablet Take 1 mg by mouth daily.    Yes Historical Provider, MD  Garlic 1000 MG CAPS Take 1,000 mg by mouth daily.   Yes Historical Provider, MD  Lutein 40 MG CAPS Take 40 mg by mouth daily.   Yes Historical Provider, MD  Multiple Vitamin (MULTIVITAMIN WITH MINERALS) TABS tablet Take 1 tablet by mouth daily.   Yes Historical Provider, MD  Probiotic Product (VSL#3 PO) Take 1 tablet by mouth daily.   Yes Historical Provider, MD    Allergies  Allergen Reactions  . Gabapentin Other (See Comments)    "every side effect listed on pamphlet"  . Neosporin [Neomycin-Bacitracin Zn-Polymyx]   . Prednisone Other (See Comments)    Tachycardia/mood swings  . Statins Other (See Comments)    Swelling     Physical Exam  Vitals  Blood pressure 162/79, pulse 81, temperature 97.8 F (36.6 C), temperature source Oral, resp. rate 14, SpO2 94 %.   General: Wd, Wn, Pleasant,  lying in bed in NAD  Psych:  Normal affect and insight, Not Suicidal or Homicidal, Awake Alert, Oriented X 3.  Neuro:   No F.N deficits, ALL C.Nerves Intact, Strength 5/5 all 4 extremities, Sensation intact all 4 extremities.  ENT:  Ears and Eyes appear Normal, Conjunctivae clear, PER. Moist oral mucosa without erythema or exudates.  Neck:  Supple, No lymphadenopathy appreciated  Respiratory:  Symmetrical chest wall movement, Good air movement bilaterally, CTAB.  Cardiac:  RRR, No Murmurs, no LE  edema noted, no JVD.    Abdomen:  Positive bowel sounds, Soft, Non tender, Non distended,  No masses appreciated  Skin:  No Cyanosis, Normal Skin Turgor, No Skin Rash or Bruise.  Extremities:  Able to move all 4. 5/5 strength in each,  no effusions.  Data Review  Wt Readings from Last 3 Encounters:  04/20/15 53.524 kg (118 lb)  09/26/14 53.524 kg (118 lb)  09/22/14 51.665 kg (113 lb 14.4 oz)    CBC  Recent Labs Lab 04/20/15 0950 04/25/15 0400 04/25/15 0524  WBC 7.1 9.8  --   HGB  --  14.1 16.7*  HCT 42.6 43.5 49.0*  PLT  --  349  --   MCV  --  90.2  --   MCH 30.0 29.3  --   MCHC 33.3 32.4  --   RDW 16.3* 15.6*  --   LYMPHSABS 1.9 1.8  --   MONOABS  --  0.9  --   EOSABS  --  0.2  --   BASOSABS  --  0.1  --     Chemistries   Recent Labs Lab 04/20/15 0950 04/25/15 0400 04/25/15 0524  NA 143 137 141  K 4.9 6.2* 4.3  CL 100 99* 99*  CO2 26 26  --   GLUCOSE 88 153* 134*  BUN 14 14 16   CREATININE 0.84 0.77 0.70  CALCIUM 9.9 9.9  --   AST 20 73*  --   ALT 13 13*  --   ALKPHOS 94 88  --   BILITOT 0.4 1.7*  --       estimated creatinine clearance is 45 mL/min (by C-G formula based on Cr of 0.7).  No results for input(s): TSH, T4TOTAL, T3FREE, THYROIDAB in the last 72 hours.  Invalid input(s): FREET3  Lab Results  Component Value Date   HGBA1C 6.2* 06/22/2014    CREATININE: 0.7 (04/25/15 0524) Estimated creatinine clearance - 45 mL/min   Urinalysis    Component Value Date/Time   COLORURINE YELLOW 09/28/2014 2031   APPEARANCEUR CLEAR 09/28/2014 2031   LABSPEC 1.005 09/28/2014 2031   PHURINE 6.5 09/28/2014 2031   GLUCOSEU Negative 04/20/2015 0950   HGBUR TRACE* 09/28/2014 2031   BILIRUBINUR Negative 04/20/2015 0950   BILIRUBINUR NEGATIVE 09/28/2014 2031   KETONESUR NEGATIVE 09/28/2014 2031   PROTEINUR NEGATIVE 09/28/2014 2031   UROBILINOGEN 0.2 09/28/2014 2031   NITRITE Negative 04/20/2015 0950   NITRITE NEGATIVE 09/28/2014 2031    LEUKOCYTESUR Negative 04/20/2015 0950   LEUKOCYTESUR NEGATIVE 09/28/2014 2031    Imaging results:   Ct Abdomen Pelvis W Contrast  04/25/2015  CLINICAL DATA:  Abdominal pain and vomiting EXAM: CT ABDOMEN AND PELVIS WITH CONTRAST TECHNIQUE: Multidetector CT imaging of the abdomen and pelvis was performed using the standard protocol following bolus administration of intravenous contrast. CONTRAST:  100 cc Omnipaque 300 intravenous COMPARISON:  06/21/2014 FINDINGS: Lower chest and abdominal wall:  No contributory findings. Hepatobiliary: Sub cm cysts in the medial left liver and subcapsular posterior right liver.No evidence of biliary obstruction or stone. Pancreas: Unremarkable. Spleen: Chronic granulomatous changes Adrenals/Urinary Tract: Negative adrenals. Bilateral renal sinus cysts. Unremarkable bladder. Reproductive:No pathologic findings. Stomach/Bowel: Dilated and fluid-filled proximal small bowel with normalization via the mid jejunum at the level of the left lower quadrant. No discrete transition point is identified. There is no evidence of mass or hernia. Changes may reflect adhesive disease, which is seen in the right lower quadrant where ileal loops are abnormally close to the distorted cecum. No appendicitis or other bowel inflammation. Status post sigmoidectomy with patent anastomosis. Vascular/Lymphatic: No acute vascular abnormality.  No mass or adenopathy. Peritoneal: No ascites or pneumoperitoneum. Musculoskeletal: No acute abnormalities. IMPRESSION: 1. Partial small bowel obstruction with transition in the left lower quadrant. No discrete cause is identified. 2. Adhesive changes noted in the right lower quadrant but without regional obstruction. Electronically Signed   By: Marnee Spring M.D.   On: 04/25/2015 06:11   Dg Abd Acute W/chest  04/20/2015  CLINICAL DATA:  Abdominal pain bloating and vomiting 2 nights ago; history of previous diverticular abscess with surgery in February and May  2016 EXAM: DG ABDOMEN ACUTE W/ 1V CHEST COMPARISON:  PA and lateral chest x-ray of Sep 22, 2014 and supine abdominal film of July 03, 2014. FINDINGS: Chest x-ray: The lungs are adequately inflated and clear. The heart and pulmonary vascularity are normal. The mediastinum is normal in width. There is no pleural effusion. The bony thorax exhibits no acute abnormality. Within the abdomen the stool burden within the colon is moderate. There is no small or large bowel obstructive pattern. No free extraluminal gas collections are observed. There are no abnormal soft tissue calcifications. There is mild multilevel degenerative disc disease of the lumbar spine. There are degenerative changes of both hips. IMPRESSION: 1. There is no active cardiopulmonary disease. 2. No evidence of small or large bowel obstruction. Moderately increased colonic stool burden may reflect constipation in the appropriate clinical setting. If the patient's abdominal symptoms persist and remain unexplained, CT scanning would be useful next imaging step. Electronically Signed   By: David  [place] M.D.   On: 04/20/2015 11:40    My personal review of EKG: NSR, No ST changes noted. Normal QTc   Assessment & Plan  Principal Problem:   Partial small bowel obstruction (HCC) Active Problems:   Status post ileostomy (HCC)   Hyperlipidemia   Esophageal reflux   Hx-TIA (transient ischemic attack)  PSBO May be related to adhesions.  Patient's history sounds at though she has had PSBO outpatient that improved on its own.  Will eliminate narcotics and anticholinergics.  Ambulate. NG tube placed. Nothing by mouth. Surgery consulted.  Possible hematemesis We'll cycle CBC. Holding aspirin. Gastric occult emesis. Place on twice a day PPI.  Patient denies NSAID use. She does have GERD symptoms from time to time.  Hyperlipidemia Holding oral medications.  Previous history of TIA Will hold aspirin for now due to possible hematemesis, but  restart ASAP even if it needs to be per rectum.   Consultants Called:    General Surgery  Family Communication:     Patient is alert, orientated and understands their plan of care.  Code Status:    DNR  Condition:    Guarded.  Potential Disposition:   To home when OK with surgery.  Time spent in minutes : [address]   [facility] [place],  [place] on 04/25/2015 at 8:20 AM Between 7am to 7pm - Pager - [phone] After 7pm go to [email or web address] - password [email or web address] And look for the night coverage person covering me after hours

## 2015-04-25 NOTE — ED Notes (Signed)
Family at bedside. 

## 2015-04-25 NOTE — ED Provider Notes (Signed)
CSN: [id number]     Arrival date & time 04/25/15  0344 History   First MD Initiated Contact with Patient 04/25/15 0401     Chief Complaint  Patient presents with  . Hematemesis     Patient is a 74 y.o. female presenting with abdominal pain. The history is provided by the patient and the spouse.  Abdominal Pain Pain location:  Generalized Pain quality: bloating   Pain severity:  Moderate Onset quality:  Gradual Duration:  6 hours Timing:  Constant Progression:  Worsening Chronicity:  Recurrent Relieved by:  Nothing Worsened by:  Movement and palpation Associated symptoms: hematemesis, nausea and vomiting   Associated symptoms: no chest pain, no diarrhea, no dysuria, no fever, no hematochezia, no melena and no shortness of breath   Associated symptoms comment:  "heartburn"  Risk factors: multiple surgeries   Patient presents for bloating abdominal pain and two episodes of bloody emesis She reports h/o abdominal surgeries She reports she had similar episode of bloating last week but it resolved, saw her PCP and had negative Xrays after that visit She was told that if pain returned she should go to the ER  Past Medical History  Diagnosis Date  . TIA (transient ischemic attack)     2209,2010  . Spinal stenosis   . Diverticulitis   . Stroke [facility])     TIA 2009  . GERD (gastroesophageal reflux disease)   . Arthritis     RA  . Abscess     colon  . Kidney cysts   . Wears glasses   . 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 Shoulder surgery      right 2010  . Knee arthroscopy Bilateral   . Ileostomy N/A 06/26/2014    Procedure: LOOP ILEOSTOMY;  Surgeon: Frederik Schmidt, MD;  Location: [facility] OR;  Service: General;  Laterality: N/A;  . Colostomy revision N/A 06/26/2014    Procedure: SIGMOID COLECTOMY WITH BLADDER REPAIR;  Surgeon: Frederik Schmidt, MD;  Location: [facility] OR;  Service: General;  Laterality: N/A;  . Cystoscopy w/ ureteral  stent placement Left 06/26/2014    Procedure: BOARI FLAP LEFT URETERAL REIMPLANT;  Surgeon: Chelsea Aus, MD;  Location: [facility] OR;  Service: Urology;  Laterality: Left;  . Dilation and curettage of uterus    . Colostomy reversal  09/26/2014    dr wyatt  . Transverse loop colostomy N/A 09/26/2014    Procedure: LOOP COLOSTOMY REVERESAL;  Surgeon: Jimmye Norman, MD;  Location: [facility] OR;  Service: General;  Laterality: N/A;  . Flexible sigmoidoscopy N/A 09/26/2014    Procedure: RIGID SIGMOIDOSCOPY;  Surgeon: Jimmye Norman, MD;  Location: [facility] OR;  Service: General;  Laterality: N/A;  . Esophageal dilation  10/10/08  . Cardiovascular stress test  08/08/11 and 06/07/10   Family History  Problem Relation Age of Onset  . Diabetes Mother   . Diabetes Father   . Heart attack Father   . Heart disease Father   . Diabetes Sister   . Stroke Sister   . Glaucoma Brother   . Diabetes Brother   . Diabetes Sister   . Hypertension Son   . Stroke Maternal Aunt   . Cancer Neg Hx   . COPD Neg Hx    Social History  Substance Use Topics  . Smoking status: Never Smoker   . Smokeless tobacco: Never Used  . Alcohol Use: Yes     Comment: "rarely"   OB History  No data available     Review of Systems  Constitutional: Negative for fever.  Respiratory: Negative for shortness of breath.   Cardiovascular: Negative for chest pain.  Gastrointestinal: Positive for nausea, vomiting, abdominal pain and hematemesis. Negative for diarrhea, melena and hematochezia.  Genitourinary: Negative for dysuria.  All other systems reviewed and are negative.     Allergies  Gabapentin; Neosporin; Prednisone; and Statins  Home Medications   Prior to Admission medications   Medication Sig Start Date End Date Taking? Authorizing Provider  aspirin EC 325 MG tablet Take 325 mg by mouth daily.   Yes Historical Provider, MD  BIOTIN PO Take 10,000 mg by mouth daily.   Yes Historical Provider, MD  Coenzyme Q10 (CO Q-10) 100 MG CAPS  Take 100 mg by mouth daily.   Yes Historical Provider, MD  folic acid (FOLVITE) 1 MG tablet Take 1 mg by mouth daily.    Yes Historical Provider, MD  Garlic 1000 MG CAPS Take 1,000 mg by mouth daily.   Yes Historical Provider, MD  Lutein 40 MG CAPS Take 40 mg by mouth daily.   Yes Historical Provider, MD  Multiple Vitamin (MULTIVITAMIN WITH MINERALS) TABS tablet Take 1 tablet by mouth daily.   Yes Historical Provider, MD  Probiotic Product (VSL#3 PO) Take 1 tablet by mouth daily.   Yes Historical Provider, MD   BP 160/91 mmHg  Pulse 83  Temp(Src) 97.9 F (36.6 C) (Oral)  Resp 16  SpO2 100% Physical Exam CONSTITUTIONAL: Well developed/well nourished HEAD: Normocephalic/atraumatic EYES: EOMI ENMT: Mucous membranes dry NECK: supple no meningeal signs SPINE/BACK:entire spine nontender CV: S1/S2 noted, no murmurs/rubs/gallops noted LUNGS: Lungs are clear to auscultation bilaterally, no apparent distress ABDOMEN: soft, moderate tenderness, mostly in RUQ.  She has well healing scars noted, no rebound or guarding, decreased bowel sounds throughout Rectal - brown stool noted, no blood or melena noted, hemoccult negative GU:no cva tenderness NEURO: Pt is awake/alert/appropriate, moves all extremitiesx4.   EXTREMITIES: pulses normal/equal, full ROM SKIN: warm, color normal PSYCH: no abnormalities of mood noted, alert and oriented to situation  ED Course  Procedures   6:30 AM Pt with  Complex abdominal surgical history including sigmoid colectomy earlier this year, now presents with abdominal pain/bloating with vomiting Ct reveals partial SBO Will need admission 7:35 AM D/w [facility] [employer], will admit Surgical consult deferred to inpatient team  Labs Review Labs Reviewed  CBC WITH DIFFERENTIAL/PLATELET - Abnormal; Notable for the following:    RDW 15.6 (*)    All other components within normal limits  BASIC METABOLIC PANEL - Abnormal; Notable for the following:    Potassium 6.2  (*)    Chloride 99 (*)    Glucose, Bld 153 (*)    All other components within normal limits  LIPASE, BLOOD - Abnormal; Notable for the following:    Lipase 65 (*)    All other components within normal limits  HEPATIC FUNCTION PANEL - Abnormal; Notable for the following:    AST 73 (*)    ALT 13 (*)    Total Bilirubin 1.7 (*)    Bilirubin, Direct 0.9 (*)    All other components within normal limits  I-STAT CHEM 8, ED - Abnormal; Notable for the following:    Chloride 99 (*)    Glucose, Bld 134 (*)    Hemoglobin 16.7 (*)    HCT 49.0 (*)    All other components within normal limits  POC OCCULT BLOOD, ED  TYPE AND  SCREEN    Imaging Review Ct Abdomen Pelvis W Contrast  04/25/2015  CLINICAL DATA:  Abdominal pain and vomiting EXAM: CT ABDOMEN AND PELVIS WITH CONTRAST TECHNIQUE: Multidetector CT imaging of the abdomen and pelvis was performed using the standard protocol following bolus administration of intravenous contrast. CONTRAST:  100 cc Omnipaque 300 intravenous COMPARISON:  06/21/2014 FINDINGS: Lower chest and abdominal wall:  No contributory findings. Hepatobiliary: Sub cm cysts in the medial left liver and subcapsular posterior right liver.No evidence of biliary obstruction or stone. Pancreas: Unremarkable. Spleen: Chronic granulomatous changes Adrenals/Urinary Tract: Negative adrenals. Bilateral renal sinus cysts. Unremarkable bladder. Reproductive:No pathologic findings. Stomach/Bowel: Dilated and fluid-filled proximal small bowel with normalization via the mid jejunum at the level of the left lower quadrant. No discrete transition point is identified. There is no evidence of mass or hernia. Changes may reflect adhesive disease, which is seen in the right lower quadrant where ileal loops are abnormally close to the distorted cecum. No appendicitis or other bowel inflammation. Status post sigmoidectomy with patent anastomosis. Vascular/Lymphatic: No acute vascular abnormality. No mass or  adenopathy. Peritoneal: No ascites or pneumoperitoneum. Musculoskeletal: No acute abnormalities. IMPRESSION: 1. Partial small bowel obstruction with transition in the left lower quadrant. No discrete cause is identified. 2. Adhesive changes noted in the right lower quadrant but without regional obstruction. Electronically Signed   By: Marnee Spring M.D.   On: 04/25/2015 06:11   I have personally reviewed and evaluated these images and lab results as part of my medical decision-making.   EKG Interpretation   Date/Time:  Wednesday April 25 2015 05:02:35 EST Ventricular Rate:  76 PR Interval:  160 QRS Duration: 106 QT Interval:  409 QTC Calculation: 460 R Axis:   53 Text Interpretation:  Sinus rhythm Baseline wander in lead(s) V2 Biatrial  enlargement No significant change since last tracing ECG OTHERWISE WITHIN  NORMAL LIMITS Confirmed by Bebe Shaggy  MD, Quindarius Cabello ([id number]) on 04/25/2015  5:13:50 AM     Medications  ondansetron (ZOFRAN) injection 4 mg (0 mg Intravenous Hold 04/25/15 0719)  ondansetron (ZOFRAN) injection 4 mg (4 mg Intravenous Given 04/25/15 0410)  sodium chloride 0.9 % bolus 500 mL (0 mLs Intravenous Stopped 04/25/15 0643)  ondansetron (ZOFRAN) injection 4 mg (4 mg Intravenous Given 04/25/15 0559)  fentaNYL (SUBLIMAZE) injection 50 mcg (50 mcg Intravenous Given 04/25/15 0717)    MDM   Final diagnoses:  Partial small bowel obstruction [facility])    Nursing notes including past medical history and social history reviewed and considered in documentation Labs/vital reviewed myself and considered during evaluation Previous records reviewed and considered     Zadie Rhine, MD 04/25/15 [phone]

## 2015-04-25 NOTE — ED Notes (Signed)
Witnessed rectal exam by Dr. Bebe Shaggy.

## 2015-04-25 NOTE — ED Notes (Signed)
Vomited about 200cc blood and mucous with RN

## 2015-04-25 NOTE — ED Notes (Signed)
Pt arrives c/o hematemesis starting tonight, states she recently had surgery for diverticulitis. Reports "its started all over again tonight"

## 2015-04-25 NOTE — Consult Note (Signed)
Reason for Consult:  SBO/PSBO Referring Physician: Dr. Jim Desanctis Yvonne Espinoza is an 74 y.o. female.   HPI: Unfortunate woman with sigmoid diverticulitis and colovesical fistula with Boari flap, reimplant of left ureter, bladder repair, sigmoid colectomy, and ileostomy 06/28/14, Dr. [name]. Then LOOP COLOSTOMY REVERSAL, RIGID SIGMOIDOSCOPY, on 09/26/14.  Now we she returns with recurrent vomiting, and SBO.  She has had some of these episodes at home starting last WED, she had some bloating and nausea.  It got better, but took Miralax for BM on Thursday and FRiday last week.  She was ok till yesterday when bloating and nausea returned. It did not get better, she  planning to see Dr. [name] at her PCP's urging, but came to Ed with return of symptoms.  Work up show she is afebrile, VSS, BP is up some K+ was elevated but recheck shows it to be normal.   CBC is normal repeat H/H is up.  CT scan shows Dilated and fluid-filled proximal small bowel with normalization via the mid jejunum at the level of the left lower quadrant. No discrete transition point is identified. There is no evidence of mass or hernia. Changes may reflect adhesive disease, which is seen in the right lower quadrant where ileal loops are abnormally close to the distorted cecum. No appendicitis or other bowel inflammation. Status post sigmoidectomy with patent anastomosis.    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 Shoulder surgery      right 2010  . Knee arthroscopy Bilateral   . Ileostomy N/A 06/26/2014    Procedure: LOOP ILEOSTOMY;  Surgeon: Doreen Salvage, MD;  Location: [place];  Service:  General;  Laterality: N/A;  . Colostomy revision N/A 06/26/2014    Procedure: SIGMOID COLECTOMY WITH BLADDER REPAIR;  Surgeon: Doreen Salvage, MD;  Location: [place];  Service: General;  Laterality: N/A;  . Cystoscopy w/ ureteral stent placement Left 06/26/2014    Procedure: BOARI FLAP LEFT URETERAL REIMPLANT;  Surgeon: Jorja Loa, MD;  Location: [place];  Service: Urology;  Laterality: Left;  . Dilation and curettage of uterus    . Colostomy reversal  09/26/2014    dr wyatt  . Transverse loop colostomy N/A 09/26/2014    Procedure: LOOP COLOSTOMY REVERESAL;  Surgeon: Judeth Horn, MD;  Location: [place];  Service: General;  Laterality: N/A;  . Flexible sigmoidoscopy N/A 09/26/2014    Procedure: RIGID SIGMOIDOSCOPY;  Surgeon: Judeth Horn, MD;  Location: [place];  Service: General;  Laterality: N/A;  . Esophageal dilation  10/10/08  . Cardiovascular stress test  08/08/11 and 06/07/10    Family History  Problem Relation Age of Onset  . Diabetes Mother   . Diabetes Father   . Heart attack Father   . Heart disease Father   . Diabetes Sister   . Stroke Sister   . Glaucoma Brother   . Diabetes Brother   . Diabetes Sister   . Hypertension Son   . Stroke Maternal Aunt   . Cancer Neg Hx   . COPD Neg Hx     Social History:  reports that she has  never smoked. She has never used smokeless tobacco. She reports that she drinks alcohol. She reports that she does not use illicit drugs.  Allergies:  Allergies  Allergen Reactions  . Gabapentin Other (See Comments)    "every side effect listed on pamphlet"  . Neosporin [Neomycin-Bacitracin Zn-Polymyx]   . Prednisone Other (See Comments)    Tachycardia/mood swings  . Statins Other (See Comments)    Swelling     Prior to Admission medications   Medication Sig Start Date End Date Taking? Authorizing Provider  aspirin EC 325 MG tablet Take 325 mg by mouth daily.   Yes Historical Provider, MD  BIOTIN PO Take 10,000 mg by mouth daily.   Yes Historical  Provider, MD  Coenzyme Q10 (CO Q-10) 100 MG CAPS Take 100 mg by mouth daily.   Yes Historical Provider, MD  folic acid (FOLVITE) 1 MG tablet Take 1 mg by mouth daily.    Yes Historical Provider, MD  Garlic 3557 MG CAPS Take 1,000 mg by mouth daily.   Yes Historical Provider, MD  Lutein 40 MG CAPS Take 40 mg by mouth daily.   Yes Historical Provider, MD  Multiple Vitamin (MULTIVITAMIN WITH MINERALS) TABS tablet Take 1 tablet by mouth daily.   Yes Historical Provider, MD  Probiotic Product (VSL#3 PO) Take 1 tablet by mouth daily.   Yes Historical Provider, MD    Scheduled: . ondansetron (ZOFRAN) IV  4 mg Intravenous Once  . pantoprazole (PROTONIX) IV  40 mg Intravenous Q12H   Continuous:  DUK:GURKYHCWC Anti-infectives    None      Results for orders placed or performed during the hospital encounter of 04/25/15 (from the past 48 hour(s))  CBC with Differential/Platelet     Status: Abnormal   Collection Time: 04/25/15  4:00 AM  Result Value Ref Range   WBC 9.8 4.0 - 10.5 K/uL   RBC 4.82 3.87 - 5.11 MIL/uL   Hemoglobin 14.1 12.0 - 15.0 g/dL   HCT 43.5 36.0 - 46.0 %   MCV 90.2 78.0 - 100.0 fL   MCH 29.3 26.0 - 34.0 pg   MCHC 32.4 30.0 - 36.0 g/dL   RDW 15.6 (H) 11.5 - 15.5 %   Platelets 349 150 - 400 K/uL   Neutrophils Relative % 70 %   Neutro Abs 6.9 1.7 - 7.7 K/uL   Lymphocytes Relative 18 %   Lymphs Abs 1.8 0.7 - 4.0 K/uL   Monocytes Relative 9 %   Monocytes Absolute 0.9 0.1 - 1.0 K/uL   Eosinophils Relative 2 %   Eosinophils Absolute 0.2 0.0 - 0.7 K/uL   Basophils Relative 1 %   Basophils Absolute 0.1 0.0 - 0.1 K/uL  Basic metabolic panel     Status: Abnormal   Collection Time: 04/25/15  4:00 AM  Result Value Ref Range   Sodium 137 135 - 145 mmol/L   Potassium 6.2 (HH) 3.5 - 5.1 mmol/L    Comment: DELTA CHECK NOTED HEMOLYSIS AT THIS LEVEL MAY AFFECT RESULT CRITICAL RESULT CALLED TO, READ BACK BY AND VERIFIED WITH: E OLSEN,RN [id number] WILDERK    Chloride 99 (L)  101 - 111 mmol/L   CO2 26 22 - 32 mmol/L   Glucose, Bld 153 (H) 65 - 99 mg/dL   BUN 14 6 - 20 mg/dL   Creatinine, Ser 0.77 0.44 - 1.00 mg/dL   Calcium 9.9 8.9 - 10.3 mg/dL   GFR calc non Af Amer >60 >60 mL/min   GFR calc  Af Amer >60 >60 mL/min    Comment: (NOTE) The eGFR has been calculated using the CKD EPI equation. This calculation has not been validated in all clinical situations. eGFR's persistently <60 mL/min signify possible Chronic Kidney Disease.    Anion gap 12 5 - 15  Lipase, blood     Status: Abnormal   Collection Time: 04/25/15  4:00 AM  Result Value Ref Range   Lipase 65 (H) 11 - 51 U/L  Hepatic function panel     Status: Abnormal   Collection Time: 04/25/15  4:00 AM  Result Value Ref Range   Total Protein 6.5 6.5 - 8.1 g/dL   Albumin 4.0 3.5 - 5.0 g/dL   AST 73 (H) 15 - 41 U/L   ALT 13 (L) 14 - 54 U/L   Alkaline Phosphatase 88 38 - 126 U/L   Total Bilirubin 1.7 (H) 0.3 - 1.2 mg/dL   Bilirubin, Direct 0.9 (H) 0.1 - 0.5 mg/dL   Indirect Bilirubin 0.8 0.3 - 0.9 mg/dL  Type and screen     Status: None   Collection Time: 04/25/15  4:30 AM  Result Value Ref Range   ABO/RH(D) O POS    Antibody Screen NEG    Sample Expiration 04/28/2015   POC occult blood, ED     Status: None   Collection Time: 04/25/15  4:43 AM  Result Value Ref Range   Fecal Occult Bld NEGATIVE NEGATIVE  I-stat chem 8, ed     Status: Abnormal   Collection Time: 04/25/15  5:24 AM  Result Value Ref Range   Sodium 141 135 - 145 mmol/L   Potassium 4.3 3.5 - 5.1 mmol/L   Chloride 99 (L) 101 - 111 mmol/L   BUN 16 6 - 20 mg/dL   Creatinine, Ser 0.70 0.44 - 1.00 mg/dL   Glucose, Bld 134 (H) 65 - 99 mg/dL   Calcium, Ion 1.18 1.13 - 1.30 mmol/L   TCO2 31 0 - 100 mmol/L   Hemoglobin 16.7 (H) 12.0 - 15.0 g/dL   HCT 49.0 (H) 36.0 - 46.0 %    Ct Abdomen Pelvis W Contrast  04/25/2015  CLINICAL DATA:  Abdominal pain and vomiting EXAM: CT ABDOMEN AND PELVIS WITH CONTRAST TECHNIQUE: Multidetector CT  imaging of the abdomen and pelvis was performed using the standard protocol following bolus administration of intravenous contrast. CONTRAST:  100 cc Omnipaque 300 intravenous COMPARISON:  06/21/2014 FINDINGS: Lower chest and abdominal wall:  No contributory findings. Hepatobiliary: Sub cm cysts in the medial left liver and subcapsular posterior right liver.No evidence of biliary obstruction or stone. Pancreas: Unremarkable. Spleen: Chronic granulomatous changes Adrenals/Urinary Tract: Negative adrenals. Bilateral renal sinus cysts. Unremarkable bladder. Reproductive:No pathologic findings. Stomach/Bowel: Dilated and fluid-filled proximal small bowel with normalization via the mid jejunum at the level of the left lower quadrant. No discrete transition point is identified. There is no evidence of mass or hernia. Changes may reflect adhesive disease, which is seen in the right lower quadrant where ileal loops are abnormally close to the distorted cecum. No appendicitis or other bowel inflammation. Status post sigmoidectomy with patent anastomosis. Vascular/Lymphatic: No acute vascular abnormality. No mass or adenopathy. Peritoneal: No ascites or pneumoperitoneum. Musculoskeletal: No acute abnormalities. IMPRESSION: 1. Partial small bowel obstruction with transition in the left lower quadrant. No discrete cause is identified. 2. Adhesive changes noted in the right lower quadrant but without regional obstruction. Electronically Signed   By: Monte Fantasia M.D.   On: 04/25/2015 06:11  Review of Systems  Constitutional: Positive for chills. Negative for fever, weight loss, malaise/fatigue and diaphoresis.  HENT: Negative.   Eyes: Negative.   Respiratory: Negative.   Cardiovascular: Negative.   Gastrointestinal: Positive for nausea, vomiting, abdominal pain and constipation (she took some mirlax on thrusday and friday last week). Negative for diarrhea.  Genitourinary: Negative.   Musculoskeletal: Negative.    Skin: Negative.   Neurological: Negative.  Negative for weakness.  Endo/Heme/Allergies: Negative.   Psychiatric/Behavioral: Negative.    Blood pressure 162/79, pulse 81, temperature 97.8 F (36.6 C), temperature source Oral, resp. rate 14, SpO2 94 %. Physical Exam  Constitutional: She is oriented to person, place, and time. She appears well-developed and well-nourished. No distress.  HENT:  Head: Normocephalic.  Nose: Nose normal.  Eyes: Conjunctivae are normal. Right eye exhibits no discharge. Left eye exhibits no discharge. No scleral icterus.  Neck: Normal range of motion. Neck supple. No JVD present. No tracheal deviation present. No thyromegaly present.  Cardiovascular: Normal rate, regular rhythm, normal heart sounds and intact distal pulses.   No murmur heard. Respiratory: Effort normal and breath sounds normal. No respiratory distress. She has no wheezes. She has no rales. She exhibits no tenderness.  GI: Soft. She exhibits distension (mild). She exhibits no mass. There is no tenderness. There is no rebound and no guarding.  BS present but hypoactive  Musculoskeletal: She exhibits no edema or tenderness.  Lymphadenopathy:    She has no cervical adenopathy.  Neurological: She is alert and oriented to person, place, and time. No cranial nerve deficit.  Skin: Skin is warm and dry. No rash noted. She is not diaphoretic. No erythema. No pallor.  Psychiatric: She has a normal mood and affect. Her behavior is normal. Judgment and thought content normal.    Assessment/Plan: PSBO vs SBO S/p sigmoid colectomy, repair of colovesical fistula,ileostomy 06/2014 and reversal of ileostomy 09/2014 Dr. [name] Hx of spinal stenosis Hx of TIA's Hx of GERD Arthritis  Plan:  Agree with admit, Place NG for decompression, hydrate and watch will recheck film tomorrow.  Yvonne Espinoza 04/25/2015, 8:08 AM

## 2015-04-26 ENCOUNTER — Inpatient Hospital Stay (HOSPITAL_COMMUNITY): Payer: PPO

## 2015-04-26 DIAGNOSIS — Z932 Ileostomy status: Secondary | ICD-10-CM

## 2015-04-26 LAB — BASIC METABOLIC PANEL
ANION GAP: 9 (ref 5–15)
BUN: 8 mg/dL (ref 6–20)
CHLORIDE: 107 mmol/L (ref 101–111)
CO2: 27 mmol/L (ref 22–32)
Calcium: 8.5 mg/dL — ABNORMAL LOW (ref 8.9–10.3)
Creatinine, Ser: 0.69 mg/dL (ref 0.44–1.00)
GFR calc non Af Amer: >60 mL/min (ref >60)
Glucose, Bld: 87 mg/dL (ref 65–99)
Potassium: 3.6 mmol/L (ref 3.5–5.1)
Sodium: 143 mmol/L (ref 135–145)

## 2015-04-26 LAB — CBC
HCT: 38.8 % (ref 36.0–46.0)
HEMATOCRIT: 40 % (ref 36.0–46.0)
HEMOGLOBIN: 12.7 g/dL (ref 12.0–15.0)
HEMOGLOBIN: 12.3 g/dL (ref 12.0–15.0)
MCH: 28.9 pg (ref 26.0–34.0)
MCH: 29 pg (ref 26.0–34.0)
MCHC: 31.7 g/dL (ref 30.0–36.0)
MCHC: 31.8 g/dL (ref 30.0–36.0)
MCV: 90.9 fL (ref 78.0–100.0)
MCV: 91.5 fL (ref 78.0–100.0)
Platelets: 297 10*3/uL (ref 150–400)
Platelets: 313 10*3/uL (ref 150–400)
RBC: 4.4 MIL/uL (ref 3.87–5.11)
RBC: 4.24 MIL/uL (ref 3.87–5.11)
RDW: 15.7 % — ABNORMAL HIGH (ref 11.5–15.5)
RDW: 16 % — ABNORMAL HIGH (ref 11.5–15.5)
WBC: 8.4 10*3/uL (ref 4.0–10.5)
WBC: 9.2 10*3/uL (ref 4.0–10.5)

## 2015-04-26 NOTE — Progress Notes (Signed)
[facility] PROGRESS NOTE  Yvonne Espinoza YBO:[id number] DOB: [birth year] DOA: 04/25/2015 PCP: Baruch Gouty, MD  Assessment/Plan: 1. Partial small bowel obstruction. -Patient presenting with multiple episodes of nausea and vomiting associate with abdominal pain. -CT scan of abdomen and pelvis showed findings suggestive of partial small bowel obstruction. -Given her surgical history suspect this may be related to adhesions. - NG tube placed. A reporting she had 600 mL drained overnght -Abdominal x-ray performed on 04/26/2015 showing progression of oral contrast without evidence of obstruction.  -Gen. surgery recommending placing her on ice chips with clapping of NG tube.  2.   Possible hematemesis.  - Present with multiple episodes of nausea vomiting in setting of partial small bowel obstruction.  She had reported emesis that was mixed with bright red blood.  - Hemoglobin remained stable at 12.3 -Has not had further episodes of hematemesis/GI bleed.  -She remains on IV Protonix  -5 to repeat CBC in a.m.   3.   History of TIA  -Antiplatelet therapy held due to possible hematemesis.   Code Status: DO NOT RESUSCITATE  Family Communication:  Disposition Plan: NG tube placed, to be supportive care, anticipate discharge home when medically stable    Consultants:  General surgery   Procedures:  NG tube placement    HPI/Subjective: Yvonne Espinoza is a pleasant 74 year old with extensive surgical history, including diverticulitis, located with colovesical this Celexa, reimplant of left ureter, bladder repair, colectomy and ileostomy placement on 06/28/2014. Status post colostomy reversal in May 2016. She presented to the emergency department on 04/25/2015 with complaints of nausea vomiting associate with abdominal pain. She was worked up with a CT scan of abdomen and pelvis with IV contrast that revealed a partial small bowel obstruction. General surgery was consulted. NG tube was  placed.   Objective: Filed Vitals:   04/26/15 0623 04/26/15 1300  BP: 139/69 148/58  Pulse: 70 76  Temp: 98.1 F (36.7 C) 97.7 F (36.5 C)  Resp: 18 18    Intake/Output Summary (Last 24 hours) at 04/26/15 1655 Last data filed at 04/26/15 1400  Gross per 24 hour  Intake 1578.33 ml  Output   1750 ml  Net -171.67 ml   Filed Weights   04/25/15 0835  Weight: 53.52 kg (117 lb 15.8 oz)    Exam:   General:  Patient is in no acute distress. She is awake and alert. NG tube in place  Cardiovascular: Regular rate and rhythm normal S1-S2 no murmurs rubs or gallops   Respiratory:  Normal respiratory effort lungs are clear to auscultation bilaterally   Abdomen: abdomen is not distended, hypoactive bowel sounds, nontender to palpation.   Musculoskeletal:  no edema   Data Reviewed: Basic Metabolic Panel:  Recent Labs Lab 04/20/15 0950 04/25/15 0400 04/25/15 0524 04/26/15 0500  NA 143 137 141 143  K 4.9 6.2* 4.3 3.6  CL 100 99* 99* 107  CO2 26 26  --  27  GLUCOSE 88 153* 134* 87  BUN 14 14 16 8   CREATININE 0.84 0.77 0.70 0.69  CALCIUM 9.9 9.9  --  8.5*   Liver Function Tests:  Recent Labs Lab 04/20/15 0950 04/25/15 0400  AST 20 73*  ALT 13 13*  ALKPHOS 94 88  BILITOT 0.4 1.7*  PROT 6.8 6.5  ALBUMIN 4.0 4.0    Recent Labs Lab 04/25/15 0400  LIPASE 65*   No results for input(s): AMMONIA in the last 168 hours. CBC:  Recent Labs Lab 04/20/15 [phone]  04/25/15 0400 04/25/15 0524 04/25/15 0817 04/25/15 1528 04/26/15 0004 04/26/15 0500  WBC 7.1  --  9.8  --  11.3* 12.6* 9.2 8.4  NEUTROABS 4.6  --  6.9  --   --   --   --   --   HGB  --   < > 14.1 16.7* 14.4 13.9 12.7 12.3  HCT 42.6  < > 43.5 49.0* 43.5 42.9 40.0 38.8  MCV  --   --  90.2  --  89.7 89.6 90.9 91.5  PLT  --   --  349  --  305 336 313 297  < > = values in this interval not displayed. Cardiac Enzymes: No results for input(s): CKTOTAL, CKMB, CKMBINDEX, TROPONINI in the last 168 hours. BNP  (last 3 results) No results for input(s): BNP in the last 8760 hours.  ProBNP (last 3 results) No results for input(s): PROBNP in the last 8760 hours.  CBG: No results for input(s): GLUCAP in the last 168 hours.  No results found for this or any previous visit (from the past 240 hour(s)).   Studies: Abd 1 View (kub)  04/26/2015  CLINICAL DATA:  Small bowel obstruction. EXAM: ABDOMEN - 1 VIEW COMPARISON:  04/25/2015 FINDINGS: Enteric tube remains in place with tip likely in the third portion of the duodenum. Oral contrast has progressed distally from the prior study with at most only a small amount of contrast remaining in small bowel. Large bowel contrast is present to the level of the descending colon. No bowel dilatation suggestive of obstruction is identified. Suture material is noted in the pelvis. Lumbar spondylosis is noted. IMPRESSION: Progressing oral contrast material without current evidence of obstruction. Electronically Signed   By: Sebastian Ache M.D.   On: 04/26/2015 08:02   Ct Abdomen Pelvis W Contrast  04/25/2015  CLINICAL DATA:  Abdominal pain and vomiting EXAM: CT ABDOMEN AND PELVIS WITH CONTRAST TECHNIQUE: Multidetector CT imaging of the abdomen and pelvis was performed using the standard protocol following bolus administration of intravenous contrast. CONTRAST:  100 cc Omnipaque 300 intravenous COMPARISON:  06/21/2014 FINDINGS: Lower chest and abdominal wall:  No contributory findings. Hepatobiliary: Sub cm cysts in the medial left liver and subcapsular posterior right liver.No evidence of biliary obstruction or stone. Pancreas: Unremarkable. Spleen: Chronic granulomatous changes Adrenals/Urinary Tract: Negative adrenals. Bilateral renal sinus cysts. Unremarkable bladder. Reproductive:No pathologic findings. Stomach/Bowel: Dilated and fluid-filled proximal small bowel with normalization via the mid jejunum at the level of the left lower quadrant. No discrete transition point is  identified. There is no evidence of mass or hernia. Changes may reflect adhesive disease, which is seen in the right lower quadrant where ileal loops are abnormally close to the distorted cecum. No appendicitis or other bowel inflammation. Status post sigmoidectomy with patent anastomosis. Vascular/Lymphatic: No acute vascular abnormality. No mass or adenopathy. Peritoneal: No ascites or pneumoperitoneum. Musculoskeletal: No acute abnormalities. IMPRESSION: 1. Partial small bowel obstruction with transition in the left lower quadrant. No discrete cause is identified. 2. Adhesive changes noted in the right lower quadrant but without regional obstruction. Electronically Signed   By: Marnee Spring M.D.   On: 04/25/2015 06:11   Dg Abd Portable 1v-small Bowel Obstruction Protocol-initial, 8 Hr Delay  04/26/2015  CLINICAL DATA:  74 year old female with small bowel obstruction. Follow-up delayed images. EXAM: PORTABLE ABDOMEN - 1 VIEW COMPARISON:  Radiograph dated 04/25/2015 at 2:33 p.m. FINDINGS: An enteric tube is noted with tip in the right hemiabdomen overlying the right L4  pedicle. Oral contrast is noted in the distal loops of small bowel and within the colon. There is no evidence of bowel obstruction. No free air. Contrast is noted within the urinary bladder. There is degenerative changes of spine. IMPRESSION: Oral contrast traverses into the colon. No evidence of bowel obstruction. Electronically Signed   By: Elgie Collard M.D.   On: 04/26/2015 02:52   Dg Abd Portable 1v-small Bowel Protocol-position Verification  04/25/2015  CLINICAL DATA:  Small bowel obstruction EXAM: PORTABLE ABDOMEN - 1 VIEW COMPARISON:  April 25, 2015 CT abdomen pelvis FINDINGS: The bowel gas pattern is normal. A feeding tube is identified with distal tip probably in the proximal duodenum. No radio-opaque calculi or other significant radiographic abnormality are seen. Residual contrast is identified in the bladder. IMPRESSION:  No plain film evidence of small bowel obstruction. A feeding tube is identified distal tip probably in the proximal duodenum. Electronically Signed   By: Sherian Rein M.D.   On: 04/25/2015 15:00    Scheduled Meds: . heparin  5,000 Units Subcutaneous 3 times per day  . ondansetron (ZOFRAN) IV  4 mg Intravenous Once  . pantoprazole (PROTONIX) IV  40 mg Intravenous Q12H   Continuous Infusions: . 0.9 % NaCl with KCl 20 mEq / L 100 mL/hr at 04/26/15 1349    Principal Problem:   Partial small bowel obstruction (HCC) Active Problems:   Status post ileostomy (HCC)   Hyperlipidemia   Esophageal reflux   Hx-TIA (transient ischemic attack)    Time spent: 25 min    Jeralyn Bennett  [facility] Pager [phone]. If 7PM-7AM, please contact night-coverage at [email or web address], password [facility] 04/26/2015, 4:55 PM  LOS: 1 day

## 2015-04-26 NOTE — Progress Notes (Addendum)
[facility]/Isa[facility] Progress Note    Subjective: Patient has passed some gas.  No bowel movement.  Objective: Vital signs in last 24 hours: Temp:  [98.1 F (36.7 C)-98.4 F (36.9 C)] 98.1 F (36.7 C) (12/22 0623) Pulse Rate:  [70-92] 70 (12/22 0623) Resp:  [16-18] 18 (12/22 0623) BP: (128-172)/(47-72) 139/69 mmHg (12/22 0623) SpO2:  [96 %-100 %] 99 % (12/22 0623) Last BM Date: 04/25/15  Intake/Output from previous day: 12/21 0701 - 12/22 0700 In: 800 [I.V.:800] Out: 2750 [Urine:1150; Emesis/NG output:1600] Intake/Output this shift: Total I/O In: -  Out: 200 [Urine:150; Emesis/NG output:50]  General: No acute distress.  Reviewing the X-rays, the patient's NGT was post-pyloric  Lungs: Clear  Abd: Soft, good bowel sounds.  Pulled NGT back a bit and then clamped.  Contrast is well into the colon since yesterday evening.  Extremities: No clinical signs or symptoms of DVT  Neuro: Intact  Lab Results:  @LABLAST2 (wbc:2,hgb:2,hct:2,plt:2) BMET ) Recent Labs  04/25/15 0400 04/25/15 0524 04/26/15 0500  NA 137 141 143  K 6.2* 4.3 3.6  CL 99* 99* 107  CO2 26  --  27  GLUCOSE 153* 134* 87  BUN 14 16 8   CREATININE 0.77 0.70 0.69  CALCIUM 9.9  --  8.5*   PT/INR No results for input(s): LABPROT, INR in the last 72 hours. ABG No results for input(s): PHART, HCO3 in the last 72 hours.  Invalid input(s): PCO2, PO2  Studies/Results: Abd 1 View (kub)  04/26/2015  CLINICAL DATA:  Small bowel obstruction. EXAM: ABDOMEN - 1 VIEW COMPARISON:  04/25/2015 FINDINGS: Enteric tube remains in place with tip likely in the third portion of the duodenum. Oral contrast has progressed distally from the prior study with at most only a small amount of contrast remaining in small bowel. Large bowel contrast is present to the level of the descending colon. No bowel dilatation suggestive of obstruction is identified. Suture material is noted in the pelvis. Lumbar spondylosis is noted. IMPRESSION:  Progressing oral contrast material without current evidence of obstruction. Electronically Signed   By: 04/28/2015 M.D.   On: 04/26/2015 08:02   Ct Abdomen Pelvis W Contrast  04/25/2015  CLINICAL DATA:  Abdominal pain and vomiting EXAM: CT ABDOMEN AND PELVIS WITH CONTRAST TECHNIQUE: Multidetector CT imaging of the abdomen and pelvis was performed using the standard protocol following bolus administration of intravenous contrast. CONTRAST:  100 cc Omnipaque 300 intravenous COMPARISON:  06/21/2014 FINDINGS: Lower chest and abdominal wall:  No contributory findings. Hepatobiliary: Sub cm cysts in the medial left liver and subcapsular posterior right liver.No evidence of biliary obstruction or stone. Pancreas: Unremarkable. Spleen: Chronic granulomatous changes Adrenals/Urinary Tract: Negative adrenals. Bilateral renal sinus cysts. Unremarkable bladder. Reproductive:No pathologic findings. Stomach/Bowel: Dilated and fluid-filled proximal small bowel with normalization via the mid jejunum at the level of the left lower quadrant. No discrete transition point is identified. There is no evidence of mass or hernia. Changes may reflect adhesive disease, which is seen in the right lower quadrant where ileal loops are abnormally close to the distorted cecum. No appendicitis or other bowel inflammation. Status post sigmoidectomy with patent anastomosis. Vascular/Lymphatic: No acute vascular abnormality. No mass or adenopathy. Peritoneal: No ascites or pneumoperitoneum. Musculoskeletal: No acute abnormalities. IMPRESSION: 1. Partial small bowel obstruction with transition in the left lower quadrant. No discrete cause is identified. 2. Adhesive changes noted in the right lower quadrant but without regional obstruction. Electronically Signed   By: 04/27/2015 M.D.   On: 04/25/2015  06:11   Dg Abd Portable 1v-small Bowel Obstruction Protocol-initial, 8 Hr Delay  04/26/2015  CLINICAL DATA:  74 year old female with small  bowel obstruction. Follow-up delayed images. EXAM: PORTABLE ABDOMEN - 1 VIEW COMPARISON:  Radiograph dated 04/25/2015 at 2:33 p.m. FINDINGS: An enteric tube is noted with tip in the right hemiabdomen overlying the right L4 pedicle. Oral contrast is noted in the distal loops of small bowel and within the colon. There is no evidence of bowel obstruction. No free air. Contrast is noted within the urinary bladder. There is degenerative changes of spine. IMPRESSION: Oral contrast traverses into the colon. No evidence of bowel obstruction. Electronically Signed   By: Elgie Collard M.D.   On: 04/26/2015 02:52   Dg Abd Portable 1v-small Bowel Protocol-position Verification  04/25/2015  CLINICAL DATA:  Small bowel obstruction EXAM: PORTABLE ABDOMEN - 1 VIEW COMPARISON:  April 25, 2015 CT abdomen pelvis FINDINGS: The bowel gas pattern is normal. A feeding tube is identified with distal tip probably in the proximal duodenum. No radio-opaque calculi or other significant radiographic abnormality are seen. Residual contrast is identified in the bladder. IMPRESSION: No plain film evidence of small bowel obstruction. A feeding tube is identified distal tip probably in the proximal duodenum. Electronically Signed   By: Sherian Rein M.D.   On: 04/25/2015 15:00    Anti-infectives: Anti-infectives    None      Assessment/Plan: s/p  Clamp NGT  Ice chips Follow clinically.  Either remove NGT later today or tomorrow AM  LOS: 1 day   Marta Lamas. Gae Bon, MD, FACS [phone] [facility] 04/26/2015

## 2015-04-27 ENCOUNTER — Telehealth: Payer: Self-pay | Admitting: Family Medicine

## 2015-04-27 DIAGNOSIS — Z8719 Personal history of other diseases of the digestive system: Secondary | ICD-10-CM | POA: Insufficient documentation

## 2015-04-27 LAB — CBC
HCT: 37 % (ref 36.0–46.0)
Hemoglobin: 12 g/dL (ref 12.0–15.0)
MCH: 29.7 pg (ref 26.0–34.0)
MCHC: 32.4 g/dL (ref 30.0–36.0)
MCV: 91.6 fL (ref 78.0–100.0)
PLATELETS: 279 10*3/uL (ref 150–400)
RBC: 4.04 MIL/uL (ref 3.87–5.11)
RDW: 15.6 % — ABNORMAL HIGH (ref 11.5–15.5)
WBC: 10.2 10*3/uL (ref 4.0–10.5)

## 2015-04-27 LAB — BASIC METABOLIC PANEL
ANION GAP: 10 (ref 5–15)
BUN: 8 mg/dL (ref 6–20)
CALCIUM: 8.6 mg/dL — AB (ref 8.9–10.3)
CO2: 23 mmol/L (ref 22–32)
Chloride: 108 mmol/L (ref 101–111)
Creatinine, Ser: 0.7 mg/dL (ref 0.44–1.00)
Glucose, Bld: 62 mg/dL — ABNORMAL LOW (ref 65–99)
POTASSIUM: 4.1 mmol/L (ref 3.5–5.1)
SODIUM: 141 mmol/L (ref 135–145)

## 2015-04-27 MED ORDER — PANTOPRAZOLE SODIUM 40 MG PO TBEC: 40.0000 mg | DELAYED_RELEASE_TABLET | Freq: Every day | ORAL | Status: DC

## 2015-04-27 NOTE — Progress Notes (Signed)
Yvonne Espinoza to be D/C'd home per MD order. Discussed with the patient and all questions fully answered.  VSS, Skin clean, dry and intact without evidence of skin break down, no evidence of skin tears noted.  IV catheter discontinued intact. Site without signs and symptoms of complications. Dressing and pressure applied.  An After Visit Summary was printed and given to the patient. Prescription was called in to patient's pharmacy.  D/c education completed with patient/family including follow up instructions, medication list, d/c activities limitations if indicated, with other d/c instructions as indicated by MD - patient able to verbalize understanding, all questions fully answered.   Patient instructed to return to ED, call 911, or call MD for any changes in condition.   Patient to be escorted via WC, and D/C home via private auto.

## 2015-04-27 NOTE — Telephone Encounter (Signed)
Pt called stated she wanted to let Dr. Sherie Don know she is in the hospital and that she had a blockage. Pt stated that she took Dr. Marlise Eves advice and went to the ER when the pain came back. Pt wants to thank Dr. Sherie Don for everything. Dr. Sherie Don can call pt if she likes. Thanks.

## 2015-04-27 NOTE — Discharge Summary (Addendum)
Physician Discharge Summary  Yvonne Espinoza Yvonne Espinoza KYH:[id number] DOB: May 25, 1940 DOA: 04/25/2015  PCP: Yvonne Gouty, MD  Admit date: 04/25/2015 Discharge date: 04/27/2015  Time spent: 35 minutes  Recommendations for Outpatient Follow-up:  1. Yvonne Espinoza was treated for small bowel obstruction during this hospitalization. Did well with advancement of diet. She was instructed to follow-up with her primary care physician in 1-2 weeks. 2. Please follow-up on CBC on hospital follow-up visit. She had multiple episodes of nausea and vomiting in setting of partial small bowel obstruction. Reported episode of blood mixed with emesis.   Discharge Diagnoses:  Principal Problem:   Partial small bowel obstruction (HCC) Active Problems:   Status post ileostomy (HCC)   Hyperlipidemia   Esophageal reflux   Hx-TIA (transient ischemic attack)   Discharge Condition: Stable  Diet recommendation: Soft diet  Filed Weights   04/25/15 0835  Weight: 53.52 kg (117 lb 15.8 oz)    History of present illness:  Yvonne Espinoza is a 74 y.o. female, with GERD and hyperlipidemia as well as a history of ileostomy last May 2016. She presents to the ER for vomiting and abdominal pain. The patient reports that starting last Wednesday she began to have bloating and a pulling sensation at her previous surgical site. She had some emesis and was seen by her PCP. Xrays last Friday showed PSBO. Fortunately the patient improved on her own. Yesterday the patient began to have bloating and pain again. She took her medications last night at approximately 11 pm with cherry juice. At 2:00 am she had chills and began to vomit. It appeared to be mixed with bright red blood. She came to the ER and continued to vomit / dry heave. CT abd/pelvis shows a PSBO with transition in the LLQ and adhesion changes noted in the RLQ. As an aside the patient mentions that she has been under an unusual amount of stress since may and  wonders if she has an ulcer. Also the patient mentions that her sister had recurrent C-diff and died on the OR table two years ago when having surgery for diverticulitis.  Hospital Course:  Yvonne Espinoza is a pleasant 74 year old with extensive surgical history, including diverticulitis, located with colovesical this Celexa, reimplant of left ureter, bladder repair, colectomy and ileostomy placement on 06/28/2014. Status post colostomy reversal in May 2016. She presented to the emergency department on 04/25/2015 with complaints of nausea vomiting associate with abdominal pain. She was worked up with a CT scan of abdomen and pelvis with IV contrast that revealed a partial small bowel obstruction. General surgery was consulted. NG tube was placed.   1. Partial small bowel obstruction. -Patient presenting with multiple episodes of nausea and vomiting associate with abdominal pain. -CT scan of abdomen and pelvis showed findings suggestive of partial small bowel obstruction. -Given her surgical history suspect this may be related to adhesions. - NG tube placed. A reporting she had 600 mL drained overnght -Abdominal x-ray performed on 04/26/2015 showing progression of oral contrast without evidence of obstruction.  -Her NG tube was discontinued as the right was advanced. She tolerated soft diet and was discharged to home in stable condition on 04/27/2015  2. Possible hematemesis.  - Present with multiple episodes of nausea vomiting in setting of partial small bowel obstruction. She had reported emesis that was mixed with bright red blood.  - Hemoglobin remained stable at 12.3 on 04/26/2015 -Has not had further episodes of hematemesis/GI bleed, likely be related to retching and multiple episodes of  nausea and vomiting. - Her hemoglobin remained stable on discharge at 12.0. Please follow-up with repeat CBC on hospital follow-up visit -She was discharged on Protonix 40 mg by mouth daily  3. History  of TIA  -Antiplatelet therapy held due to possible hematemesis  Procedures:  NG tube placement  Consultations:  General surgery  Discharge Exam: Filed Vitals:   04/26/15 2125 04/27/15 0427  BP: 157/67 145/55  Pulse: 96 91  Temp: 98.7 F (37.1 C) 98.8 F (37.1 C)  Resp: 18 17    General: Patient is sitting at bedside chair, and related to the hallway. She is anxious to go home today. Cardiovascular: Regular rate rhythm normal S1-S2 no murmurs rubs or gallops Respiratory: Abdomen is soft nontender nondistended positive bowel sounds Extremities: No edema   Discharge Instructions   Discharge Instructions    Call MD for:  difficulty breathing, headache or visual disturbances    Complete by:  As directed      Call MD for:  extreme fatigue    Complete by:  As directed      Call MD for:  hives    Complete by:  As directed      Call MD for:  persistant dizziness or light-headedness    Complete by:  As directed      Call MD for:  persistant nausea and vomiting    Complete by:  As directed      Call MD for:  redness, tenderness, or signs of infection (pain, swelling, redness, odor or green/yellow discharge around incision site)    Complete by:  As directed      Call MD for:  severe uncontrolled pain    Complete by:  As directed      Call MD for:  temperature >100.4    Complete by:  As directed      Call MD for:    Complete by:  As directed      Diet - low sodium heart healthy    Complete by:  As directed      Increase activity slowly    Complete by:  As directed           Current Discharge Medication List    START taking these medications   Details  pantoprazole (PROTONIX) 40 MG tablet Take 1 tablet (40 mg total) by mouth daily. Qty: 30 tablet, Refills: 0      CONTINUE these medications which have NOT CHANGED   Details  aspirin EC 325 MG tablet Take 325 mg by mouth daily.    BIOTIN PO Take 10,000 mg by mouth daily.    Coenzyme Q10 (CO Q-10) 100 MG CAPS Take  100 mg by mouth daily.    folic acid (FOLVITE) 1 MG tablet Take 1 mg by mouth daily.     Garlic 1000 MG CAPS Take 1,000 mg by mouth daily.    Lutein 40 MG CAPS Take 40 mg by mouth daily.    Multiple Vitamin (MULTIVITAMIN WITH MINERALS) TABS tablet Take 1 tablet by mouth daily.    Probiotic Product (VSL#3 PO) Take 1 tablet by mouth daily.       Allergies  Allergen Reactions  . Gabapentin Other (See Comments)    "every side effect listed on pamphlet"  . Neosporin [Neomycin-Bacitracin Zn-Polymyx]   . Prednisone Other (See Comments)    Tachycardia/mood swings  . Statins Other (See Comments)    Swelling    Follow-up Information    Follow up with Yvonne Gouty,  MD In 2 weeks.   Specialty:  Family Medicine   Contact information:   [address] [place] [address] [phone]        The results of significant diagnostics from this hospitalization (including imaging, microbiology, ancillary and laboratory) are listed below for reference.    Significant Diagnostic Studies: Abd 1 View (kub)  04/26/2015  CLINICAL DATA:  Small bowel obstruction. EXAM: ABDOMEN - 1 VIEW COMPARISON:  04/25/2015 FINDINGS: Enteric tube remains in place with tip likely in the third portion of the duodenum. Oral contrast has progressed distally from the prior study with at most only a small amount of contrast remaining in small bowel. Large bowel contrast is present to the level of the descending colon. No bowel dilatation suggestive of obstruction is identified. Suture material is noted in the pelvis. Lumbar spondylosis is noted. IMPRESSION: Progressing oral contrast material without current evidence of obstruction. Electronically Signed   By: Sebastian Ache M.D.   On: 04/26/2015 08:02   Ct Abdomen Pelvis W Contrast  04/25/2015  CLINICAL DATA:  Abdominal pain and vomiting EXAM: CT ABDOMEN AND PELVIS WITH CONTRAST TECHNIQUE: Multidetector CT imaging of the abdomen and pelvis was performed using the standard  protocol following bolus administration of intravenous contrast. CONTRAST:  100 cc Omnipaque 300 intravenous COMPARISON:  06/21/2014 FINDINGS: Lower chest and abdominal wall:  No contributory findings. Hepatobiliary: Sub cm cysts in the medial left liver and subcapsular posterior right liver.No evidence of biliary obstruction or stone. Pancreas: Unremarkable. Spleen: Chronic granulomatous changes Adrenals/Urinary Tract: Negative adrenals. Bilateral renal sinus cysts. Unremarkable bladder. Reproductive:No pathologic findings. Stomach/Bowel: Dilated and fluid-filled proximal small bowel with normalization via the mid jejunum at the level of the left lower quadrant. No discrete transition point is identified. There is no evidence of mass or hernia. Changes may reflect adhesive disease, which is seen in the right lower quadrant where ileal loops are abnormally close to the distorted cecum. No appendicitis or other bowel inflammation. Status post sigmoidectomy with patent anastomosis. Vascular/Lymphatic: No acute vascular abnormality. No mass or adenopathy. Peritoneal: No ascites or pneumoperitoneum. Musculoskeletal: No acute abnormalities. IMPRESSION: 1. Partial small bowel obstruction with transition in the left lower quadrant. No discrete cause is identified. 2. Adhesive changes noted in the right lower quadrant but without regional obstruction. Electronically Signed   By: Marnee Spring M.D.   On: 04/25/2015 06:11   Dg Abd Acute W/chest  04/20/2015  CLINICAL DATA:  Abdominal pain bloating and vomiting 2 nights ago; history of previous diverticular abscess with surgery in February and May 2016 EXAM: DG ABDOMEN ACUTE W/ 1V CHEST COMPARISON:  PA and lateral chest x-ray of Sep 22, 2014 and supine abdominal film of July 03, 2014. FINDINGS: Chest x-ray: The lungs are adequately inflated and clear. The heart and pulmonary vascularity are normal. The mediastinum is normal in width. There is no pleural effusion. The  bony thorax exhibits no acute abnormality. Within the abdomen the stool burden within the colon is moderate. There is no small or large bowel obstructive pattern. No free extraluminal gas collections are observed. There are no abnormal soft tissue calcifications. There is mild multilevel degenerative disc disease of the lumbar spine. There are degenerative changes of both hips. IMPRESSION: 1. There is no active cardiopulmonary disease. 2. No evidence of small or large bowel obstruction. Moderately increased colonic stool burden may reflect constipation in the appropriate clinical setting. If the patient's abdominal symptoms persist and remain unexplained, CT scanning would be useful next imaging  step. Electronically Signed   By: David  [place] M.D.   On: 04/20/2015 11:40   Dg Abd Portable 1v-small Bowel Obstruction Protocol-initial, 8 Hr Delay  04/26/2015  CLINICAL DATA:  74 year old female with small bowel obstruction. Follow-up delayed images. EXAM: PORTABLE ABDOMEN - 1 VIEW COMPARISON:  Radiograph dated 04/25/2015 at 2:33 p.m. FINDINGS: An enteric tube is noted with tip in the right hemiabdomen overlying the right L4 pedicle. Oral contrast is noted in the distal loops of small bowel and within the colon. There is no evidence of bowel obstruction. No free air. Contrast is noted within the urinary bladder. There is degenerative changes of spine. IMPRESSION: Oral contrast traverses into the colon. No evidence of bowel obstruction. Electronically Signed   By: Elgie Collard M.D.   On: 04/26/2015 02:52   Dg Abd Portable 1v-small Bowel Protocol-position Verification  04/25/2015  CLINICAL DATA:  Small bowel obstruction EXAM: PORTABLE ABDOMEN - 1 VIEW COMPARISON:  April 25, 2015 CT abdomen pelvis FINDINGS: The bowel gas pattern is normal. A feeding tube is identified with distal tip probably in the proximal duodenum. No radio-opaque calculi or other significant radiographic abnormality are seen. Residual  contrast is identified in the bladder. IMPRESSION: No plain film evidence of small bowel obstruction. A feeding tube is identified distal tip probably in the proximal duodenum. Electronically Signed   By: Sherian Rein M.D.   On: 04/25/2015 15:00    Microbiology: No results found for this or any previous visit (from the past 240 hour(s)).   Labs: Basic Metabolic Panel:  Recent Labs Lab 04/25/15 0400 04/25/15 0524 04/26/15 0500 04/27/15 0607  NA 137 141 143 141  K 6.2* 4.3 3.6 4.1  CL 99* 99* 107 108  CO2 26  --  27 23  GLUCOSE 153* 134* 87 62*  BUN 14 16 8 8   CREATININE 0.77 0.70 0.69 0.70  CALCIUM 9.9  --  8.5* 8.6*   Liver Function Tests:  Recent Labs Lab 04/25/15 0400  AST 73*  ALT 13*  ALKPHOS 88  BILITOT 1.7*  PROT 6.5  ALBUMIN 4.0    Recent Labs Lab 04/25/15 0400  LIPASE 65*   No results for input(s): AMMONIA in the last 168 hours. CBC:  Recent Labs Lab 04/25/15 0400  04/25/15 0817 04/25/15 1528 04/26/15 0004 04/26/15 0500 04/27/15 0607  WBC 9.8  --  11.3* 12.6* 9.2 8.4 10.2  NEUTROABS 6.9  --   --   --   --   --   --   HGB 14.1  < > 14.4 13.9 12.7 12.3 12.0  HCT 43.5  < > 43.5 42.9 40.0 38.8 37.0  MCV 90.2  --  89.7 89.6 90.9 91.5 91.6  PLT 349  --  305 336 313 297 279  < > = values in this interval not displayed. Cardiac Enzymes: No results for input(s): CKTOTAL, CKMB, CKMBINDEX, TROPONINI in the last 168 hours. BNP: BNP (last 3 results) No results for input(s): BNP in the last 8760 hours.  ProBNP (last 3 results) No results for input(s): PROBNP in the last 8760 hours.  CBG: No results for input(s): GLUCAP in the last 168 hours.     Signed:  Jeralyn Bennett MD  FACP  [facility] 04/27/2015, 11:22 AM

## 2015-04-27 NOTE — Telephone Encounter (Signed)
I spoke with husband; patient getting ready to leave the hospital hopefully today; I left word with husband and we'll see her for f/u here, have a nice holiday ----------------------- Patient records reviewed; will put in orders for the CBC to be done in-house when she comes for f/u Ordered and released

## 2015-04-27 NOTE — Progress Notes (Signed)
[facility] Progress Note     Subjective: Doing well, no n/v, abdominal pain nearly resolved, much less distension.  Ambulating well.  Had several good BM's and good flatus.  Hungry/thirsty.  Wants to go home today.  She says her ostomy site hurts her from time to time.    Objective: Vital signs in last 24 hours: Temp:  [97.7 F (36.5 C)-98.8 F (37.1 C)] 98.8 F (37.1 C) (12/23 0427) Pulse Rate:  [76-96] 91 (12/23 0427) Resp:  [17-18] 17 (12/23 0427) BP: (145-157)/(55-67) 145/55 mmHg (12/23 0427) SpO2:  [97 %-98 %] 97 % (12/23 0427) Last BM Date: 04/26/15  Intake/Output from previous day: 12/22 0701 - 12/23 0700 In: 2278.3 [P.O.:60; I.V.:1518.3] Out: 1200 [Urine:1150; Emesis/NG output:50] Intake/Output this shift:    PE: Gen:  Alert, NAD, pleasant Abd: Soft, minimal distension, NT, +BS, no HSM, abdominal scars noted   Lab Results:   Recent Labs  04/26/15 0500 04/27/15 0607  WBC 8.4 10.2  HGB 12.3 12.0  HCT 38.8 37.0  PLT 297 279   BMET  Recent Labs  04/26/15 0500 04/27/15 0607  NA 143 141  K 3.6 4.1  CL 107 108  CO2 27 23  GLUCOSE 87 62*  BUN 8 8  CREATININE 0.69 0.70  CALCIUM 8.5* 8.6*   PT/INR No results for input(s): LABPROT, INR in the last 72 hours. CMP     Component Value Date/Time   NA 141 04/27/2015 0607   NA 143 04/20/2015 0950   K 4.1 04/27/2015 0607   CL 108 04/27/2015 0607   CO2 23 04/27/2015 0607   GLUCOSE 62* 04/27/2015 0607   GLUCOSE 88 04/20/2015 0950   BUN 8 04/27/2015 0607   BUN 14 04/20/2015 0950   CREATININE 0.70 04/27/2015 0607   CALCIUM 8.6* 04/27/2015 0607   PROT 6.5 04/25/2015 0400   PROT 6.8 04/20/2015 0950   ALBUMIN 4.0 04/25/2015 0400   ALBUMIN 4.0 04/20/2015 0950   AST 73* 04/25/2015 0400   ALT 13* 04/25/2015 0400   ALKPHOS 88 04/25/2015 0400   BILITOT 1.7* 04/25/2015 0400   BILITOT 0.4 04/20/2015 0950   GFRNONAA >60 04/27/2015 0607   GFRAA >60 04/27/2015 0607   Lipase     Component Value  Date/Time   LIPASE 65* 04/25/2015 0400       Studies/Results: Abd 1 View (kub)  04/26/2015  CLINICAL DATA:  Small bowel obstruction. EXAM: ABDOMEN - 1 VIEW COMPARISON:  04/25/2015 FINDINGS: Enteric tube remains in place with tip likely in the third portion of the duodenum. Oral contrast has progressed distally from the prior study with at most only a small amount of contrast remaining in small bowel. Large bowel contrast is present to the level of the descending colon. No bowel dilatation suggestive of obstruction is identified. Suture material is noted in the pelvis. Lumbar spondylosis is noted. IMPRESSION: Progressing oral contrast material without current evidence of obstruction. Electronically Signed   By: Sebastian Ache M.D.   On: 04/26/2015 08:02   Dg Abd Portable 1v-small Bowel Obstruction Protocol-initial, 8 Hr Delay  04/26/2015  CLINICAL DATA:  74 year old female with small bowel obstruction. Follow-up delayed images. EXAM: PORTABLE ABDOMEN - 1 VIEW COMPARISON:  Radiograph dated 04/25/2015 at 2:33 p.m. FINDINGS: An enteric tube is noted with tip in the right hemiabdomen overlying the right L4 pedicle. Oral contrast is noted in the distal loops of small bowel and within the colon. There is no evidence of bowel obstruction. No free air. Contrast is  noted within the urinary bladder. There is degenerative changes of spine. IMPRESSION: Oral contrast traverses into the colon. No evidence of bowel obstruction. Electronically Signed   By: Elgie Collard M.D.   On: 04/26/2015 02:52   Dg Abd Portable 1v-small Bowel Protocol-position Verification  04/25/2015  CLINICAL DATA:  Small bowel obstruction EXAM: PORTABLE ABDOMEN - 1 VIEW COMPARISON:  April 25, 2015 CT abdomen pelvis FINDINGS: The bowel gas pattern is normal. A feeding tube is identified with distal tip probably in the proximal duodenum. No radio-opaque calculi or other significant radiographic abnormality are seen. Residual contrast is  identified in the bladder. IMPRESSION: No plain film evidence of small bowel obstruction. A feeding tube is identified distal tip probably in the proximal duodenum. Electronically Signed   By: Sherian Rein M.D.   On: 04/25/2015 15:00    Anti-infectives: Anti-infectives    None       Assessment/Plan pSBO -Clamping trial went well, will d/c and start on clears, advance to soft at lunch -Ambulate and IS -SCD's and lovenox or heparin okay -D/c home later this afternoon if tolerating soft diet -Discussed soft/puree diet at home for 1 week.  May need stool softeners.  Stay hydrated.  Ambulate frequently.  Eat smaller more frequent meals. -Discussed massage of scar with vitamin E oil    LOS: 2 days    Nonie Hoyer 04/27/2015, 7:29 AM Pager: [phone]

## 2015-05-01 ENCOUNTER — Telehealth: Payer: Self-pay | Admitting: Family Medicine

## 2015-05-01 NOTE — Telephone Encounter (Signed)
Pt is experienced blockage last week and would like a call back for advice so it doesn't happen again.

## 2015-05-01 NOTE — Telephone Encounter (Signed)
I spoke with patient; she was in the hospital; they put the tube down her nose, filled up the container, did CT scan and three xrays; felt like the blockage was opening; she has loose BMs, they would not let her go until she kept her lunch down; she thought she was doing fine, got out Friday evening; then started to get worried on Sunday, no BMs since then; took Miralax and that didn't help; took more last night; getting bloated again, getting chilled; still feels like she has infection, wakes up sweating; gave herself an enema last night, up 4-5 x a passed little chunks and a little bit came out; had some stew for Christmas; I told her that I was worried about her and want her to go to the ER, discussed risk of bowel perforation, PSBO; she says she might wait to see how it does later this afternoon; I told her that my official recommendation is to go to the ER; she is certainly welcome to call her surgeon who saw her in the hospital this last stay to see if he has a difference of opinion, but my official recommendation is to go to the ER; she thanked me for the call

## 2015-05-01 NOTE — Telephone Encounter (Signed)
She did go to the ER for her constipation as recommended but yesterday she started feeling bloated again, gave herself an enema. Overnight, she's passed some liquids and some brown stuff but no full bowel movement. She wants to know what else can she try at home so she doesn't have to go back to the ER. She does take Flax Seed and Activia yogurt daily.

## 2015-05-15 ENCOUNTER — Encounter: Payer: Self-pay | Admitting: Family Medicine

## 2015-05-15 ENCOUNTER — Ambulatory Visit (INDEPENDENT_AMBULATORY_CARE_PROVIDER_SITE_OTHER): Payer: PPO | Admitting: Family Medicine

## 2015-05-15 ENCOUNTER — Ambulatory Visit: Payer: PPO | Admitting: Family Medicine

## 2015-05-15 VITALS — BP 164/68 | HR 57 | Temp 97.2°F | Wt 121.0 lb

## 2015-05-15 DIAGNOSIS — K5669 Other intestinal obstruction: Secondary | ICD-10-CM

## 2015-05-15 DIAGNOSIS — K56600 Partial intestinal obstruction, unspecified as to cause: Secondary | ICD-10-CM

## 2015-05-15 DIAGNOSIS — I10 Essential (primary) hypertension: Secondary | ICD-10-CM

## 2015-05-15 DIAGNOSIS — E785 Hyperlipidemia, unspecified: Secondary | ICD-10-CM | POA: Diagnosis not present

## 2015-05-15 DIAGNOSIS — E876 Hypokalemia: Secondary | ICD-10-CM

## 2015-05-15 MED ORDER — EZETIMIBE 10 MG PO TABS: 10.0000 mg | ORAL_TABLET | Freq: Every day | ORAL | Status: DC

## 2015-05-15 NOTE — Assessment & Plan Note (Signed)
Resolved essentially, she knows what signs and symptoms to look for and she'll go to the hospital if returns

## 2015-05-15 NOTE — Patient Instructions (Addendum)
Do try to limit eggs and butter and mayonnaise, extra saturated fats, especially from animals Do consider starting the Zetia, and you can check your records to see if it's something you've already tried before; if so, then call me back and we'll think of other things Let's check your fasting cholesterol in 3 months Your goal blood pressure is less than 150 mmHg on top. Try to follow the DASH guidelines (DASH stands for Dietary Approaches to Stop Hypertension) Try to limit the sodium in your diet.  Ideally, consume less than 1.5 grams (less than 1,500mg ) per day. Do not add salt when cooking or at the table.  Check the sodium amount on labels when shopping, and choose items lower in sodium when given a choice. Avoid or limit foods that already contain a lot of sodium. While I would love you to eat a diet rich in fruits and vegetables and whole grains, we are both aware that your low residue diet takes precedence over this. Try to adapt or modify the DASH diet where you can to help lower your blood pressure.

## 2015-05-15 NOTE — Progress Notes (Signed)
BP 164/68 mmHg  Pulse 57  Temp(Src) 97.2 F (36.2 C)  Wt 121 lb (54.885 kg)  SpO2 100%   Subjective:    Patient ID: Yvonne Espinoza, female    DOB: 1940/12/20, 74 y.o.   MRN: [id number]  HPI: Yvonne Espinoza is a 75 y.o. female  Chief Complaint  Patient presents with  . Hyperlipidemia    "I think we were going to talk about my cholesterol"   She is having bowel movements every day; no diarrhea; not really having any abdominal pain, no fevers; no nausea or vomiting; she talked with Dr. Dixon Boos nurse; will take a year or more to come back after that big surgery last February, reversal in May She is wearing heavy clothes today (cold weather)  High cholesterol; has had high cholesterol for several years; not taking any medicine, and she has tried several different kinds in the past; she saw cardiologist and she had a stress test and he said she needed to be on a statin, he started another and she had to stop from too many side effects; she thinks she is eating better, but now has to be on a low fiber diet, it's the oppositie of what she should eat; was eating raw vegetables which is not on the lower fiber diet; now no whole grain bread, just white bread; uses olive oil; has eggs three times a week; not much mayonnaise or ice cream  High blood pressure; not using much salt at all; she has always had good blood pressure; she has a way to check it at home; never taken medicine for BP and would prefer not to; usually 120/80  She has some esophageal reflux; controlled with the medicine; avoiding hot peppers; not drinking coffee; occasional chocolate, no flares with that; peanut butter on two pieces of toast is a trigger, but only one piece is tolerated  Relevant past medical, surgical, family and social history reviewed and updated as indicated. Interim medical history since our last visit reviewed. Allergies and medications reviewed and updated.  Review of Systems Per HPI unless  specifically indicated above     Objective:    BP 164/68 mmHg  Pulse 57  Temp(Src) 97.2 F (36.2 C)  Wt 121 lb (54.885 kg)  SpO2 100%  Wt Readings from Last 3 Encounters:  05/15/15 121 lb (54.885 kg)  04/25/15 117 lb 15.8 oz (53.52 kg)  04/20/15 118 lb (53.524 kg)    Today's Vitals   05/15/15 1123 05/15/15 1203  BP: 163/60 164/68  Pulse: 58 57  Temp: 97.2 F (36.2 C)   Weight: 121 lb (54.885 kg)   SpO2: 100%     Physical Exam  Constitutional: She appears well-developed and well-nourished.  Weight gain 4 pounds over last two weeks  Eyes: No scleral icterus.  Neck: No JVD present.  Cardiovascular: Normal rate and regular rhythm.   Pulmonary/Chest: Effort normal and breath sounds normal.  Abdominal: Soft. Bowel sounds are normal. She exhibits no distension.  Musculoskeletal: She exhibits no edema.  Neurological: She is alert. She displays no tremor.  Skin: No pallor.  Psychiatric: She has a normal mood and affect.   Results for orders placed or performed during the hospital encounter of 04/25/15  CBC with Differential/Platelet  Result Value Ref Range   WBC 9.8 4.0 - 10.5 K/uL   RBC 4.82 3.87 - 5.11 MIL/uL   Hemoglobin 14.1 12.0 - 15.0 g/dL   HCT 26.9 48.5 - 46.2 %   MCV  90.2 78.0 - 100.0 fL   MCH 29.3 26.0 - 34.0 pg   MCHC 32.4 30.0 - 36.0 g/dL   RDW 85.4 (H) 62.7 - 03.5 %   Platelets 349 150 - 400 K/uL   Neutrophils Relative % 70 %   Neutro Abs 6.9 1.7 - 7.7 K/uL   Lymphocytes Relative 18 %   Lymphs Abs 1.8 0.7 - 4.0 K/uL   Monocytes Relative 9 %   Monocytes Absolute 0.9 0.1 - 1.0 K/uL   Eosinophils Relative 2 %   Eosinophils Absolute 0.2 0.0 - 0.7 K/uL   Basophils Relative 1 %   Basophils Absolute 0.1 0.0 - 0.1 K/uL  Basic metabolic panel  Result Value Ref Range   Sodium 137 135 - 145 mmol/L   Potassium 6.2 (HH) 3.5 - 5.1 mmol/L   Chloride 99 (L) 101 - 111 mmol/L   CO2 26 22 - 32 mmol/L   Glucose, Bld 153 (H) 65 - 99 mg/dL   BUN 14 6 - 20 mg/dL    Creatinine, Ser 0.09 0.44 - 1.00 mg/dL   Calcium 9.9 8.9 - 38.1 mg/dL   GFR calc non Af Amer >60 >60 mL/min   GFR calc Af Amer >60 >60 mL/min   Anion gap 12 5 - 15  Lipase, blood  Result Value Ref Range   Lipase 65 (H) 11 - 51 U/L  Hepatic function panel  Result Value Ref Range   Total Protein 6.5 6.5 - 8.1 g/dL   Albumin 4.0 3.5 - 5.0 g/dL   AST 73 (H) 15 - 41 U/L   ALT 13 (L) 14 - 54 U/L   Alkaline Phosphatase 88 38 - 126 U/L   Total Bilirubin 1.7 (H) 0.3 - 1.2 mg/dL   Bilirubin, Direct 0.9 (H) 0.1 - 0.5 mg/dL   Indirect Bilirubin 0.8 0.3 - 0.9 mg/dL  CBC  Result Value Ref Range   WBC 11.3 (H) 4.0 - 10.5 K/uL   RBC 4.85 3.87 - 5.11 MIL/uL   Hemoglobin 14.4 12.0 - 15.0 g/dL   HCT 82.9 93.7 - 16.9 %   MCV 89.7 78.0 - 100.0 fL   MCH 29.7 26.0 - 34.0 pg   MCHC 33.1 30.0 - 36.0 g/dL   RDW 67.8 93.8 - 10.1 %   Platelets 305 150 - 400 K/uL  CBC  Result Value Ref Range   WBC 12.6 (H) 4.0 - 10.5 K/uL   RBC 4.79 3.87 - 5.11 MIL/uL   Hemoglobin 13.9 12.0 - 15.0 g/dL   HCT 75.1 02.5 - 85.2 %   MCV 89.6 78.0 - 100.0 fL   MCH 29.0 26.0 - 34.0 pg   MCHC 32.4 30.0 - 36.0 g/dL   RDW 77.8 24.2 - 35.3 %   Platelets 336 150 - 400 K/uL  TSH  Result Value Ref Range   TSH 1.353 0.350 - 4.500 uIU/mL  Occult blood gastric / duodenum  Result Value Ref Range   pH, Gastric NOT DONE    Occult Blood, Gastric POSITIVE (A) NEGATIVE  CBC  Result Value Ref Range   WBC 9.2 4.0 - 10.5 K/uL   RBC 4.40 3.87 - 5.11 MIL/uL   Hemoglobin 12.7 12.0 - 15.0 g/dL   HCT 61.4 43.1 - 54.0 %   MCV 90.9 78.0 - 100.0 fL   MCH 28.9 26.0 - 34.0 pg   MCHC 31.8 30.0 - 36.0 g/dL   RDW 08.6 (H) 76.1 - 95.0 %   Platelets 313 150 - 400 K/uL  Basic metabolic panel  Result Value Ref Range   Sodium 143 135 - 145 mmol/L   Potassium 3.6 3.5 - 5.1 mmol/L   Chloride 107 101 - 111 mmol/L   CO2 27 22 - 32 mmol/L   Glucose, Bld 87 65 - 99 mg/dL   BUN 8 6 - 20 mg/dL   Creatinine, Ser 2.29 0.44 - 1.00 mg/dL   Calcium  8.5 (L) 8.9 - 10.3 mg/dL   GFR calc non Af Amer >60 >60 mL/min   GFR calc Af Amer >60 >60 mL/min   Anion gap 9 5 - 15  CBC  Result Value Ref Range   WBC 8.4 4.0 - 10.5 K/uL   RBC 4.24 3.87 - 5.11 MIL/uL   Hemoglobin 12.3 12.0 - 15.0 g/dL   HCT 79.8 92.1 - 19.4 %   MCV 91.5 78.0 - 100.0 fL   MCH 29.0 26.0 - 34.0 pg   MCHC 31.7 30.0 - 36.0 g/dL   RDW 17.4 (H) 08.1 - 44.8 %   Platelets 297 150 - 400 K/uL  CBC  Result Value Ref Range   WBC 10.2 4.0 - 10.5 K/uL   RBC 4.04 3.87 - 5.11 MIL/uL   Hemoglobin 12.0 12.0 - 15.0 g/dL   HCT 18.5 63.1 - 49.7 %   MCV 91.6 78.0 - 100.0 fL   MCH 29.7 26.0 - 34.0 pg   MCHC 32.4 30.0 - 36.0 g/dL   RDW 02.6 (H) 37.8 - 58.8 %   Platelets 279 150 - 400 K/uL  Basic metabolic panel  Result Value Ref Range   Sodium 141 135 - 145 mmol/L   Potassium 4.1 3.5 - 5.1 mmol/L   Chloride 108 101 - 111 mmol/L   CO2 23 22 - 32 mmol/L   Glucose, Bld 62 (L) 65 - 99 mg/dL   BUN 8 6 - 20 mg/dL   Creatinine, Ser 5.02 0.44 - 1.00 mg/dL   Calcium 8.6 (L) 8.9 - 10.3 mg/dL   GFR calc non Af Amer >60 >60 mL/min   GFR calc Af Amer >60 >60 mL/min   Anion gap 10 5 - 15  POC occult blood, ED  Result Value Ref Range   Fecal Occult Bld NEGATIVE NEGATIVE  I-stat chem 8, ed  Result Value Ref Range   Sodium 141 135 - 145 mmol/L   Potassium 4.3 3.5 - 5.1 mmol/L   Chloride 99 (L) 101 - 111 mmol/L   BUN 16 6 - 20 mg/dL   Creatinine, Ser 7.74 0.44 - 1.00 mg/dL   Glucose, Bld 128 (H) 65 - 99 mg/dL   Calcium, Ion 7.86 7.67 - 1.30 mmol/L   TCO2 31 0 - 100 mmol/L   Hemoglobin 16.7 (H) 12.0 - 15.0 g/dL   HCT 20.9 (H) 47.0 - 96.2 %  Type and screen  Result Value Ref Range   ABO/RH(D) O POS    Antibody Screen NEG    Sample Expiration 04/28/2015       Assessment & Plan:   Problem List Items Addressed This Visit      Cardiovascular and Mediastinum   Essential hypertension, benign    DASH guidelines encouraged; see AVS      Relevant Medications   ezetimibe (ZETIA)  10 MG tablet     Digestive   Partial small bowel obstruction (HCC)    Resolved essentially, she knows what signs and symptoms to look for and she'll go to the hospital if returns  Other   Hyperlipidemia - Primary (Chronic)    Discussed dietary challenges she faces with her bowel issues and trying to decrease saturated fats; return in April and we'll check fasting labs at that visit      Relevant Medications   ezetimibe (ZETIA) 10 MG tablet   Hypokalemia    We reviewed several of her labs from hospital records; she had one high K+ actually but that appeared to be hemolysis; he rmost recent K+ was normal; suspect previous low K+ was from bowel loss          Follow up plan: Return in about 3 months (around 08/13/2015) for thirty minute follow-up with fasting labs.  An after-visit summary was printed and given to the patient at check-out.  Please see the patient instructions which may contain other information and recommendations beyond what is mentioned above in the assessment and plan.  Face-to-face time with patient was more than 25 minutes, >50% time spent counseling and coordination of care

## 2015-05-18 DIAGNOSIS — I10 Essential (primary) hypertension: Secondary | ICD-10-CM | POA: Insufficient documentation

## 2015-05-18 NOTE — Assessment & Plan Note (Signed)
We reviewed several of her labs from hospital records; she had one high K+ actually but that appeared to be hemolysis; he rmost recent K+ was normal; suspect previous low K+ was from bowel loss

## 2015-05-18 NOTE — Assessment & Plan Note (Signed)
Discussed dietary challenges she faces with her bowel issues and trying to decrease saturated fats; return in April and we'll check fasting labs at that visit

## 2015-05-18 NOTE — Assessment & Plan Note (Signed)
DASH guidelines encouraged; see AVS

## 2015-06-11 ENCOUNTER — Other Ambulatory Visit: Payer: Self-pay | Admitting: Family Medicine

## 2015-06-12 NOTE — Telephone Encounter (Signed)
rx sent

## 2015-06-28 ENCOUNTER — Ambulatory Visit (INDEPENDENT_AMBULATORY_CARE_PROVIDER_SITE_OTHER): Payer: PPO | Admitting: Family Medicine

## 2015-06-28 ENCOUNTER — Encounter: Payer: Self-pay | Admitting: Family Medicine

## 2015-06-28 VITALS — BP 133/85 | HR 70 | Temp 97.8°F | Ht 59.18 in | Wt 123.0 lb

## 2015-06-28 DIAGNOSIS — R0789 Other chest pain: Secondary | ICD-10-CM

## 2015-06-28 DIAGNOSIS — D72829 Elevated white blood cell count, unspecified: Secondary | ICD-10-CM | POA: Diagnosis not present

## 2015-06-28 DIAGNOSIS — Z8673 Personal history of transient ischemic attack (TIA), and cerebral infarction without residual deficits: Secondary | ICD-10-CM

## 2015-06-28 DIAGNOSIS — E785 Hyperlipidemia, unspecified: Secondary | ICD-10-CM | POA: Diagnosis not present

## 2015-06-28 DIAGNOSIS — R748 Abnormal levels of other serum enzymes: Secondary | ICD-10-CM | POA: Diagnosis not present

## 2015-06-28 MED ORDER — VALACYCLOVIR HCL 1 G PO TABS: 1000.0000 mg | ORAL_TABLET | Freq: Three times a day (TID) | ORAL | Status: AC

## 2015-06-28 NOTE — Assessment & Plan Note (Signed)
Resolved last check 

## 2015-06-28 NOTE — Assessment & Plan Note (Signed)
Check lipids today (fasting) 

## 2015-06-28 NOTE — Assessment & Plan Note (Signed)
Taking aspirin 

## 2015-06-28 NOTE — Progress Notes (Signed)
BP 133/85 mmHg  Pulse 70  Temp(Src) 97.8 F (36.6 C)  Ht 4' 11.18" (1.503 m)  Wt 123 lb (55.792 kg)  BMI 24.70 kg/m2  SpO2 99%   Subjective:    Patient ID: Yvonne Espinoza, female    DOB: 05-16-1940, 75 y.o.   MRN: [id number]  HPI: Yvonne Espinoza is a 75 y.o. female  Chief Complaint  Patient presents with  . Side Pain    Patient said that her pain goes from lower shoulder (left side) then down across to under her breast. She thought she pulled a muscle doing work outside, but it's been going on x's 2 wks.   . Hyperlipidemia    Wondering if she could get her cholesterol check today, she hasn't ate since 8:00am this morning.   . Medication Refill   Going on two weeks; almost constant, going on every day for two weeks; feels like something going through into the left breast into the nipple area; had been raking leaves; can't feel a lump; no nipple discharge, no lymph nodes under arm; last mammogram was two years ago, next mammo March 17th; no SHOB or nausea; sometimes hurts and holding the breast up and giving some support gives some relief; no movement of the arm affects it   No rash other than a small area on the left flank, just one spot; went to the skin doctor, two creams to mix together, dries up and then comes back on the right hand; other lesion on the left flank and comes and goes; can be itchy at times, pops out; does not think the rash is related to the breast pain  We reviewed last labs; she would like cholesterol checked  Saw urologist since last visit and had scan and had cysts on her kidneys Has not seen surgeon since the hospital; she called him last time; he told her to do a low fiber diet; using miralax; trying to avoid constipation; feels like she has a pocket in there and it catches; she knows they redid bowel and colon  Relevant past medical, surgical, family and social history reviewed and updated as indicated. Father had a heart attack, had diabetes, didn't  take care of hiimself  nterim medical history since our last visit reviewed. Allergies and medications reviewed and updated.  Review of Systems  Constitutional: Positive for diaphoresis (throws cover off at night; feels hot; going on a while, not every night). Negative for fever.  HENT: Positive for postnasal drip. Negative for nosebleeds.   Respiratory: Negative for cough and wheezing.        Little dry catch in the throat; no problems swallowing  Cardiovascular: Positive for chest pain (left side in breast). Negative for leg swelling.  Gastrointestinal: Positive for abdominal distention (seems to always have a pouch on where they operated; adhesions; sees surgeon). Negative for diarrhea and blood in stool.  Musculoskeletal: Positive for arthralgias (right thumb).  Neurological: Negative for tremors and numbness.  Hematological: Bruises/bleeds easily (bruises easier than before).  Per HPI unless specifically indicated above     Objective:    BP 133/85 mmHg  Pulse 70  Temp(Src) 97.8 F (36.6 C)  Ht 4' 11.18" (1.503 m)  Wt 123 lb (55.792 kg)  BMI 24.70 kg/m2  SpO2 99%  Wt Readings from Last 3 Encounters:  06/28/15 123 lb (55.792 kg)  05/15/15 121 lb (54.885 kg)  04/25/15 117 lb 15.8 oz (53.52 kg)    Physical Exam  Constitutional: She appears well-developed  and well-nourished.  Cardiovascular: Normal rate and regular rhythm.   Psychiatric: She has a normal mood and affect.      Assessment & Plan:   Problem List Items Addressed This Visit      Other   Hyperlipidemia (Chronic)    Check lipids today; fasting      Relevant Orders   Lipid Panel w/o Chol/HDL Ratio (Completed)   Hx-TIA (transient ischemic attack) (Chronic)    Taking aspirin      Leukocytosis    Resolved last check      Relevant Orders   CBC with Differential/Platelet (Completed)   Left-sided chest wall pain - Primary    Nothing on exam to suggest breast/axillary pathology; EKG reviewed; I actually  suspect this is shingles-type pain; will have her start valacyclovir and return for close f/u; discussed reasons to seek medical attention      Relevant Orders   EKG 12-Lead (Completed)    Other Visit Diagnoses    Abnormal liver enzymes        Relevant Orders    Comprehensive metabolic panel (Completed)       Follow up plan: Return 7-10 days, for follow-up.  Orders Placed This Encounter  Procedures  . Lipid Panel w/o Chol/HDL Ratio  . Comprehensive metabolic panel  . CBC with Differential/Platelet  . EKG 12-Lead   An after-visit summary was printed and given to the patient at check-out.  Please see the patient instructions which may contain other information and recommendations beyond what is mentioned above in the assessment and plan.  Meds ordered this encounter  Medications  . valACYclovir (VALTREX) 1000 MG tablet    Sig: Take 1 tablet (1,000 mg total) by mouth 3 (three) times daily.    Dispense:  21 tablet    Refill:  0

## 2015-06-28 NOTE — Patient Instructions (Addendum)
I'll be moving to another practice, [facility], on August 06, 2015 I'll be here until August 03, 2015 You are welcome to either follow me there for ongoing care, or you can stay here at [facility] and see Dr. Olevia Perches or our nurse practitioner Gabriel Cirri  If you do take aleve or ibuprofen or any other NSAID, make sure to take the aspirin at least one hour prior to the NSAID  If this area does not get any better, let me know  Return in 7-10 days  Start the new medicine

## 2015-06-29 LAB — COMPREHENSIVE METABOLIC PANEL
ALT: 17 IU/L (ref 0–32)
AST: 19 IU/L (ref 0–40)
Albumin/Globulin Ratio: 1.7 (ref 1.1–2.5)
Albumin: 4 g/dL (ref 3.5–4.8)
Alkaline Phosphatase: 77 IU/L (ref 39–117)
BUN/Creatinine Ratio: 20 (ref 11–26)
BUN: 13 mg/dL (ref 8–27)
Bilirubin Total: 0.2 mg/dL (ref 0.0–1.2)
CALCIUM: 9.4 mg/dL (ref 8.7–10.3)
CO2: 26 mmol/L (ref 18–29)
Chloride: 101 mmol/L (ref 96–106)
Creatinine, Ser: 0.65 mg/dL (ref 0.57–1.00)
GFR, EST AFRICAN AMERICAN: 102 mL/min/{1.73_m2} (ref >59)
GFR, EST NON AFRICAN AMERICAN: 88 mL/min/{1.73_m2} (ref >59)
GLUCOSE: 85 mg/dL (ref 65–99)
Globulin, Total: 2.3 g/dL (ref 1.5–4.5)
Potassium: 4.7 mmol/L (ref 3.5–5.2)
Sodium: 142 mmol/L (ref 134–144)
TOTAL PROTEIN: 6.3 g/dL (ref 6.0–8.5)

## 2015-06-29 LAB — CBC WITH DIFFERENTIAL/PLATELET
BASOS ABS: 0 10*3/uL (ref 0.0–0.2)
BASOS: 1 %
EOS (ABSOLUTE): 0.4 10*3/uL (ref 0.0–0.4)
Eos: 4 %
HEMOGLOBIN: 13 g/dL (ref 11.1–15.9)
Hematocrit: 40.1 % (ref 34.0–46.6)
IMMATURE GRANS (ABS): 0 10*3/uL (ref 0.0–0.1)
Immature Granulocytes: 0 %
LYMPHS: 35 %
Lymphocytes Absolute: 2.8 10*3/uL (ref 0.7–3.1)
MCH: 29 pg (ref 26.6–33.0)
MCHC: 32.4 g/dL (ref 31.5–35.7)
MCV: 89 fL (ref 79–97)
MONOCYTES: 10 %
Monocytes Absolute: 0.8 10*3/uL (ref 0.1–0.9)
NEUTROS ABS: 4 10*3/uL (ref 1.4–7.0)
Neutrophils: 50 %
Platelets: 337 10*3/uL (ref 150–379)
RBC: 4.49 x10E6/uL (ref 3.77–5.28)
RDW: 14.9 % (ref 12.3–15.4)
WBC: 7.9 10*3/uL (ref 3.4–10.8)

## 2015-06-29 LAB — LIPID PANEL W/O CHOL/HDL RATIO
CHOLESTEROL TOTAL: 219 mg/dL — AB (ref 100–199)
HDL: 59 mg/dL (ref >39)
LDL CALC: 139 mg/dL — AB (ref 0–99)
Triglycerides: 107 mg/dL (ref 0–149)
VLDL Cholesterol Cal: 21 mg/dL (ref 5–40)

## 2015-07-10 NOTE — Assessment & Plan Note (Signed)
Nothing on exam to suggest breast/axillary pathology; EKG reviewed; I actually suspect this is shingles-type pain; will have her start valacyclovir and return for close f/u; discussed reasons to seek medical attention

## 2015-07-11 ENCOUNTER — Encounter: Payer: Self-pay | Admitting: Family Medicine

## 2015-07-11 ENCOUNTER — Ambulatory Visit (INDEPENDENT_AMBULATORY_CARE_PROVIDER_SITE_OTHER): Payer: PPO | Admitting: Family Medicine

## 2015-07-11 VITALS — BP 115/66 | HR 82 | Temp 98.0°F | Wt 121.0 lb

## 2015-07-11 DIAGNOSIS — R079 Chest pain, unspecified: Secondary | ICD-10-CM

## 2015-07-11 DIAGNOSIS — E785 Hyperlipidemia, unspecified: Secondary | ICD-10-CM | POA: Diagnosis not present

## 2015-07-11 DIAGNOSIS — R0789 Other chest pain: Secondary | ICD-10-CM | POA: Diagnosis not present

## 2015-07-11 MED ORDER — TRIAMCINOLONE ACETONIDE 0.1 % EX CREA: 1.0000 "application " | TOPICAL_CREAM | Freq: Two times a day (BID) | CUTANEOUS | Status: DC

## 2015-07-11 MED ORDER — EZETIMIBE 10 MG PO TABS: 10.0000 mg | ORAL_TABLET | Freq: Every day | ORAL | Status: DC

## 2015-07-11 NOTE — Assessment & Plan Note (Addendum)
Patient reports years of symptoms; has seen two cardiologist; will refer back to Dr. Gwen Pounds; continue aspirin; call 911 if needed

## 2015-07-11 NOTE — Assessment & Plan Note (Signed)
Patient does not tolerate statins; start back on Zetia; recheck lipids at f/u in 2 months

## 2015-07-11 NOTE — Patient Instructions (Addendum)
I've put in a referral to Dr. Gwen Pounds If you have not heard anything from my staff in a week about any orders/referrals/studies from today, please contact us here to follow-up [phone]  I'll be moving to another practice, [facility], on August 06, 2015 I'll be here until August 03, 2015 You are welcome to either follow me there for ongoing care, or you can stay here at [facility] and see Dr. Olevia Perches or our nurse practitioner Gabriel Cirri  Do stay well-hydrated  Return for a pneumonia vaccine PCV-13 in about 2 months  Try to limit saturated fats in your diet (bologna, hot dogs, barbeque, cheeseburgers, hamburgers, steak, bacon, sausage, cheese, etc.) and get more fresh fruits, vegetables, and whole grains Start back on Zetia; recheck cholesterol at follow-up

## 2015-07-11 NOTE — Progress Notes (Signed)
BP 115/66 mmHg  Pulse 82  Temp(Src) 98 F (36.7 C)  Wt 121 lb (54.885 kg)  SpO2 97%   Subjective:    Patient ID: Yvonne Espinoza, female    DOB: [birth year], 75 y.o.   MRN: [id number]  HPI: Yvonne Espinoza is a 75 y.o. female  Chief Complaint  Patient presents with  . Follow-up    follow up on chest wall pain. She states it is much better than it was. The itching has also improved.  . Medication Refill    she would like a refill on Triamcinolone actetonide cream 0.1%. She had gotten it from her dermatologist in the past.  . Immunizations    She is interested in the Prevnar vaccine. Is it ok to give since she had a shingles like episode?    She says that the symptoms are almost gone, itching is better; just a few blisters broke out; muscle cramping on the side when she bends forward, but that's not new; she does not know what that's from; she has had heart exam, Dr. Jacinto Halim thought musculoskeletal; I offered her to go back to see a heart doctor again; last check was about 5 years ago; saw Dr. Gwen Pounds 3-4 years ago  She would like refill of TAC cream; the dermatologist told her to mix with OTC cream, mixed it 1:1 and applied to sensitive skin; not on face; using on the left side for now  She has had the PPSV-23 vaccine, but not the PCV-13; she had shingles vaccine  BP is running lower than normal for her; not really dizzy, just a little light-headed if bending over; not on any BP medicines; in the low 100s over 50-60; usually 120/80; she drinks a lot of hot tea, both decaf and regular  Relevant past medical reviewed Allergies and medications reviewed and updated.  Review of Systems  Per HPI unless specifically indicated above     Objective:    BP 115/66 mmHg  Pulse 82  Temp(Src) 98 F (36.7 C)  Wt 121 lb (54.885 kg)  SpO2 97%  Wt Readings from Last 3 Encounters:  07/11/15 121 lb (54.885 kg)  06/28/15 123 lb (55.792 kg)  05/15/15 121 lb (54.885 kg)    Physical  Exam  Constitutional: She appears well-developed and well-nourished.  Cardiovascular: Normal rate and regular rhythm.   Pulmonary/Chest: Effort normal and breath sounds normal. She has no wheezes. She has no rhonchi. She has no rales.  Neurological: She displays no tremor. Gait normal.  Skin:  Solitary erythematous skin lesion lateral left thorax; no others seen  Psychiatric: She has a normal mood and affect.   Results for orders placed or performed in visit on 06/28/15  Lipid Panel w/o Chol/HDL Ratio  Result Value Ref Range   Cholesterol, Total 219 (H) 100 - 199 mg/dL   Triglycerides 258 0 - 149 mg/dL   HDL 59 >52 mg/dL   VLDL Cholesterol Cal 21 5 - 40 mg/dL   LDL Calculated 778 (H) 0 - 99 mg/dL  Comprehensive metabolic panel  Result Value Ref Range   Glucose 85 65 - 99 mg/dL   BUN 13 8 - 27 mg/dL   Creatinine, Ser 2.42 0.57 - 1.00 mg/dL   GFR calc non Af Amer 88 >59 mL/min/1.73   GFR calc Af Amer 102 >59 mL/min/1.73   BUN/Creatinine Ratio 20 11 - 26   Sodium 142 134 - 144 mmol/L   Potassium 4.7 3.5 - 5.2 mmol/L   Chloride  101 96 - 106 mmol/L   CO2 26 18 - 29 mmol/L   Calcium 9.4 8.7 - 10.3 mg/dL   Total Protein 6.3 6.0 - 8.5 g/dL   Albumin 4.0 3.5 - 4.8 g/dL   Globulin, Total 2.3 1.5 - 4.5 g/dL   Albumin/Globulin Ratio 1.7 1.1 - 2.5   Bilirubin Total 0.2 0.0 - 1.2 mg/dL   Alkaline Phosphatase 77 39 - 117 IU/L   AST 19 0 - 40 IU/L   ALT 17 0 - 32 IU/L  CBC with Differential/Platelet  Result Value Ref Range   WBC 7.9 3.4 - 10.8 x10E3/uL   RBC 4.49 3.77 - 5.28 x10E6/uL   Hemoglobin 13.0 11.1 - 15.9 g/dL   Hematocrit 02.5 42.7 - 46.6 %   MCV 89 79 - 97 fL   MCH 29.0 26.6 - 33.0 pg   MCHC 32.4 31.5 - 35.7 g/dL   RDW 06.2 37.6 - 28.3 %   Platelets 337 150 - 379 x10E3/uL   Neutrophils 50 %   Lymphs 35 %   Monocytes 10 %   Eos 4 %   Basos 1 %   Neutrophils Absolute 4.0 1.4 - 7.0 x10E3/uL   Lymphocytes Absolute 2.8 0.7 - 3.1 x10E3/uL   Monocytes Absolute 0.8 0.1 - 0.9  x10E3/uL   EOS (ABSOLUTE) 0.4 0.0 - 0.4 x10E3/uL   Basophils Absolute 0.0 0.0 - 0.2 x10E3/uL   Immature Granulocytes 0 %   Immature Grans (Abs) 0.0 0.0 - 0.1 x10E3/uL      Assessment & Plan:   Problem List Items Addressed This Visit      Other   Hyperlipidemia (Chronic)    Patient does not tolerate statins; start back on Zetia; recheck lipids at f/u in 2 months      Relevant Medications   ezetimibe (ZETIA) 10 MG tablet   Left-sided chest wall pain - Primary    I believe this is 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 Gwen Pounds; continue aspirin; call 911 if needed      Relevant Orders   Ambulatory referral to Cardiology      Follow up plan: Return in about 2 months (around 09/10/2015) for visit, PCV-13 vaccine.  An after-visit summary was printed and given to the patient at check-out.  Please see the patient instructions which may contain other information and recommendations beyond what is mentioned above in the assessment and plan.  Meds ordered this encounter  Medications  . triamcinolone cream (KENALOG) 0.1 %    Sig: Apply 1 application topically 2 (two) times daily. Mix one to one with gentle moisturizing lotion; as needed    Dispense:  45 g    Refill:  1  . ezetimibe (ZETIA) 10 MG tablet    Sig: Take 1 tablet (10 mg total) by mouth daily.    Dispense:  30 tablet    Refill:  5

## 2015-07-11 NOTE — Assessment & Plan Note (Signed)
I believe this is resolving neuralgia from mild case of zoster; almost resolved; will have her get PCV-13 in a few months (would rather wait until this has completely resolved)

## 2015-08-14 ENCOUNTER — Encounter: Payer: Self-pay | Admitting: Family Medicine

## 2015-08-14 ENCOUNTER — Ambulatory Visit (INDEPENDENT_AMBULATORY_CARE_PROVIDER_SITE_OTHER): Payer: PPO | Admitting: Family Medicine

## 2015-08-14 VITALS — BP 112/58 | HR 69 | Temp 97.7°F | Resp 12 | Wt 127.0 lb

## 2015-08-14 DIAGNOSIS — E785 Hyperlipidemia, unspecified: Secondary | ICD-10-CM

## 2015-08-14 DIAGNOSIS — I10 Essential (primary) hypertension: Secondary | ICD-10-CM

## 2015-08-14 DIAGNOSIS — Z1239 Encounter for other screening for malignant neoplasm of breast: Secondary | ICD-10-CM | POA: Diagnosis not present

## 2015-08-14 DIAGNOSIS — Z8673 Personal history of transient ischemic attack (TIA), and cerebral infarction without residual deficits: Secondary | ICD-10-CM | POA: Diagnosis not present

## 2015-08-14 NOTE — Progress Notes (Signed)
BP 112/58 mmHg  Pulse 69  Temp(Src) 97.7 F (36.5 C) (Oral)  Resp 12  Wt 127 lb (57.607 kg)  SpO2 98%   Subjective:    Patient ID: Yvonne Espinoza, female    DOB: November 08, 1940, 75 y.o.   MRN: [id number]  HPI: Yvonne Espinoza is a 75 y.o. female  Chief Complaint  Patient presents with  . Medication Refill  . Hyperlipidemia   She needs a mammogram, going to have that done soon; she'll reschedule  Blood pressure; excellent control, not usually a problem; limits salt in her diet  High cholesterol; we reviewed the last two lipid panels; she is taking Zetia; she is making changes to her diet; eats 2-3 eggs a week; very rare cheese; takes the skin off of chicken and [place]; rare has deep fried foods; no milk, rare 1% milk on cereal; eats special K cereal with raisins and bananas a few times a week     Ref Range 47mo ago  24mo ago     Cholesterol, Total 100 - 199 mg/dL 409 (H) 811 (H)    Triglycerides 0 - 149 mg/dL 914 782    HDL >95 mg/dL 59 39 mg/dL" AOZHY="[id number]" style="cursor: pointer;" onmouseover='jscript: var varStyle="underline"; var bgColor="#[id number]"; this.style.backgroundColor=bgColor; var children=this.getElementsByTagName("div"); for(var child=0;child 65    VLDL Cholesterol Cal 5 - 40 mg/dL 21 23    LDL Calculated 0 - 99 mg/dL 846 (H) 962 (H)        Last CMP was completely normal  Hx of TIA; she is taking full aspirin; does not tolerate statins  Lab Results  Component Value Date   TSH 1.353 04/25/2015   She gets cramps in her hand occasionally  Relevant past medical, surgical, family and social history reviewed and updated as indicated Past Medical History  Diagnosis Date  . TIA (transient ischemic attack)     2209,2010  . Spinal stenosis   . Diverticulitis   . Stroke [facility])     TIA 2009  . GERD (gastroesophageal reflux disease)   . Arthritis     RA  . Abscess     colon  . Kidney cysts   . Wears glasses   . 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 Colovesical fistula     2016  . SBO (small bowel obstruction) (HCC) 04/25/2015    partial    Past Surgical History  Procedure Laterality Date  . Shoulder surgery      right 2010  . Knee arthroscopy Bilateral   . Ileostomy N/A 06/26/2014    Procedure: LOOP ILEOSTOMY;  Surgeon: Frederik Schmidt, MD;  Location: [facility] OR;  Service: General;  Laterality: N/A;  . Colostomy revision N/A 06/26/2014    Procedure: SIGMOID COLECTOMY WITH BLADDER REPAIR;  Surgeon: Frederik Schmidt, MD;  Location: [facility] OR;  Service: General;  Laterality: N/A;  . Cystoscopy w/ ureteral stent placement Left 06/26/2014    Procedure: BOARI FLAP LEFT URETERAL REIMPLANT;  Surgeon: Chelsea Aus, MD;  Location: [facility] OR;  Service: Urology;  Laterality: Left;  . Dilation and curettage of uterus    . Colostomy reversal  09/26/2014    dr wyatt  . Transverse loop colostomy N/A 09/26/2014    Procedure: LOOP COLOSTOMY REVERESAL;  Surgeon: Jimmye Norman, MD;  Location: [facility] OR;  Service: General;  Laterality: N/A;  . Flexible sigmoidoscopy N/A 09/26/2014    Procedure: RIGID SIGMOIDOSCOPY;  Surgeon: Jimmye Norman, MD;  Location: [facility] OR;  Service: General;  Laterality: N/A;  . Esophageal  dilation  10/10/08  . Cardiovascular stress test  08/08/11 and 06/07/10   Social History  Substance Use Topics  . Smoking status: Never Smoker   . Smokeless tobacco: Never Used  . Alcohol Use: Yes     Comment: "rarely"    Interim medical history since our last visit reviewed. Allergies and medications reviewed and updated.  Review of Systems  Per HPI unless specifically indicated above     Objective:    BP 112/58 mmHg  Pulse 69  Temp(Src) 97.7 F (36.5 C) (Oral)  Resp 12  Wt 127 lb (57.607 kg)  SpO2 98%  Wt Readings from Last 3 Encounters:  08/14/15 127 lb (57.607 kg)  07/11/15 121 lb (54.885 kg)  06/28/15 123 lb (55.792 kg)    Physical Exam  Constitutional: She appears well-developed and well-nourished.  HENT:  Mouth/Throat: Mucous  membranes are normal.  Eyes: EOM are normal. No scleral icterus.  Cardiovascular: Normal rate and regular rhythm.   Pulmonary/Chest: Effort normal and breath sounds normal.  Musculoskeletal: She exhibits no edema.  Psychiatric: She has a normal mood and affect. Her behavior is normal.   Results for orders placed or performed in visit on 06/28/15  Lipid Panel w/o Chol/HDL Ratio  Result Value Ref Range   Cholesterol, Total 219 (H) 100 - 199 mg/dL   Triglycerides 469 0 - 149 mg/dL   HDL 59 >62 mg/dL   VLDL Cholesterol Cal 21 5 - 40 mg/dL   LDL Calculated 952 (H) 0 - 99 mg/dL  Comprehensive metabolic panel  Result Value Ref Range   Glucose 85 65 - 99 mg/dL   BUN 13 8 - 27 mg/dL   Creatinine, Ser 8.41 0.57 - 1.00 mg/dL   GFR calc non Af Amer 88 >59 mL/min/1.73   GFR calc Af Amer 102 >59 mL/min/1.73   BUN/Creatinine Ratio 20 11 - 26   Sodium 142 134 - 144 mmol/L   Potassium 4.7 3.5 - 5.2 mmol/L   Chloride 101 96 - 106 mmol/L   CO2 26 18 - 29 mmol/L   Calcium 9.4 8.7 - 10.3 mg/dL   Total Protein 6.3 6.0 - 8.5 g/dL   Albumin 4.0 3.5 - 4.8 g/dL   Globulin, Total 2.3 1.5 - 4.5 g/dL   Albumin/Globulin Ratio 1.7 1.1 - 2.5   Bilirubin Total 0.2 0.0 - 1.2 mg/dL   Alkaline Phosphatase 77 39 - 117 IU/L   AST 19 0 - 40 IU/L   ALT 17 0 - 32 IU/L  CBC with Differential/Platelet  Result Value Ref Range   WBC 7.9 3.4 - 10.8 x10E3/uL   RBC 4.49 3.77 - 5.28 x10E6/uL   Hemoglobin 13.0 11.1 - 15.9 g/dL   Hematocrit 32.4 40.1 - 46.6 %   MCV 89 79 - 97 fL   MCH 29.0 26.6 - 33.0 pg   MCHC 32.4 31.5 - 35.7 g/dL   RDW 02.7 25.3 - 66.4 %   Platelets 337 150 - 379 x10E3/uL   Neutrophils 50 %   Lymphs 35 %   Monocytes 10 %   Eos 4 %   Basos 1 %   Neutrophils Absolute 4.0 1.4 - 7.0 x10E3/uL   Lymphocytes Absolute 2.8 0.7 - 3.1 x10E3/uL   Monocytes Absolute 0.8 0.1 - 0.9 x10E3/uL   EOS (ABSOLUTE) 0.4 0.0 - 0.4 x10E3/uL   Basophils Absolute 0.0 0.0 - 0.2 x10E3/uL   Immature Granulocytes 0 %    Immature Grans (Abs) 0.0 0.0 - 0.1 x10E3/uL  Assessment & Plan:   Problem List Items Addressed This Visit      Cardiovascular and Mediastinum   Essential hypertension, benign    Excellent control        Other   Hyperlipidemia - Primary (Chronic)    Check labs on or after Sep 26, 2015; continue Zetia; work on diet      Relevant Orders   Lipid Panel w/o Chol/HDL Ratio   Hx-TIA (transient ischemic attack) (Chronic)    Full dose aspirin; cannot tolerate statins      Breast cancer screening   Relevant Orders   MM DIGITAL SCREENING BILATERAL      Follow up plan: Return in about 6 months (around 02/13/2016) for visit.  An after-visit summary was printed and given to the patient at check-out.  Please see the patient instructions which may contain other information and recommendations beyond what is mentioned above in the assessment and plan.

## 2015-08-14 NOTE — Patient Instructions (Addendum)
Please do call to schedule your mammogram; the number to schedule one at either [facility] or [facility] is [phone]  Pick up a bottle of magnesium 250 mg or 400 mg and take one daily if needed for cramps  Pick up a bottle of sublingual (SL) vitamin B12 and take one under the tongue daily or several days a week  Return for fasting labs only on or after Sep 26, 2015

## 2015-08-14 NOTE — Assessment & Plan Note (Signed)
Full dose aspirin; cannot tolerate statins

## 2015-08-14 NOTE — Assessment & Plan Note (Signed)
Excellent control.   

## 2015-08-14 NOTE — Assessment & Plan Note (Signed)
Check labs on or after Sep 26, 2015; continue Zetia; work on diet

## 2015-08-22 ENCOUNTER — Ambulatory Visit
Admission: RE | Admit: 2015-08-22 | Discharge: 2015-08-22 | Disposition: A | Discharge status: Home / Self Care | Payer: PPO | Source: Ambulatory Visit | Attending: Family Medicine | Admitting: Family Medicine

## 2015-08-22 ENCOUNTER — Telehealth: Payer: Self-pay

## 2015-08-22 DIAGNOSIS — Z1239 Encounter for other screening for malignant neoplasm of breast: Secondary | ICD-10-CM

## 2015-08-22 DIAGNOSIS — N644 Mastodynia: Secondary | ICD-10-CM | POA: Insufficient documentation

## 2015-08-22 NOTE — Telephone Encounter (Signed)
Orders entered thank you

## 2015-08-22 NOTE — Telephone Encounter (Signed)
Pt called Yvonne Espinoza for mammogram, they will not let her schedule normal mammogram due to her stating she has breast pain in left breast.  Patient states notified you of this at visit, but there is no lumps or bumps.  They will need diagnostic ordered

## 2015-09-09 ENCOUNTER — Telehealth: Payer: Self-pay | Admitting: Family Medicine

## 2015-09-09 NOTE — Telephone Encounter (Signed)
Please f/u on mammogram and breast ultrasounds for patient; thank you

## 2015-09-10 NOTE — Telephone Encounter (Signed)
Called patient and gave info to schedule

## 2015-09-20 ENCOUNTER — Other Ambulatory Visit: Payer: Self-pay | Admitting: *Deleted

## 2015-09-20 ENCOUNTER — Inpatient Hospital Stay
Admission: RE | Admit: 2015-09-20 | Discharge: 2015-09-20 | Disposition: A | Discharge status: Home / Self Care | Payer: Self-pay | Source: Ambulatory Visit | Attending: *Deleted | Admitting: *Deleted

## 2015-09-20 DIAGNOSIS — Z9289 Personal history of other medical treatment: Secondary | ICD-10-CM

## 2015-10-02 ENCOUNTER — Ambulatory Visit
Admission: RE | Admit: 2015-10-02 | Discharge: 2015-10-02 | Disposition: A | Discharge status: Home / Self Care | Payer: PPO | Source: Ambulatory Visit | Attending: Family Medicine | Admitting: Family Medicine

## 2015-10-02 ENCOUNTER — Ambulatory Visit: Admission: RE | Admit: 2015-10-02 | Payer: PPO | Source: Ambulatory Visit

## 2015-10-02 DIAGNOSIS — N644 Mastodynia: Secondary | ICD-10-CM | POA: Insufficient documentation

## 2015-10-02 DIAGNOSIS — R921 Mammographic calcification found on diagnostic imaging of breast: Secondary | ICD-10-CM | POA: Diagnosis not present

## 2015-10-03 LAB — LIPID PANEL W/O CHOL/HDL RATIO
CHOLESTEROL TOTAL: 223 mg/dL — AB (ref 100–199)
HDL: 55 mg/dL (ref >39)
LDL CALC: 132 mg/dL — AB (ref 0–99)
Triglycerides: 180 mg/dL — ABNORMAL HIGH (ref 0–149)
VLDL Cholesterol Cal: 36 mg/dL (ref 5–40)

## 2015-10-07 ENCOUNTER — Other Ambulatory Visit: Payer: Self-pay | Admitting: Family Medicine

## 2015-10-07 MED ORDER — EZETIMIBE 10 MG PO TABS
10.0000 mg | ORAL_TABLET | Freq: Every day | ORAL | Status: DC
Start: 2015-10-07 — End: 2016-02-13

## 2015-12-31 ENCOUNTER — Encounter: Payer: Self-pay | Admitting: Family Medicine

## 2015-12-31 ENCOUNTER — Ambulatory Visit (INDEPENDENT_AMBULATORY_CARE_PROVIDER_SITE_OTHER): Payer: PPO | Admitting: Family Medicine

## 2015-12-31 VITALS — BP 116/80 | HR 76 | Temp 98.2°F | Resp 18 | Wt 133.6 lb

## 2015-12-31 DIAGNOSIS — L259 Unspecified contact dermatitis, unspecified cause: Secondary | ICD-10-CM | POA: Diagnosis not present

## 2015-12-31 MED ORDER — PREDNISONE 10 MG (48) PO TBPK: ORAL_TABLET | Freq: Every day | ORAL | 0 refills | Status: DC

## 2015-12-31 NOTE — Progress Notes (Signed)
Name: Yvonne Espinoza   MRN: [id number]    DOB: 01-Feb-1941   Date:12/31/2015       Progress Note  Subjective  Chief Complaint  Chief Complaint  Patient presents with  . Poison Ivy    lft arm,abdomen,hip and rt breast, rt arm    HPI  Contact Dermatitis: she states she took prednisone  in the past for a prolonged period of time, she is does not like it because she does not like how she feels, but she did not have an anaphylactic reaction. She was treated with topical medication for poison ivy about one month ago, it resolved, however a couple of weeks ago she was pulling weeds and noticed the rash initially on both arms, red, blisters and itchy, she has tried topical triamcinolone without improvement. She states it is spreading. She has some on her right breast and inner thigh.   Patient Active Problem List   Diagnosis Date Noted  . Breast pain 08/22/2015  . Breast cancer screening 08/14/2015  . Left-sided chest wall pain 06/28/2015  . Essential hypertension, benign 05/18/2015  . History of hematemesis 04/27/2015  . Partial small bowel obstruction (HCC) 04/25/2015  . Hyperlipidemia 04/22/2015  . Hx of completed stroke 04/22/2015  . Medication monitoring encounter 04/22/2015  . Lower abdominal pain 04/22/2015  . Esophageal reflux 04/22/2015  . Hx-TIA (transient ischemic attack) 04/22/2015  . 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 Hx of diverticulitis of colon 05/01/2014    Past Surgical History:  Procedure Laterality Date  . CARDIOVASCULAR STRESS TEST  08/08/11 and 06/07/10  . COLOSTOMY REVERSAL  09/26/2014   dr wyatt  . COLOSTOMY REVISION N/A 06/26/2014   Procedure: SIGMOID COLECTOMY WITH BLADDER REPAIR;  Surgeon: Frederik Schmidt, MD;  Location: [facility] OR;  Service: General;  Laterality: N/A;  . CYSTOSCOPY W/ URETERAL STENT PLACEMENT Left 06/26/2014   Procedure: BOARI FLAP LEFT URETERAL REIMPLANT;  Surgeon: Chelsea Aus, MD;  Location: [facility] OR;  Service: Urology;  Laterality: Left;   . DILATION AND CURETTAGE OF UTERUS    . ESOPHAGEAL DILATION  10/10/08  . FLEXIBLE SIGMOIDOSCOPY N/A 09/26/2014   Procedure: RIGID SIGMOIDOSCOPY;  Surgeon: Jimmye Norman, MD;  Location: [facility] OR;  Service: General;  Laterality: N/A;  . ILEOSTOMY N/A 06/26/2014   Procedure: LOOP ILEOSTOMY;  Surgeon: Frederik Schmidt, MD;  Location: [facility] OR;  Service: General;  Laterality: N/A;  . KNEE ARTHROSCOPY Bilateral   . SHOULDER SURGERY     right 2010  . TRANSVERSE LOOP COLOSTOMY N/A 09/26/2014   Procedure: LOOP COLOSTOMY REVERESAL;  Surgeon: Jimmye Norman, MD;  Location: [facility] OR;  Service: General;  Laterality: N/A;    Family History  Problem Relation Age of Onset  . Diabetes Mother   . Diabetes Father   . Heart attack Father   . Heart disease Father   . Diabetes Sister   . Stroke Sister   . Glaucoma Brother   . Diabetes Brother   . Diabetes Sister   . Hypertension Son   . Stroke Maternal Aunt   . Cancer Neg Hx   . COPD Neg Hx     Social History   Social History  . Marital status: Married    Spouse name: N/A  . Number of children: N/A  . Years of education: N/A   Occupational History  . Not on file.   Social History Main Topics  . Smoking status: Never Smoker  . Smokeless tobacco: Never Used  . Alcohol use Yes  Comment: "rarely"  . Drug use: No  . Sexual activity: Not on file   Other Topics Concern  . Not on file   Social History Narrative  . No narrative on file     Current Outpatient Prescriptions:  .  aspirin EC 325 MG tablet, Take 325 mg by mouth daily., Disp: , Rfl:  .  BIOTIN PO, Take 10,000 mg by mouth daily., Disp: , Rfl:  .  Coenzyme Q10 (CO Q-10) 100 MG CAPS, Take 100 mg by mouth daily., Disp: , Rfl:  .  ezetimibe (ZETIA) 10 MG tablet, Take 1 tablet (10 mg total) by mouth daily., Disp: 30 tablet, Rfl: 11 .  folic acid (FOLVITE) 1 MG tablet, Take 1 mg by mouth daily. , Disp: , Rfl:  .  Lutein 40 MG CAPS, Take 40 mg by mouth daily., Disp: , Rfl:  .  Multiple Vitamin  (MULTIVITAMIN WITH MINERALS) TABS tablet, Take 1 tablet by mouth daily., Disp: , Rfl:  .  triamcinolone cream (KENALOG) 0.1 %, Apply 1 application topically 2 (two) times daily. Mix one to one with gentle moisturizing lotion; as needed, Disp: 45 g, Rfl: 1  Allergies  Allergen Reactions  . Gabapentin Other (See Comments)    "every side effect listed on pamphlet"  . Neosporin [Neomycin-Bacitracin Zn-Polymyx]   . Prednisone Other (See Comments)    Tachycardia/mood swings  . Statins Other (See Comments)    Swelling      ROS  Ten systems reviewed and is negative except as mentioned in HPI   Objective  Vitals:   12/31/15 1501  BP: 116/80  Pulse: 76  Resp: 18  Temp: 98.2 F (36.8 C)  SpO2: 97%  Weight: 133 lb 9 oz (60.6 kg)    Body mass index is 26.82 kg/m.  Physical Exam  Constitutional: Patient appears well-developed and well-nourished. O No distress.  HEENT: head atraumatic, normocephalic, pupils equal and reactive to light, e neck supple, throat within normal limits Cardiovascular: Normal rate, regular rhythm and normal heart sounds.  No murmur heard. No BLE edema. Pulmonary/Chest: Effort normal and breath sounds normal. No respiratory distress. Abdominal: Soft.  There is no tenderness. Psychiatric: Patient has a normal mood and affect. behavior is normal. Judgment and thought content normal. Rash: erythematous rash, with vesicle formation on both arms and vulva and on her breast  PHQ2/9: Depression screen [facility] 2/9 12/31/2015 08/14/2015  Decreased Interest 0 0  Down, Depressed, Hopeless 0 0  PHQ - 2 Score 0 0     Fall Risk: Fall Risk  12/31/2015 08/14/2015  Falls in the past year? No No     Functional Status Survey: Is the patient deaf or have difficulty hearing?: No Does the patient have difficulty seeing, even when wearing glasses/contacts?: No Does the patient have difficulty concentrating, remembering, or making decisions?: No Does the patient have  difficulty walking or climbing stairs?: No Does the patient have difficulty dressing or bathing?: No Does the patient have difficulty doing errands alone such as visiting a doctor's office or shopping?: No    Assessment & Plan  1. Contact dermatitis  - predniSONE (STERAPRED UNI-PAK 48 TAB) 10 MG (48) TBPK tablet; Take by mouth daily.  Dispense: 48 tablet; Refill: 0

## 2016-01-02 ENCOUNTER — Telehealth: Payer: Self-pay | Admitting: Family Medicine

## 2016-01-02 MED ORDER — TRIAMCINOLONE ACETONIDE 0.1 % EX CREA: 1.0000 "application " | TOPICAL_CREAM | Freq: Two times a day (BID) | CUTANEOUS | 1 refills | Status: DC

## 2016-01-02 NOTE — Telephone Encounter (Signed)
rx sent

## 2016-01-21 ENCOUNTER — Ambulatory Visit (INDEPENDENT_AMBULATORY_CARE_PROVIDER_SITE_OTHER): Payer: PPO

## 2016-01-21 DIAGNOSIS — Z23 Encounter for immunization: Secondary | ICD-10-CM

## 2016-02-13 ENCOUNTER — Ambulatory Visit (INDEPENDENT_AMBULATORY_CARE_PROVIDER_SITE_OTHER): Payer: PPO | Admitting: Family Medicine

## 2016-02-13 ENCOUNTER — Encounter: Payer: Self-pay | Admitting: Family Medicine

## 2016-02-13 VITALS — BP 116/70 | HR 76 | Temp 98.1°F | Resp 14 | Wt 133.0 lb

## 2016-02-13 DIAGNOSIS — Z5181 Encounter for therapeutic drug level monitoring: Secondary | ICD-10-CM | POA: Diagnosis not present

## 2016-02-13 DIAGNOSIS — Z8673 Personal history of transient ischemic attack (TIA), and cerebral infarction without residual deficits: Secondary | ICD-10-CM

## 2016-02-13 DIAGNOSIS — E782 Mixed hyperlipidemia: Secondary | ICD-10-CM | POA: Diagnosis not present

## 2016-02-13 DIAGNOSIS — I10 Essential (primary) hypertension: Secondary | ICD-10-CM

## 2016-02-13 LAB — CBC WITH DIFFERENTIAL/PLATELET
BASOS PCT: 1 %
Basophils Absolute: 74 cells/uL (ref 0–200)
EOS PCT: 4 %
Eosinophils Absolute: 296 cells/uL (ref 15–500)
HCT: 43 % (ref 35.0–45.0)
HEMOGLOBIN: 14 g/dL (ref 11.7–15.5)
LYMPHS ABS: 1850 {cells}/uL (ref 850–3900)
Lymphocytes Relative: 25 %
MCH: 30.2 pg (ref 27.0–33.0)
MCHC: 32.6 g/dL (ref 32.0–36.0)
MCV: 92.9 fL (ref 80.0–100.0)
MONO ABS: 814 {cells}/uL (ref 200–950)
MPV: 10 fL (ref 7.5–12.5)
Monocytes Relative: 11 %
Neutro Abs: 4366 cells/uL (ref 1500–7800)
Neutrophils Relative %: 59 %
PLATELETS: 294 10*3/uL (ref 140–400)
RBC: 4.63 MIL/uL (ref 3.80–5.10)
RDW: 15.7 % — AB (ref 11.0–15.0)
WBC: 7.4 10*3/uL (ref 3.8–10.8)

## 2016-02-13 LAB — LIPID PANEL
Cholesterol: 249 mg/dL — ABNORMAL HIGH (ref 125–200)
HDL: 49 mg/dL (ref >=46)
LDL CALC: 165 mg/dL — AB (ref <130)
Total CHOL/HDL Ratio: 5.1 Ratio — ABNORMAL HIGH (ref <=5.0)
Triglycerides: 174 mg/dL — ABNORMAL HIGH (ref <150)
VLDL: 35 mg/dL — ABNORMAL HIGH (ref <30)

## 2016-02-13 LAB — COMPLETE METABOLIC PANEL WITH GFR
ALK PHOS: 67 U/L (ref 33–130)
ALT: 21 U/L (ref 6–29)
AST: 21 U/L (ref 10–35)
Albumin: 3.7 g/dL (ref 3.6–5.1)
BUN: 14 mg/dL (ref 7–25)
CALCIUM: 9.2 mg/dL (ref 8.6–10.4)
CHLORIDE: 104 mmol/L (ref 98–110)
CO2: 29 mmol/L (ref 20–31)
Creat: 0.82 mg/dL (ref 0.60–0.93)
GFR, EST AFRICAN AMERICAN: 82 mL/min (ref >=60)
GFR, EST NON AFRICAN AMERICAN: 71 mL/min (ref >=60)
Glucose, Bld: 94 mg/dL (ref 65–99)
POTASSIUM: 4.4 mmol/L (ref 3.5–5.3)
Sodium: 141 mmol/L (ref 135–146)
Total Bilirubin: 0.4 mg/dL (ref 0.2–1.2)
Total Protein: 6 g/dL — ABNORMAL LOW (ref 6.1–8.1)

## 2016-02-13 NOTE — Patient Instructions (Signed)
Your goal blood pressure is less than 150 mmHg on top. Try to follow the DASH guidelines (DASH stands for Dietary Approaches to Stop Hypertension) Try to limit the sodium in your diet.  Ideally, consume less than 1.5 grams (less than 1,500mg ) per day. Do not add salt when cooking or at the table.  Check the sodium amount on labels when shopping, and choose items lower in sodium when given a choice. Avoid or limit foods that already contain a lot of sodium. Eat a diet rich in fruits and vegetables and whole grains. Try to limit saturated fats in your diet (bologna, hot dogs, barbeque, cheeseburgers, hamburgers, steak, bacon, sausage, cheese, etc.) and get more fresh fruits, vegetables, and whole grains

## 2016-02-13 NOTE — Progress Notes (Signed)
BP 116/70   Pulse 76   Temp 98.1 F (36.7 C) (Oral)   Resp 14   Wt 133 lb (60.3 kg)   SpO2 98%   BMI 26.70 kg/m    Subjective:    Patient ID: Yvonne Espinoza, female    DOB: [birth year], 75 y.o.   MRN: [id number]  HPI: Yvonne Espinoza is a 75 y.o. female  Chief Complaint  Patient presents with  . Follow-up    6 months   High cholesterol; she is no longer on the zetia; she can't remember when she quit taking it; she also doesn't know why she quit taking it; maybe irritated her stomach, but can't remember; not cost; she is taking coenzyme Q; trying to watch what she eats  Hypertension; controlled; no medicine whatsoever; she has never been on anything before; she had a few episodes of high pressures, but nothing consistent  Twinges of pain where the reversal was; she has an appt with the bladder doctor; turned loose by the surgeon; no blood in the stool  Hx of TIA; no neurologic signs; denies trouble swallowing, extremity weakness; taking full aspirin daily  Depression screen [facility] 2/9 02/13/2016 12/31/2015 08/14/2015  Decreased Interest 0 0 0  Down, Depressed, Hopeless 0 0 0  PHQ - 2 Score 0 0 0   Relevant past medical, surgical, family and social history reviewed Past Medical History:  Diagnosis Date  . Abscess    colon  . Arthritis    RA  . Colovesical fistula    2016  . Diverticulitis   . GERD (gastroesophageal reflux disease)   . Kidney cysts   . SBO (small bowel obstruction) 04/25/2015   partial   . Spinal stenosis   . Stroke [facility])    TIA 2009  . TIA (transient ischemic attack)    2209,2010  . 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 Wears glasses    Past Surgical History:  Procedure Laterality Date  . CARDIOVASCULAR STRESS TEST  08/08/11 and 06/07/10  . COLOSTOMY REVERSAL  09/26/2014   dr wyatt  . COLOSTOMY REVISION N/A 06/26/2014   Procedure: SIGMOID COLECTOMY WITH BLADDER REPAIR;  Surgeon: Frederik Schmidt, MD;  Location: [facility] OR;  Service: General;   Laterality: N/A;  . CYSTOSCOPY W/ URETERAL STENT PLACEMENT Left 06/26/2014   Procedure: BOARI FLAP LEFT URETERAL REIMPLANT;  Surgeon: Chelsea Aus, MD;  Location[facility] OR;  Service: Urology;  Laterality: Left;  . DILATION AND CURETTAGE OF UTERUS    . ESOPHAGEAL DILATION  10/10/08  . FLEXIBLE SIGMOIDOSCOPY N/A 09/26/2014   Procedure: RIGID SIGMOIDOSCOPY;  Surgeon: Jimmye Norman, MD;  Location: [facility] OR;  Service: General;  Laterality: N/A;  . ILEOSTOMY N/A 06/26/2014   Procedure: LOOP ILEOSTOMY;  Surgeon: Frederik Schmidt, MD;  Location: [facility] OR;  Service: General;  Laterality: N/A;  . KNEE ARTHROSCOPY Bilateral   . SHOULDER SURGERY     right 2010  . TRANSVERSE LOOP COLOSTOMY N/A 09/26/2014   Procedure: LOOP COLOSTOMY REVERESAL;  Surgeon: Jimmye Norman, MD;  Location: [facility] OR;  Service: General;  Laterality: N/A;   Family History  Problem Relation Age of Onset  . Diabetes Mother   . Diabetes Father   . Heart attack Father   . Heart disease Father   . Diabetes Sister   . Stroke Sister   . Glaucoma Brother   . Diabetes Brother   . Diabetes Sister   . Hypertension Son   . Stroke Maternal Aunt   .  Cancer Neg Hx   . COPD Neg Hx    Social History  Substance Use Topics  . Smoking status: Never Smoker  . Smokeless tobacco: Never Used  . Alcohol use Yes     Comment: "rarely"   Interim medical history since last visit reviewed. Allergies and medications reviewed  Review of Systems Per HPI unless specifically indicated above     Objective:    BP 116/70   Pulse 76   Temp 98.1 F (36.7 C) (Oral)   Resp 14   Wt 133 lb (60.3 kg)   SpO2 98%   BMI 26.70 kg/m   Wt Readings from Last 3 Encounters:  02/13/16 133 lb (60.3 kg)  12/31/15 133 lb 9 oz (60.6 kg)  08/14/15 127 lb (57.6 kg)    Physical Exam  Constitutional: She appears well-developed and well-nourished. No distress.  HENT:  Head: Normocephalic and atraumatic.  Eyes: EOM are normal. No scleral icterus.  Neck: No thyromegaly present.   Cardiovascular: Normal rate, regular rhythm and normal heart sounds.   Pulmonary/Chest: Effort normal and breath sounds normal. No respiratory distress. She has no wheezes.  Abdominal: Soft. Bowel sounds are normal. She exhibits no distension.  Musculoskeletal: Normal range of motion. She exhibits no edema.  Neurological: She is alert. She exhibits normal muscle tone.  Skin: Skin is warm and dry. She is not diaphoretic. No pallor.  Psychiatric: She has a normal mood and affect. Her behavior is normal. Judgment and thought content normal.    Results for orders placed or performed in visit on 08/14/15  Lipid Panel w/o Chol/HDL Ratio  Result Value Ref Range   Cholesterol, Total 223 (H) 100 - 199 mg/dL   Triglycerides 976 (H) 0 - 149 mg/dL   HDL 55 >73 mg/dL   VLDL Cholesterol Cal 36 5 - 40 mg/dL   LDL Calculated 419 (H) 0 - 99 mg/dL      Assessment & Plan:   Problem List Items Addressed This Visit      Cardiovascular and Mediastinum   Essential hypertension, benign - Primary (Chronic)    Well-controlled today        Other   Medication monitoring encounter    Check CBC on the aspirin      Relevant Orders   COMPLETE METABOLIC PANEL WITH GFR   CBC with Differential/Platelet   Hyperlipidemia (Chronic)    Try to limit saturated fats; no longer on zetia; intolerant to statins, and refuses to try any more      Relevant Orders   Lipid panel   Hx-TIA (transient ischemic attack) (Chronic)    Continue aspirin; asymptomatic; check lipids; control pressures       Other Visit Diagnoses   None.     Follow up plan: Return for fasting labs and visit; Medicare visit when due.  An after-visit summary was printed and given to the patient at check-out.  Please see the patient instructions which may contain other information and recommendations beyond what is mentioned above in the assessment and plan.  No orders of the defined types were placed in this encounter.   Orders Placed  This Encounter  Procedures  . Lipid panel  . COMPLETE METABOLIC PANEL WITH GFR  . CBC with Differential/Platelet

## 2016-02-13 NOTE — Assessment & Plan Note (Signed)
Continue aspirin; asymptomatic; check lipids; control pressures

## 2016-02-13 NOTE — Assessment & Plan Note (Signed)
Check CBC on the aspirin

## 2016-02-13 NOTE — Assessment & Plan Note (Signed)
Well controlled today.

## 2016-02-13 NOTE — Assessment & Plan Note (Addendum)
Try to limit saturated fats; no longer on zetia; intolerant to statins, and refuses to try any more

## 2016-03-21 ENCOUNTER — Telehealth: Payer: Self-pay | Admitting: Family Medicine

## 2016-03-21 DIAGNOSIS — R928 Other abnormal and inconclusive findings on diagnostic imaging of breast: Secondary | ICD-10-CM

## 2016-03-21 NOTE — Telephone Encounter (Signed)
I called to let patient know that I've put in orders for her to have both breasts examined again with mammo and right breast with ultrasound; talked with her; she has not felt anything in either breast since those pictures were made

## 2016-03-21 NOTE — Assessment & Plan Note (Signed)
Right breast was reported in May as abnormal, radiologist was going to get [place]; I do not see report; I am ordering another diag mammo and [place] of RIGHT breast in addition to repeat diag mammo of LEFT breast as requested in May for 6 month f/u

## 2016-04-11 ENCOUNTER — Telehealth: Payer: Self-pay

## 2016-04-11 NOTE — Telephone Encounter (Signed)
Call to schedule diagnostic mammo on monday

## 2016-04-14 NOTE — Telephone Encounter (Signed)
Scheduled for jan 11 @11 :20

## 2016-05-05 DIAGNOSIS — K222 Esophageal obstruction: Secondary | ICD-10-CM

## 2016-05-05 HISTORY — PX: OTHER SURGICAL HISTORY: SHX169

## 2016-05-05 HISTORY — DX: Esophageal obstruction: K22.2

## 2016-05-15 ENCOUNTER — Ambulatory Visit
Admission: RE | Admit: 2016-05-15 | Discharge: 2016-05-15 | Disposition: A | Discharge status: Home / Self Care | Payer: Medicare HMO | Source: Ambulatory Visit | Attending: Family Medicine | Admitting: Family Medicine

## 2016-05-15 ENCOUNTER — Other Ambulatory Visit: Payer: Self-pay | Admitting: Family Medicine

## 2016-05-15 DIAGNOSIS — R928 Other abnormal and inconclusive findings on diagnostic imaging of breast: Secondary | ICD-10-CM

## 2016-05-15 DIAGNOSIS — R921 Mammographic calcification found on diagnostic imaging of breast: Secondary | ICD-10-CM | POA: Insufficient documentation

## 2016-05-19 IMAGING — CR DG ABD PORTABLE 1V
1 series · 1 of 1 positions shown · non-contrast
Comparison: April 25, 2015 CT abdomen pelvis

CLINICAL DATA: Small bowel obstruction

EXAM:
PORTABLE ABDOMEN - 1 VIEW

[AP]
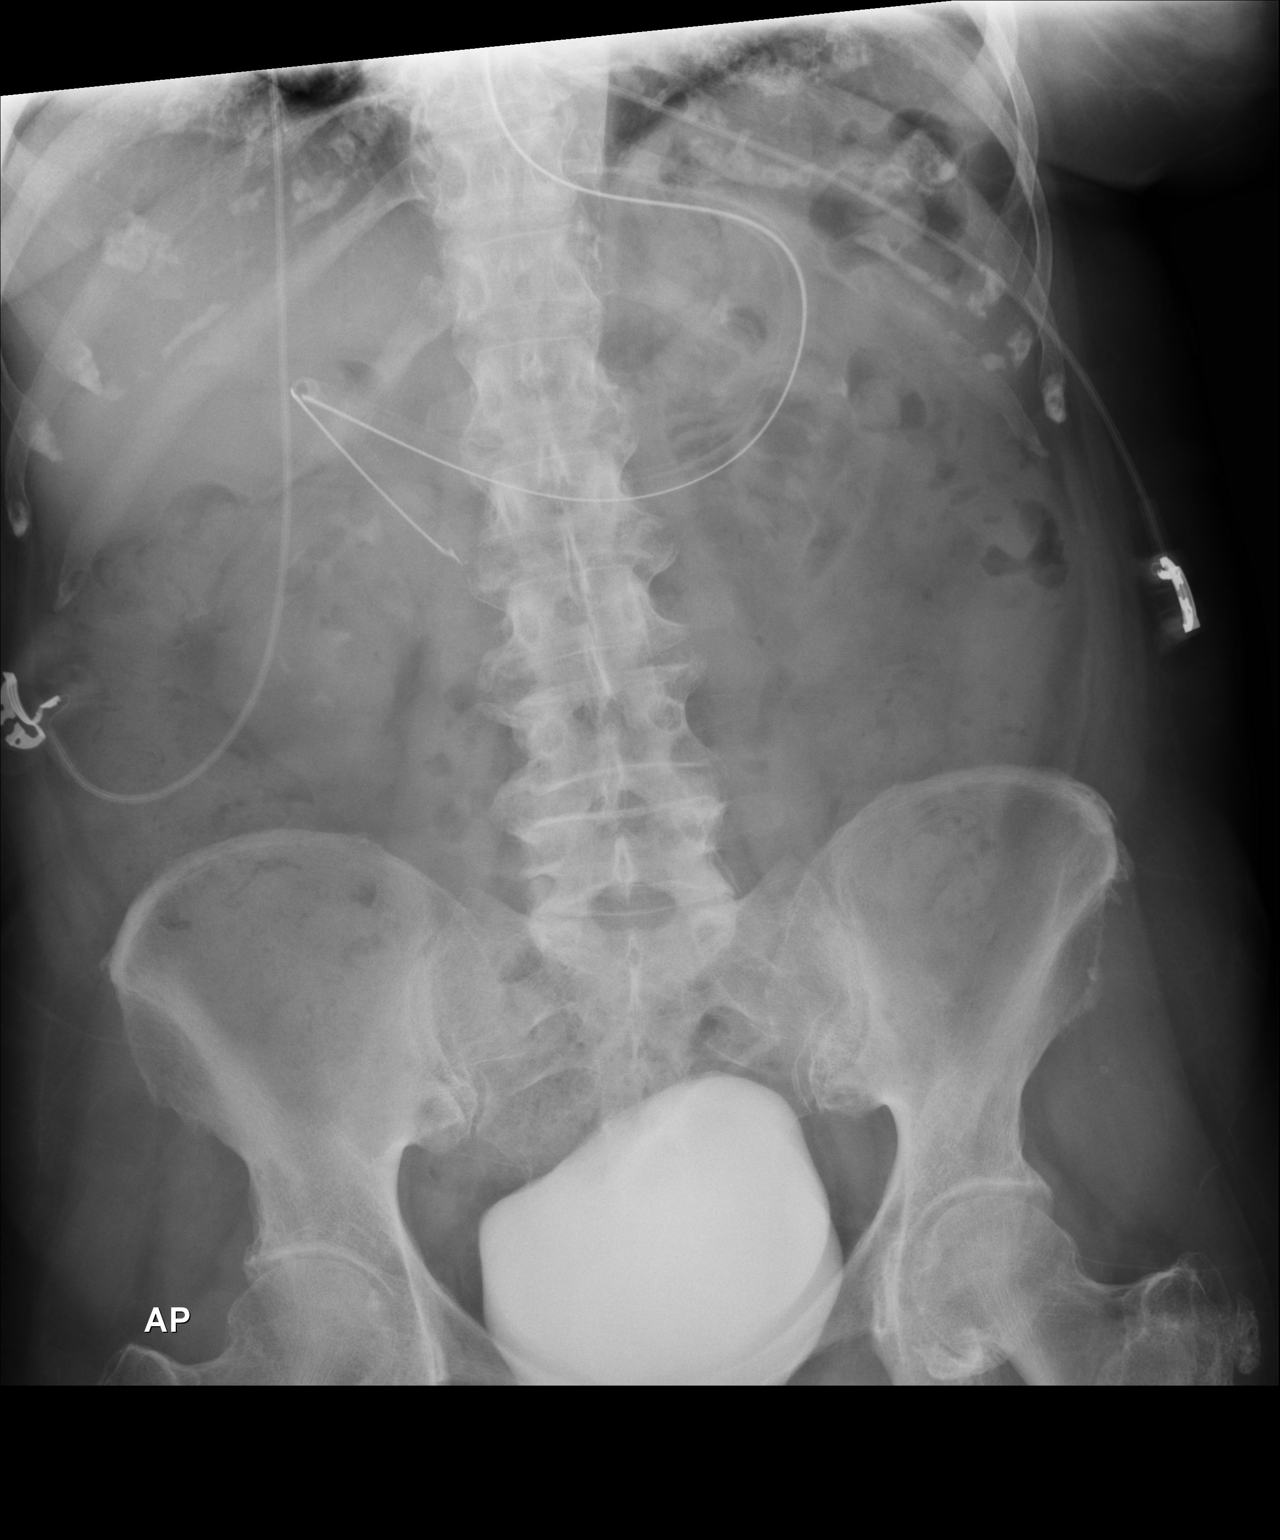

[1 of 1 positions shown; findings below may reference images not displayed]

FINDINGS: The bowel gas pattern is normal. A feeding tube is identified with
distal tip probably in the proximal duodenum. No radio-opaque
calculi or other significant radiographic abnormality are seen.
Residual contrast is identified in the bladder.
IMPRESSION: No plain film evidence of small bowel obstruction. A feeding tube is
identified distal tip probably in the proximal duodenum.

## 2016-05-19 IMAGING — CT CT ABD-PELV W/ CM
2 of 5 series · 17 of 46 positions shown, 19 images · IV contrast (APPLIED)
Comparison: 06/21/2014

CLINICAL DATA: Abdominal pain and vomiting

EXAM:
CT ABDOMEN AND PELVIS WITH CONTRAST
TECHNIQUE: Multidetector CT imaging of the abdomen and pelvis was performed
using the standard protocol following bolus administration of
intravenous contrast.
CONTRAST:  100 cc Omnipaque 300 intravenous

[Series 2: abd/ pelvis 5.0 i30f 1 · axial · 0.67mm/px · z∈[-442,-52]mm · 14 of 88 slices shown, 16 images]
[im 5/88  soft-tissue]
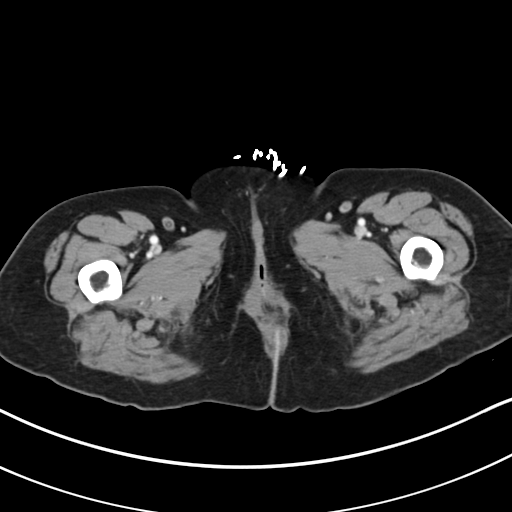
[im 5/88  bone]
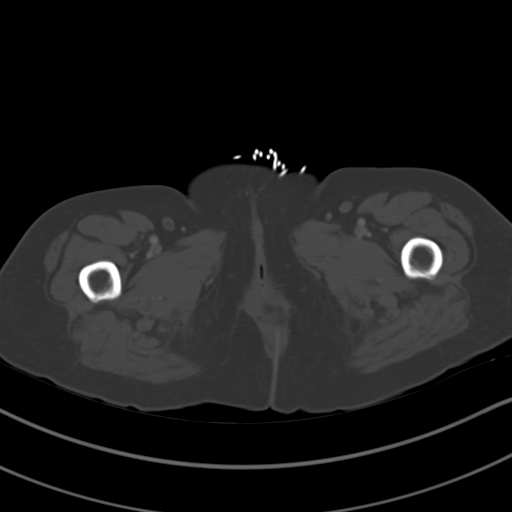
[im 10/88  soft-tissue]
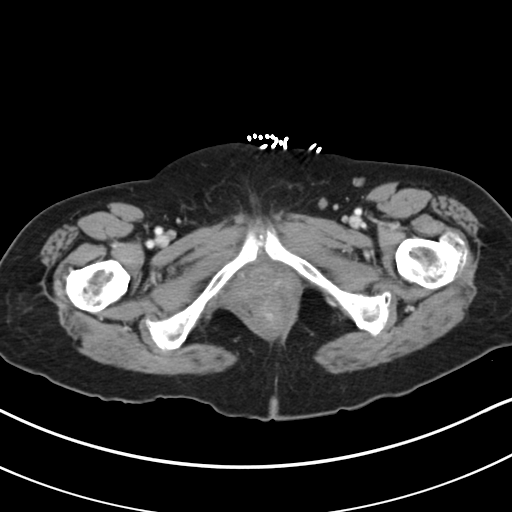
[im 19/88  soft-tissue]
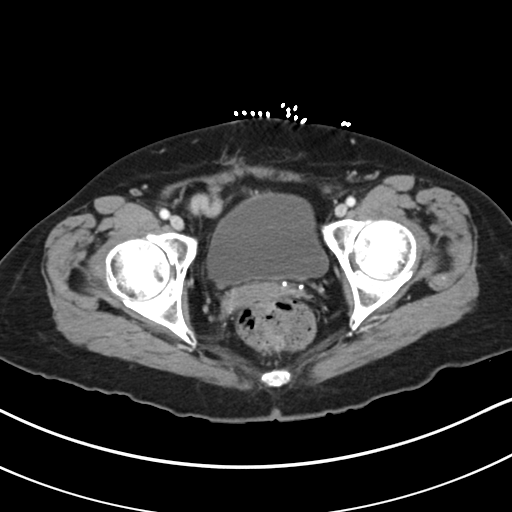
[im 23/88  soft-tissue]
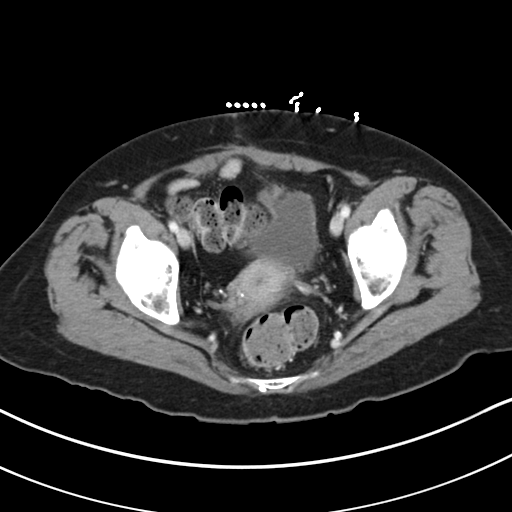
[im 28/88  soft-tissue]
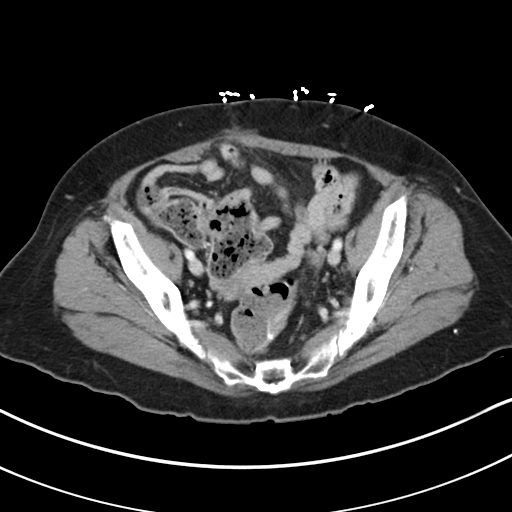
[im 37/88  soft-tissue]
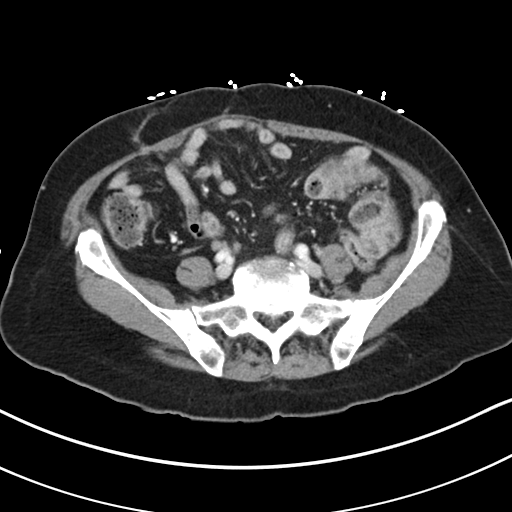
[im 42/88  soft-tissue]
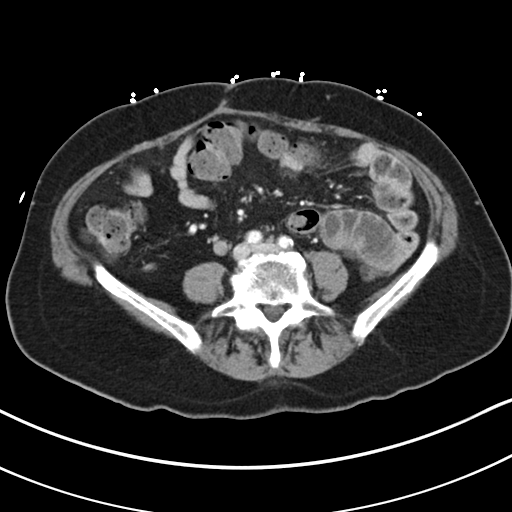
[im 46/88  soft-tissue]
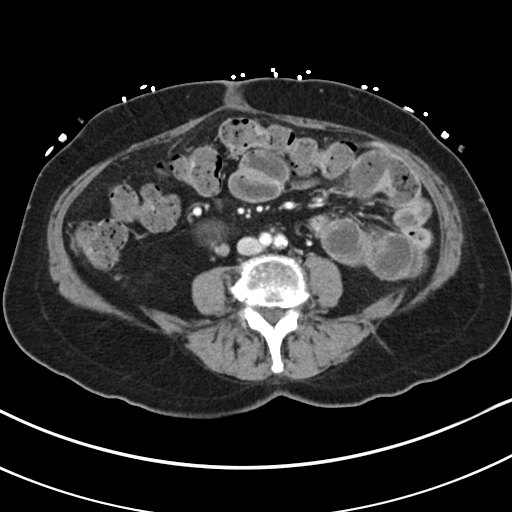
[im 51/88  soft-tissue]
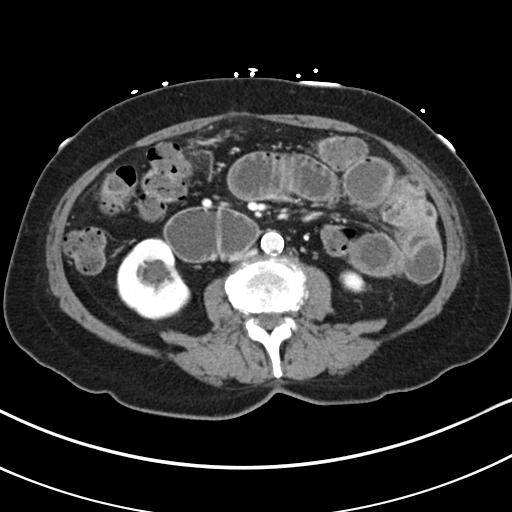
[im 51/88  bone]
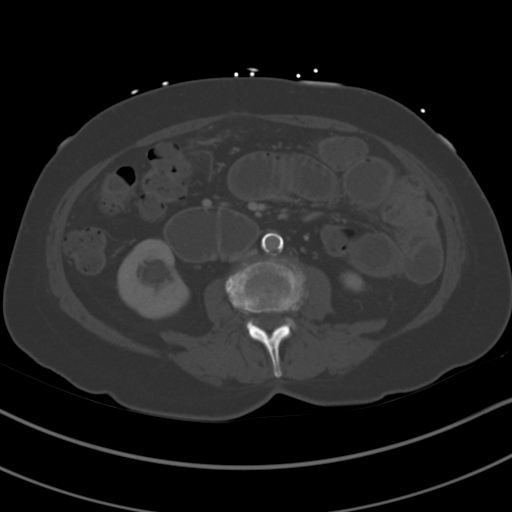
[im 60/88  soft-tissue]
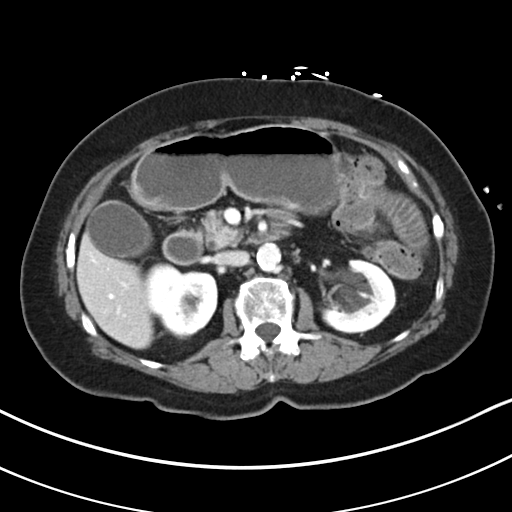
[im 65/88  soft-tissue]
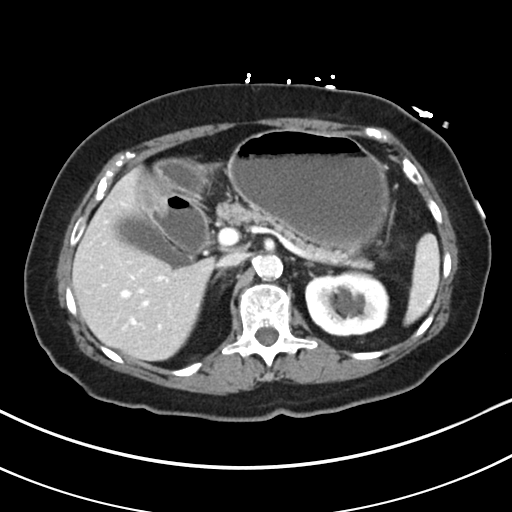
[im 69/88  soft-tissue]
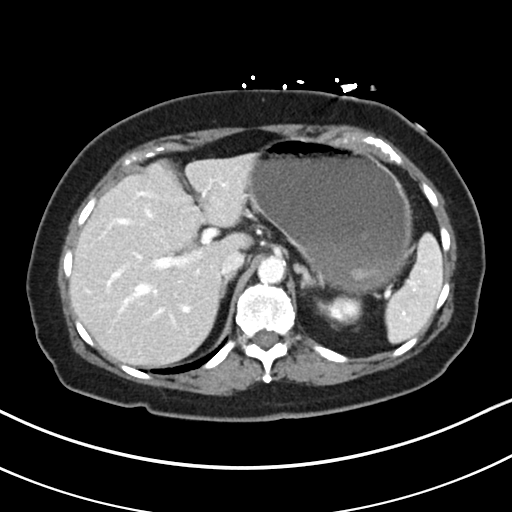
[im 78/88  soft-tissue]
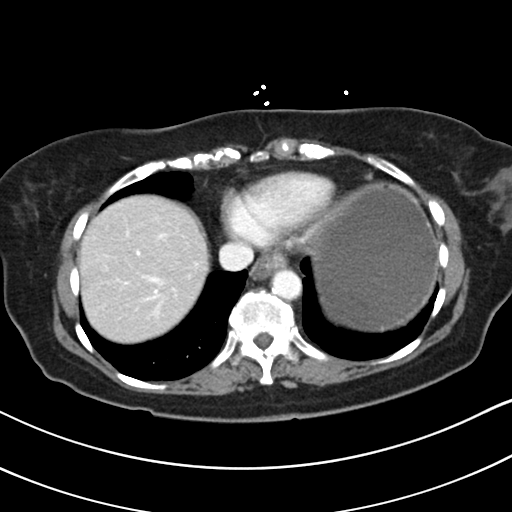
[im 83/88  soft-tissue]
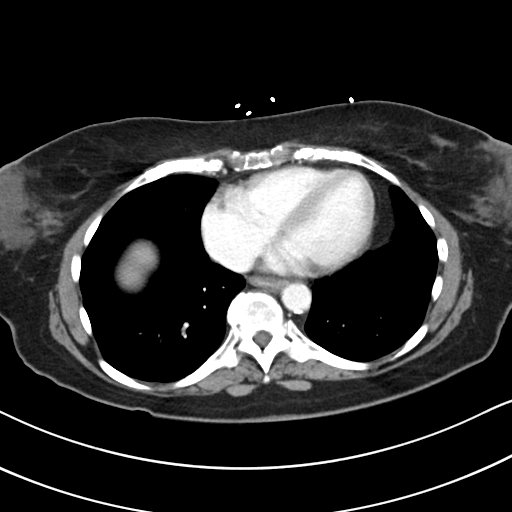

[Series 5: coronal soft tissue · coronal · 0.80mm/px · 3 of 76 slices shown]
[im 26/76  soft-tissue]
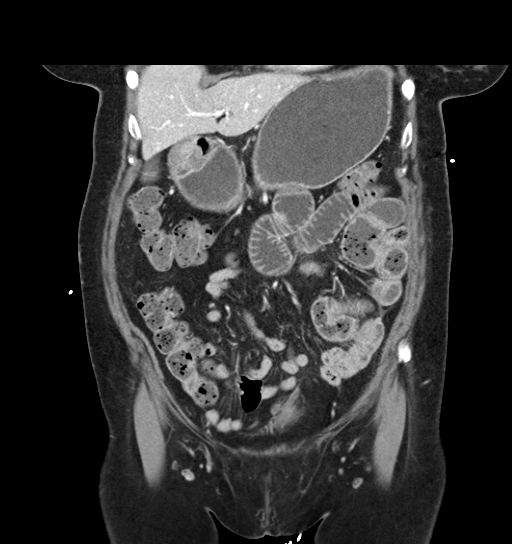
[im 34/76  soft-tissue]
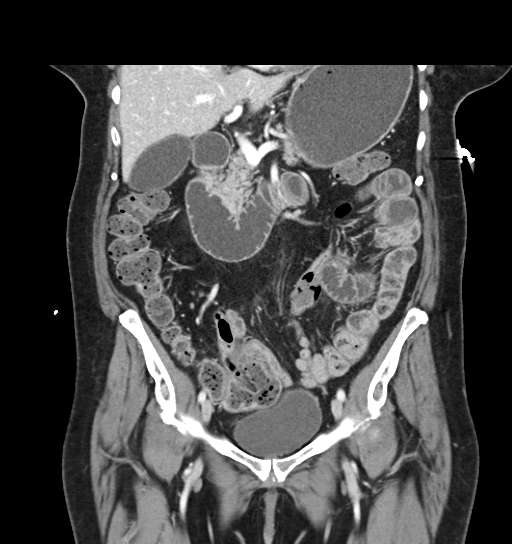
[im 42/76  soft-tissue]
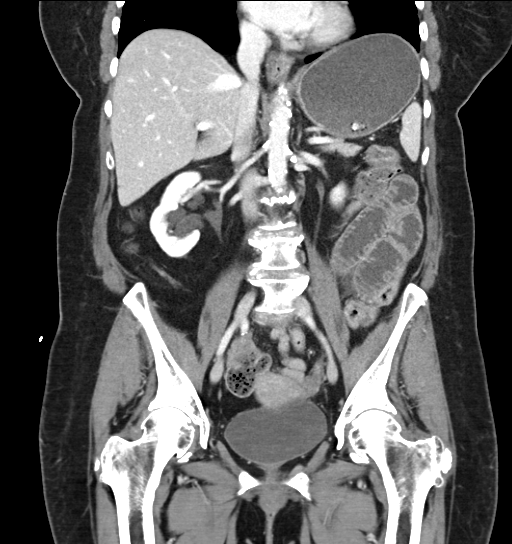

[17 of 46 positions shown; findings below may reference images not displayed]

FINDINGS: Lower chest and abdominal wall:  No contributory findings.

Hepatobiliary: Sub cm cysts in the medial left liver and subcapsular
posterior right liver.No evidence of biliary obstruction or stone.

Pancreas: Unremarkable.

Spleen: Chronic granulomatous changes

Adrenals/Urinary Tract: Negative adrenals. Bilateral renal sinus
cysts. Unremarkable bladder.

Reproductive:No pathologic findings.

Stomach/Bowel: Dilated and fluid-filled proximal small bowel with
normalization via the mid jejunum at the level of the left lower
quadrant. No discrete transition point is identified. There is no
evidence of mass or hernia. Changes may reflect adhesive disease,
which is seen in the right lower quadrant where ileal loops are
abnormally close to the distorted cecum. No appendicitis or other
bowel inflammation. Status post sigmoidectomy with patent
anastomosis.

Vascular/Lymphatic: No acute vascular abnormality. No mass or
adenopathy.

Peritoneal: No ascites or pneumoperitoneum.

Musculoskeletal: No acute abnormalities.
IMPRESSION: 1. Partial small bowel obstruction with transition in the left lower
quadrant. No discrete cause is identified.
2. Adhesive changes noted in the right lower quadrant but without
regional obstruction.

## 2016-06-12 DIAGNOSIS — H40013 Open angle with borderline findings, low risk, bilateral: Secondary | ICD-10-CM | POA: Diagnosis not present

## 2016-08-14 ENCOUNTER — Encounter: Payer: Self-pay | Admitting: Family Medicine

## 2016-08-14 ENCOUNTER — Ambulatory Visit (INDEPENDENT_AMBULATORY_CARE_PROVIDER_SITE_OTHER): Payer: Medicare HMO | Admitting: Family Medicine

## 2016-08-14 VITALS — BP 118/74 | HR 76 | Temp 97.5°F | Resp 14 | Wt 136.2 lb

## 2016-08-14 DIAGNOSIS — E8809 Other disorders of plasma-protein metabolism, not elsewhere classified: Secondary | ICD-10-CM | POA: Diagnosis not present

## 2016-08-14 DIAGNOSIS — R921 Mammographic calcification found on diagnostic imaging of breast: Secondary | ICD-10-CM | POA: Diagnosis not present

## 2016-08-14 DIAGNOSIS — E782 Mixed hyperlipidemia: Secondary | ICD-10-CM

## 2016-08-14 DIAGNOSIS — Z0000 Encounter for general adult medical examination without abnormal findings: Secondary | ICD-10-CM | POA: Diagnosis not present

## 2016-08-14 DIAGNOSIS — Z5181 Encounter for therapeutic drug level monitoring: Secondary | ICD-10-CM | POA: Diagnosis not present

## 2016-08-14 DIAGNOSIS — Z66 Do not resuscitate: Secondary | ICD-10-CM | POA: Diagnosis not present

## 2016-08-14 DIAGNOSIS — R140 Abdominal distension (gaseous): Secondary | ICD-10-CM

## 2016-08-14 HISTORY — DX: Do not resuscitate: Z66

## 2016-08-14 LAB — CBC WITH DIFFERENTIAL/PLATELET
BASOS ABS: 0 {cells}/uL (ref 0–200)
Basophils Relative: 0 %
EOS PCT: 5 %
Eosinophils Absolute: 365 cells/uL (ref 15–500)
HCT: 44.6 % (ref 35.0–45.0)
Hemoglobin: 14.6 g/dL (ref 11.7–15.5)
Lymphocytes Relative: 31 %
Lymphs Abs: 2263 cells/uL (ref 850–3900)
MCH: 30 pg (ref 27.0–33.0)
MCHC: 32.7 g/dL (ref 32.0–36.0)
MCV: 91.6 fL (ref 80.0–100.0)
MONOS PCT: 11 %
MPV: 9.6 fL (ref 7.5–12.5)
Monocytes Absolute: 803 cells/uL (ref 200–950)
NEUTROS ABS: 3869 {cells}/uL (ref 1500–7800)
NEUTROS PCT: 53 %
PLATELETS: 298 10*3/uL (ref 140–400)
RBC: 4.87 MIL/uL (ref 3.80–5.10)
RDW: 14.9 % (ref 11.0–15.0)
WBC: 7.3 10*3/uL (ref 3.8–10.8)

## 2016-08-14 MED ORDER — TRIAMCINOLONE ACETONIDE 0.1 % EX CREA: 1.0000 "application " | TOPICAL_CREAM | Freq: Two times a day (BID) | CUTANEOUS | 1 refills | Status: AC

## 2016-08-14 NOTE — Assessment & Plan Note (Signed)
Check labs 

## 2016-08-14 NOTE — Patient Instructions (Addendum)
I recommend that you get 1200 mg calcium daily, divided up into 2 or 3 servings The best way to get calcium is through your food/drinks Keep those legs strong *Request vaccine record from [facility] in [place]* I believe you are likely due for another pneumonia vaccine, but we want to make sure  Health Maintenance  Topic Date Due  . PNA vac Low Risk Adult (2 of 2 - PCV13) 05/05/2014  . MAMMOGRAM  10/01/2016  . INFLUENZA VACCINE  12/03/2016  . TETANUS/TDAP  05/06/2023  . COLONOSCOPY  04/04/2024  . DEXA SCAN  Addressed   Please do bring me a copy of your screening test with the bone density  Check out the information at familydoctor.org entitled "Nutrition for Weight Loss: What You Need to Know about Fad Diets" Try to lose between 1-2 pounds per week by taking in fewer calories and burning off more calories You can succeed by limiting portions, limiting foods dense in calories and fat, becoming more active, and drinking 8 glasses of water a day (64 ounces) Don't skip meals, especially breakfast, as skipping meals may alter your metabolism Do not use over-the-counter weight loss pills or gimmicks that claim rapid weight loss A healthy BMI (or body mass index) is between 18.5 and 24.9 You can calculate your ideal BMI at the [place] website [employer]fr Health Maintenance, Female Adopting a healthy lifestyle and getting preventive care can go a long way to promote health and wellness. Talk with your health care provider about what schedule of regular examinations is right for you. This is a good chance for you to check in with your provider about disease prevention and staying healthy. In between checkups, there are plenty of things you can do on your own. Experts have done a lot of research about which lifestyle changes and preventive measures are most likely to keep you healthy. Ask your health care provider for more information. Weight and  diet Eat a healthy diet  Be sure to include plenty of vegetables, fruits, low-fat dairy products, and lean protein.  Do not eat a lot of foods high in solid fats, added sugars, or salt.  Get regular exercise. This is one of the most important things you can do for your health.  Most adults should exercise for at least 150 minutes each week. The exercise should increase your heart rate and make you sweat (moderate-intensity exercise).  Most adults should also do strengthening exercises at least twice a week. This is in addition to the moderate-intensity exercise. Maintain a healthy weight  Body mass index (BMI) is a measurement that can be used to identify possible weight problems. It estimates body fat based on height and weight. Your health care provider can help determine your BMI and help you achieve or maintain a healthy weight.  For females 56 years of age and older:  A BMI below 18.5 is considered underweight.  A BMI of 18.5 to 24.9 is normal.  A BMI of 25 to 29.9 is considered overweight.  A BMI of 30 and above is considered obese. Watch levels of cholesterol and blood lipids  You should start having your blood tested for lipids and cholesterol at 75 years of age, then have this test every 5 years.  You may need to have your cholesterol levels checked more often if:  Your lipid or cholesterol levels are high.  You are older than 75 years of age.  You are at high risk for heart disease. Cancer screening Lung Cancer  Lung cancer screening is recommended for adults 35-50 years old who are at high risk for lung cancer because of a history of smoking.  A yearly low-dose CT scan of the lungs is recommended for people who:  Currently smoke.  Have quit within the past 15 years.  Have at least a 30-pack-year history of smoking. A pack year is smoking an average of one pack of cigarettes a day for 1 year.  Yearly screening should continue until it has been 15 years since  you quit.  Yearly screening should stop if you develop a health problem that would prevent you from having lung cancer treatment. Breast Cancer  Practice breast self-awareness. This means understanding how your breasts normally appear and feel.  It also means doing regular breast self-exams. Let your health care provider know about any changes, no matter how small.  If you are in your 20s or 30s, you should have a clinical breast exam (CBE) by a health care provider every 1-3 years as part of a regular health exam.  If you are 87 or older, have a CBE every year. Also consider having a breast X-ray (mammogram) every year.  If you have a family history of breast cancer, talk to your health care provider about genetic screening.  If you are at high risk for breast cancer, talk to your health care provider about having an MRI and a mammogram every year.  Breast cancer gene (BRCA) assessment is recommended for women who have family members with BRCA-related cancers. BRCA-related cancers include:  Breast.  Ovarian.  Tubal.  Peritoneal cancers.  Results of the assessment will determine the need for genetic counseling and BRCA1 and BRCA2 testing. Cervical Cancer  Your health care provider may recommend that you be screened regularly for cancer of the pelvic organs (ovaries, uterus, and vagina). This screening involves a pelvic examination, including checking for microscopic changes to the surface of your cervix (Pap test). You may be encouraged to have this screening done every 3 years, beginning at age 5.  For women ages 42-65, health care providers may recommend pelvic exams and Pap testing every 3 years, or they may recommend the Pap and pelvic exam, combined with testing for human papilloma virus (HPV), every 5 years. Some types of HPV increase your risk of cervical cancer. Testing for HPV may also be done on women of any age with unclear Pap test results.  Other health care providers  may not recommend any screening for nonpregnant women who are considered low risk for pelvic cancer and who do not have symptoms. Ask your health care provider if a screening pelvic exam is right for you.  If you have had past treatment for cervical cancer or a condition that could lead to cancer, you need Pap tests and screening for cancer for at least 20 years after your treatment. If Pap tests have been discontinued, your risk factors (such as having a new sexual partner) need to be reassessed to determine if screening should resume. Some women have medical problems that increase the chance of getting cervical cancer. In these cases, your health care provider may recommend more frequent screening and Pap tests. Colorectal Cancer  This type of cancer can be detected and often prevented.  Routine colorectal cancer screening usually begins at 75 years of age and continues through 75 years of age.  Your health care provider may recommend screening at an earlier age if you have risk factors for colon cancer.  Your health care  provider may also recommend using home test kits to check for hidden blood in the stool.  A small camera at the end of a tube can be used to examine your colon directly (sigmoidoscopy or colonoscopy). This is done to check for the earliest forms of colorectal cancer.  Routine screening usually begins at age 41.  Direct examination of the colon should be repeated every 5-10 years through 75 years of age. However, you may need to be screened more often if early forms of precancerous polyps or small growths are found. Skin Cancer  Check your skin from head to toe regularly.  Tell your health care provider about any new moles or changes in moles, especially if there is a change in a mole's shape or color.  Also tell your health care provider if you have a mole that is larger than the size of a pencil eraser.  Always use sunscreen. Apply sunscreen liberally and repeatedly  throughout the day.  Protect yourself by wearing long sleeves, pants, a wide-brimmed hat, and sunglasses whenever you are outside. Heart disease, diabetes, and high blood pressure  High blood pressure causes heart disease and increases the risk of stroke. High blood pressure is more likely to develop in:  People who have blood pressure in the high end of the normal range (130-139/85-89 mm Hg).  People who are overweight or obese.  People who are African American.  If you are 75-61 years of age, have your blood pressure checked every 3-5 years. If you are 55 years of age or older, have your blood pressure checked every year. You should have your blood pressure measured twice-once when you are at a hospital or clinic, and once when you are not at a hospital or clinic. Record the average of the two measurements. To check your blood pressure when you are not at a hospital or clinic, you can use:  An automated blood pressure machine at a pharmacy.  A home blood pressure monitor.  If you are between 65 years and 75 years old, ask your health care provider if you should take aspirin to prevent strokes.  Have regular diabetes screenings. This involves taking a blood sample to check your fasting blood sugar level.  If you are at a normal weight and have a low risk for diabetes, have this test once every three years after 75 years of age.  If you are overweight and have a high risk for diabetes, consider being tested at a younger age or more often. Preventing infection Hepatitis B  If you have a higher risk for hepatitis B, you should be screened for this virus. You are considered at high risk for hepatitis B if:  You were born in a country where hepatitis B is common. Ask your health care provider which countries are considered high risk.  Your parents were born in a high-risk country, and you have not been immunized against hepatitis B (hepatitis B vaccine).  You have HIV or AIDS.  You  use needles to inject street drugs.  You live with someone who has hepatitis B.  You have had sex with someone who has hepatitis B.  You get hemodialysis treatment.  You take certain medicines for conditions, including cancer, organ transplantation, and autoimmune conditions. Hepatitis C  Blood testing is recommended for:  Everyone born from [birth year] through 1964.  Anyone with known risk factors for hepatitis C. Sexually transmitted infections (STIs)  You should be screened for sexually transmitted infections (STIs) including gonorrhea  and chlamydia if:  You are sexually active and are younger than 75 years of age.  You are older than 75 years of age and your health care provider tells you that you are at risk for this type of infection.  Your sexual activity has changed since you were last screened and you are at an increased risk for chlamydia or gonorrhea. Ask your health care provider if you are at risk.  If you do not have HIV, but are at risk, it may be recommended that you take a prescription medicine daily to prevent HIV infection. This is called pre-exposure prophylaxis (PrEP). You are considered at risk if:  You are sexually active and do not regularly use condoms or know the HIV status of your partner(s).  You take drugs by injection.  You are sexually active with a partner who has HIV. Talk with your health care provider about whether you are at high risk of being infected with HIV. If you choose to begin PrEP, you should first be tested for HIV. You should then be tested every 3 months for as long as you are taking PrEP. Pregnancy  If you are premenopausal and you may become pregnant, ask your health care provider about preconception counseling.  If you may become pregnant, take 400 to 800 micrograms (mcg) of folic acid every day.  If you want to prevent pregnancy, talk to your health care provider about birth control (contraception). Osteoporosis and  menopause  Osteoporosis is a disease in which the bones lose minerals and strength with aging. This can result in serious bone fractures. Your risk for osteoporosis can be identified using a bone density scan.  If you are 82 years of age or older, or if you are at risk for osteoporosis and fractures, ask your health care provider if you should be screened.  Ask your health care provider whether you should take a calcium or vitamin D supplement to lower your risk for osteoporosis.  Menopause may have certain physical symptoms and risks.  Hormone replacement therapy may reduce some of these symptoms and risks. Talk to your health care provider about whether hormone replacement therapy is right for you. Follow these instructions at home:  Schedule regular health, dental, and eye exams.  Stay current with your immunizations.  Do not use any tobacco products including cigarettes, chewing tobacco, or electronic cigarettes.  If you are pregnant, do not drink alcohol.  If you are breastfeeding, limit how much and how often you drink alcohol.  Limit alcohol intake to no more than 1 drink per day for nonpregnant women. One drink equals 12 ounces of beer, 5 ounces of wine, or 1 ounces of hard liquor.  Do not use street drugs.  Do not share needles.  Ask your health care provider for help if you need support or information about quitting drugs.  Tell your health care provider if you often feel depressed.  Tell your health care provider if you have ever been abused or do not feel safe at home. This information is not intended to replace advice given to you by your health care provider. Make sure you discuss any questions you have with your health care provider. Document Released: 11/04/2010 Document Revised: 09/27/2015 Document Reviewed: 01/23/2015 Elsevier Interactive Patient Education  2017 [employer].

## 2016-08-14 NOTE — Assessment & Plan Note (Signed)
Check fasting lipids 

## 2016-08-14 NOTE — Assessment & Plan Note (Signed)
USPSTF grade A and B recommendations reviewed with patient; age-appropriate recommendations, preventive care, screening tests, etc discussed and encouraged; healthy living encouraged; see AVS for patient education given to patient  

## 2016-08-14 NOTE — Progress Notes (Signed)
Patient: Yvonne Espinoza, Female    DOB: March 09, 1941, 76 y.o.   MRN: [id number]  Visit Date: 08/14/2016  Today's Provider: Enid Derry, MD   Chief Complaint  Patient presents with  . Medicare Wellness    Subjective:   Yvonne Espinoza is a 76 y.o. female who presents today for her Subsequent Annual Wellness Visit.  Caregiver input:  n/a  USPSTF grade A and B recommendations Depression:  Depression screen [facility] 2/9 08/14/2016 02/13/2016 12/31/2015 08/14/2015  Decreased Interest 0 0 0 0  Down, Depressed, Hopeless 0 0 0 0  PHQ - 2 Score 0 0 0 0   Hypertension: BP Readings from Last 3 Encounters:  08/14/16 118/74  02/13/16 116/70  12/31/15 116/80   Obesity: Wt Readings from Last 3 Encounters:  08/14/16 136 lb 3.2 oz (61.8 kg)  02/13/16 133 lb (60.3 kg)  12/31/15 133 lb 9 oz (60.6 kg)   BMI Readings from Last 3 Encounters:  08/14/16 27.35 kg/m  02/13/16 26.70 kg/m  12/31/15 26.82 kg/m   doesn't know why she is putting on weight; she had surgery; saw urologist, maybe has hernia where they did reversal; may go back to him Alcohol: rare Tobacco use: lived with smoker (husband and father) HIV, hep B, hep C: politely declined STD testing and prevention (chl/gon/syphilis): declined Intimate partner violence: no abuse Breast cancer: they just did two mammograms in the last six months at [place] [facility]; last was May 15, 2016 and f/u breast imaging due 4 months later BRCA gene screening: no breast or ovarian cancer; sister had benign breast cyst Cervical cancer screening: remote abnormal paps in the early 1990s; at least 3 normals since then; now 76 yo; saw GYN for that during the time; on meds for heavy periods Osteoporosis: just had one done [facility] [place], everything came back okay; they did bone density test too; patient says it was okay, pt will bring copy Fall prevention/vitamin D: discussed; vit D in multiple vitamin, walks every day Lipids:  Lab  Results  Component Value Date   CHOL 249 (H) 02/13/2016   CHOL 223 (H) 10/02/2015   CHOL 219 (H) 06/28/2015   Lab Results  Component Value Date   HDL 49 02/13/2016   HDL 55 10/02/2015   HDL 59 06/28/2015   Lab Results  Component Value Date   LDLCALC 165 (H) 02/13/2016   LDLCALC 132 (H) 10/02/2015   LDLCALC 139 (H) 06/28/2015   Lab Results  Component Value Date   TRIG 174 (H) 02/13/2016   TRIG 180 (H) 10/02/2015   TRIG 107 06/28/2015   Lab Results  Component Value Date   CHOLHDL 5.1 (H) 02/13/2016   No results found for: LDLDIRECT  Glucose:  Glucose  Date Value Ref Range Status  06/28/2015 85 65 - 99 mg/dL Final   Glucose, Bld  Date Value Ref Range Status  02/13/2016 94 65 - 99 mg/dL Final  04/27/2015 62 (L) 65 - 99 mg/dL Final  04/26/2015 87 65 - 99 mg/dL Final   Glucose-Capillary  Date Value Ref Range Status  06/29/2014 186 (H) 70 - 99 mg/dL Final  06/29/2014 200 (H) 70 - 99 mg/dL Final  06/28/2014 182 (H) 70 - 99 mg/dL Final   Colorectal cancer: 2015, partial colectomy in 2016; she says next due in 10 years Lung cancer:  Never smoker AAA: no fam hx Aspirin: taking 325 mg daily after TIA Diet: not many greens Exercise: getting enough activity Skin cancer: no worrisome  moles; has had some removed; sees derm if needed  HPI  Review of Systems  Gastrointestinal:       Postprandial bloating    Past Medical History:  Diagnosis Date  . Abscess    colon  . Arthritis    RA  . Colovesical fistula    2016  . Diverticulitis   . DNAR (do not attempt resuscitation) 08/14/2016   Discussed in person August 14, 2016  . GERD (gastroesophageal reflux disease)   . Kidney cysts   . SBO (small bowel obstruction) 04/25/2015   partial   . Spinal stenosis   . Stroke [facility])    TIA 2009  . TIA (transient ischemic attack)    2209,2010  . 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 Wears glasses     Past Surgical History:  Procedure Laterality Date  .  CARDIOVASCULAR STRESS TEST  08/08/11 and 06/07/10  . COLOSTOMY REVERSAL  09/26/2014   dr wyatt  . COLOSTOMY REVISION N/A 06/26/2014   Procedure: SIGMOID COLECTOMY WITH BLADDER REPAIR;  Surgeon: Doreen Salvage, MD;  Location: [place];  Service: General;  Laterality: N/A;  . CYSTOSCOPY W/ URETERAL STENT PLACEMENT Left 06/26/2014   Procedure: BOARI FLAP LEFT URETERAL REIMPLANT;  Surgeon: Jorja Loa, MD;  Location: [place];  Service: Urology;  Laterality: Left;  . DILATION AND CURETTAGE OF UTERUS    . ESOPHAGEAL DILATION  10/10/08  . FLEXIBLE SIGMOIDOSCOPY N/A 09/26/2014   Procedure: RIGID SIGMOIDOSCOPY;  Surgeon: Judeth Horn, MD;  Location: [place];  Service: General;  Laterality: N/A;  . ILEOSTOMY N/A 06/26/2014   Procedure: LOOP ILEOSTOMY;  Surgeon: Doreen Salvage, MD;  Location: [place];  Service: General;  Laterality: N/A;  . KNEE ARTHROSCOPY Bilateral   . SHOULDER SURGERY     right 2010  . TRANSVERSE LOOP COLOSTOMY N/A 09/26/2014   Procedure: LOOP COLOSTOMY REVERESAL;  Surgeon: Judeth Horn, MD;  [facility];  Service: General;  Laterality: N/A;    Family History  Problem Relation Age of Onset  . Diabetes Mother     pre dm  . Diabetes Father   . Heart attack Father   . Heart disease Father   . Diabetes Sister   . Stroke Sister   . Glaucoma Brother   . Diabetes Brother   . Diabetes Sister   . Hypertension Son   . Hyperlipidemia Son   . Diabetes Son   . Stroke Maternal Aunt   . Diabetes Maternal Grandfather   . Cancer Neg Hx   . COPD Neg Hx   . Breast cancer Neg Hx     Social History   Social History  . Marital status: Married    Spouse name: N/A  . Number of children: N/A  . Years of education: N/A   Occupational History  . Not on file.   Social History Main Topics  . Smoking status: Never Smoker  . Smokeless tobacco: Never Used  . Alcohol use Yes     Comment: "rarely"  . Drug use: No  . Sexual activity: Not on file   Other Topics Concern  . Not on file   Social History  Narrative  . No narrative on file    Outpatient Encounter Prescriptions as of 08/14/2016  Medication Sig  . aspirin EC 325 MG tablet Take 325 mg by mouth daily.  . Coenzyme Q10 (CO Q-10) 100 MG CAPS Take 100 mg by mouth daily.  . folic acid (FOLVITE) 1 MG tablet Take 1  mg by mouth daily.   . Lutein 40 MG CAPS Take 40 mg by mouth daily.  . Multiple Vitamin (MULTIVITAMIN WITH MINERALS) TABS tablet Take 1 tablet by mouth daily.  Marland Kitchen triamcinolone cream (KENALOG) 0.1 % Apply 1 application topically 2 (two) times daily. Mix one to one with gentle moisturizing lotion; as needed  . [DISCONTINUED] BIOTIN PO Take 10,000 mg by mouth daily.  . [DISCONTINUED] triamcinolone cream (KENALOG) 0.1 % Apply 1 application topically 2 (two) times daily. Mix one to one with gentle moisturizing lotion; as needed   No facility-administered encounter medications on file as of 08/14/2016.     Functional Ability / Safety Screening 1.  Was the timed Get Up and Go test longer than 30 seconds?  no 2.  Does the patient need help with the phone, transportation, shopping,      preparing meals, housework, laundry, medications, or managing money?  no 3.  Does the patient's home have:  loose throw rugs in the hallway?   no      Grab bars in the bathroom? no      Handrails on the stairs?   yes      Poor lighting?   no 4.  Has the patient noticed any hearing difficulties?   no  Fall Risk Assessment See under rooming  Depression Screen See under rooming Depression screen [facility] 2/9 08/14/2016 02/13/2016 12/31/2015 08/14/2015  Decreased Interest 0 0 0 0  Down, Depressed, Hopeless 0 0 0 0  PHQ - 2 Score 0 0 0 0    Advanced Directives Does patient have a HCPOA?    yes If yes, name and contact information: Ed, same number; back-up not sure if Ludwig Clarks or daughter Does patient have a living will or MOST form?  yes  Objective:   Vitals: BP 118/74   Pulse 76   Temp 97.5 F (36.4 C) (Oral)   Resp 14   Wt 136 lb 3.2 oz (61.8  kg)   SpO2 96%   BMI 27.35 kg/m  Body mass index is 27.35 kg/m. No exam data present  Physical Exam Mood/affect:  Euthymic and pleasant Appearance:  Casually dressed  6CIT Screen 08/14/2016  What Year? 0 points  What month? 0 points  What time? 0 points  Count back from 20 0 points  Months in reverse 4 points  Repeat phrase 0 points  Total Score 4    Assessment & Plan:     Annual Wellness Visit  Reviewed patient's Family Medical History Reviewed and updated list of patient's medical providers Assessment of cognitive impairment was done Assessed patient's functional ability Established a written schedule for health screening [place] Completed and Reviewed  Exercise Activities and Dietary recommendations Goals    None    work on 3-5 pounds   Immunization History  Administered Date(s) Administered  . Influenza, High Dose Seasonal PF 01/21/2016  . Influenza-Unspecified 03/19/2015  . Pneumococcal Polysaccharide-23 05/05/2013  . Td 05/05/2013  . Zoster 05/05/2014  pneumonia vaccine given at [place] [place] Maintenance  Topic Date Due  . PNA vac Low Risk Adult (2 of 2 - PCV13) 05/05/2014  . MAMMOGRAM  10/01/2016  . INFLUENZA VACCINE  12/03/2016  . TETANUS/TDAP  05/06/2023  . COLONOSCOPY  04/04/2024  . DEXA SCAN  Addressed    Discussed health benefits of physical activity, and encouraged her to engage in regular exercise appropriate for her age and condition.   Meds ordered this encounter  Medications  .  triamcinolone cream (KENALOG) 0.1 %    Sig: Apply 1 application topically 2 (two) times daily. Mix one to one with gentle moisturizing lotion; as needed    Dispense:  45 g    Refill:  1    Current Outpatient Prescriptions:  .  aspirin EC 325 MG tablet, Take 325 mg by mouth daily., Disp: , Rfl:  .  Coenzyme Q10 (CO Q-10) 100 MG CAPS, Take 100 mg by mouth daily., Disp: , Rfl:  .  folic acid (FOLVITE) 1 MG tablet, Take 1 mg  by mouth daily. , Disp: , Rfl:  .  Lutein 40 MG CAPS, Take 40 mg by mouth daily., Disp: , Rfl:  .  Multiple Vitamin (MULTIVITAMIN WITH MINERALS) TABS tablet, Take 1 tablet by mouth daily., Disp: , Rfl:  .  triamcinolone cream (KENALOG) 0.1 %, Apply 1 application topically 2 (two) times daily. Mix one to one with gentle moisturizing lotion; as needed, Disp: 45 g, Rfl: 1 Medications Discontinued During This Encounter  Medication Reason  . BIOTIN PO   . triamcinolone cream (KENALOG) 0.1 % Reorder   Problem List Items Addressed This Visit      Other   Proteins serum plasma low    Check labs      Relevant Orders   COMPLETE METABOLIC PANEL WITH GFR (Completed)   Preventative health care - Primary    USPSTF grade A and B recommendations reviewed with patient; age-appropriate recommendations, preventive care, screening tests, etc discussed and encouraged; healthy living encouraged; see AVS for patient education given to patient       Medication monitoring encounter    Monitor CBC and Cr on aspirin      Relevant Orders   CBC with Differential/Platelet (Completed)   COMPLETE METABOLIC PANEL WITH GFR (Completed)   Hyperlipidemia (Chronic)    Check fasting lipids      Relevant Orders   Lipid panel (Completed)   DNAR (do not attempt resuscitation)    discussed       Other Visit Diagnoses    Postprandial abdominal bloating       Relevant Orders   US Pelvis Complete   US Transvaginal Non-OB   Breast calcification, left       Relevant Orders   MM Digital Diagnostic Bilat   US BREAST LTD UNI LEFT INC AXILLA   US BREAST LTD UNI RIGHT INC AXILLA       Next Medicare Wellness Visit in 12+ months

## 2016-08-14 NOTE — Assessment & Plan Note (Signed)
discussed

## 2016-08-14 NOTE — Assessment & Plan Note (Signed)
Monitor CBC and Cr on aspirin

## 2016-08-15 ENCOUNTER — Telehealth: Payer: Self-pay

## 2016-08-15 LAB — COMPLETE METABOLIC PANEL WITH GFR
ALT: 22 U/L (ref 6–29)
AST: 21 U/L (ref 10–35)
Albumin: 4.1 g/dL (ref 3.6–5.1)
Alkaline Phosphatase: 72 U/L (ref 33–130)
BUN: 18 mg/dL (ref 7–25)
CHLORIDE: 103 mmol/L (ref 98–110)
CO2: 28 mmol/L (ref 20–31)
Calcium: 9.8 mg/dL (ref 8.6–10.4)
Creat: 0.85 mg/dL (ref 0.60–0.93)
GFR, EST NON AFRICAN AMERICAN: 68 mL/min (ref >=60)
GFR, Est African American: 78 mL/min (ref >=60)
GLUCOSE: 89 mg/dL (ref 65–99)
POTASSIUM: 4.7 mmol/L (ref 3.5–5.3)
SODIUM: 142 mmol/L (ref 135–146)
Total Bilirubin: 0.4 mg/dL (ref 0.2–1.2)
Total Protein: 6.7 g/dL (ref 6.1–8.1)

## 2016-08-15 LAB — LIPID PANEL
CHOL/HDL RATIO: 4.3 ratio (ref <5.0)
Cholesterol: 247 mg/dL — ABNORMAL HIGH (ref <200)
HDL: 57 mg/dL (ref >50)
LDL CALC: 159 mg/dL — AB (ref <100)
Triglycerides: 157 mg/dL — ABNORMAL HIGH (ref <150)
VLDL: 31 mg/dL — ABNORMAL HIGH (ref <30)

## 2016-08-15 NOTE — Telephone Encounter (Signed)
Patient was informed that she has been scheduled to have her [place] on Tuesday, August 19, 2016 @ 2:30pm at the [facility]. Patient was informed that she is to arrive 15 mins early and to drink 32 oz of water 1 hr prior without using the restroom.

## 2016-08-19 ENCOUNTER — Ambulatory Visit
Admission: RE | Admit: 2016-08-19 | Discharge: 2016-08-19 | Disposition: A | Discharge status: Home / Self Care | Payer: Medicare HMO | Source: Ambulatory Visit | Attending: Family Medicine | Admitting: Family Medicine

## 2016-08-19 DIAGNOSIS — R140 Abdominal distension (gaseous): Secondary | ICD-10-CM | POA: Insufficient documentation

## 2016-08-21 ENCOUNTER — Ambulatory Visit (INDEPENDENT_AMBULATORY_CARE_PROVIDER_SITE_OTHER): Payer: Medicare HMO | Admitting: Family Medicine

## 2016-08-21 ENCOUNTER — Encounter: Payer: Self-pay | Admitting: Family Medicine

## 2016-08-21 DIAGNOSIS — Z8673 Personal history of transient ischemic attack (TIA), and cerebral infarction without residual deficits: Secondary | ICD-10-CM | POA: Diagnosis not present

## 2016-08-21 DIAGNOSIS — I6523 Occlusion and stenosis of bilateral carotid arteries: Secondary | ICD-10-CM

## 2016-08-21 DIAGNOSIS — R1310 Dysphagia, unspecified: Secondary | ICD-10-CM | POA: Diagnosis not present

## 2016-08-21 DIAGNOSIS — E782 Mixed hyperlipidemia: Secondary | ICD-10-CM | POA: Diagnosis not present

## 2016-08-21 DIAGNOSIS — R1319 Other dysphagia: Secondary | ICD-10-CM

## 2016-08-21 DIAGNOSIS — M542 Cervicalgia: Secondary | ICD-10-CM | POA: Diagnosis not present

## 2016-08-21 DIAGNOSIS — I10 Essential (primary) hypertension: Secondary | ICD-10-CM | POA: Diagnosis not present

## 2016-08-21 NOTE — Assessment & Plan Note (Signed)
Discussed ddx; CTA neck

## 2016-08-21 NOTE — Assessment & Plan Note (Signed)
Check CTA neck

## 2016-08-21 NOTE — Patient Instructions (Addendum)
We'll have you see the gastroenterologist We'll get a scan of your neck Try to limit saturated fats in your diet (bologna, hot dogs, barbeque, cheeseburgers, hamburgers, steak, bacon, sausage, cheese, etc.) and get more fresh fruits, vegetables, and whole grains Return in 3 months for fasting labs only

## 2016-08-21 NOTE — Assessment & Plan Note (Signed)
Well controlled today.

## 2016-08-21 NOTE — Assessment & Plan Note (Signed)
Mild atherosclerosis showed up on screening test through [facility] in Feb 2018; will order CTA since she is also having neck symptoms and has a hx of TIA

## 2016-08-21 NOTE — Progress Notes (Signed)
BP 116/72   Pulse 85   Temp 98 F (36.7 C) (Oral)   Resp 14   Wt 136 lb 3.2 oz (61.8 kg)   SpO2 95%   BMI 27.35 kg/m    Subjective:    Patient ID: Yvonne Espinoza, female    DOB: May 01, 1941, 76 y.o.   MRN: [id number]  HPI: Yvonne Espinoza is a 76 y.o. female  Chief Complaint  Patient presents with  . Results    Discuss [place] results   . Follow-up    1 week     HPI She is having shooting pain going up the left side of the neck, goes up into the head; almost affects her eye and makes her almost not even want t move; from neck and feels it shoots up; almost paralyzing pain; rubs her neck or stands still and it goes away; lasts just a second or two; like a tight muscle; shoots up into the brain; no vision changes, but can feel a tightness in the eye (left); she has had this for several years; seems like it is getting worse; never knows when it going to happen; can be in the shower or just in a chair or walking in the kitchen; no change in voice or ability to swallow; has had her throat stretched 3 times, but now feeling throat closing and has to cough; that does not coincide with the pain; no numbness going down the arm  She had a pain in her back on Tuesday; that was just on the right getting up out of the chair on Tuesday, stabbing on the right; no blood in the urine; lasted for a few seconds and went away; just getting up out of the chair  She had the US done a few days ago for bloating; she has a long hx of GI issues and that was done to r/o ovarian cancer Transvag US done 08/19/2016 IMPRESSION: Essentially negative pelvic ultrasound  Electronically Signed   By: Donavan Foil M.D.   On: 08/19/2016 20:54  She had a carotid scan which showed mild disease; done in February 2018; reviewed with patient  Started back on Zetia; trying to watch a good diet  More reflux, hx of esophageal stricture dilatation x 3; more symptoms now  Depression screen [facility] 2/9 08/21/2016 08/14/2016  02/13/2016 12/31/2015 08/14/2015  Decreased Interest 0 0 0 0 0  Down, Depressed, Hopeless 0 0 0 0 0  PHQ - 2 Score 0 0 0 0 0   Relevant past medical, surgical, family and social history reviewed Past Medical History:  Diagnosis Date  . Abscess    colon  . Arthritis    RA  . Colovesical fistula    2016  . Diverticulitis   . DNAR (do not attempt resuscitation) 08/14/2016   Discussed in person August 14, 2016  . GERD (gastroesophageal reflux disease)   . Kidney cysts   . SBO (small bowel obstruction) ([place]) 04/25/2015   partial   . Spinal stenosis   . Stroke [facility])    TIA 2009  . TIA (transient ischemic attack)    2209,2010  . 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 Wears glasses    Past Surgical History:  Procedure Laterality Date  . CARDIOVASCULAR STRESS TEST  08/08/11 and 06/07/10  . COLOSTOMY REVERSAL  09/26/2014   dr wyatt  . COLOSTOMY REVISION N/A 06/26/2014   Procedure: SIGMOID COLECTOMY WITH BLADDER REPAIR;  Surgeon: Doreen Salvage, MD;  Location: [place];  Service: General;  Laterality: N/A;  . CYSTOSCOPY W/ URETERAL STENT PLACEMENT Left 06/26/2014   Procedure: BOARI FLAP LEFT URETERAL REIMPLANT;  Surgeon: Jorja Loa, MD;  Location: [place];  Service: Urology;  Laterality: Left;  . DILATION AND CURETTAGE OF UTERUS    . ESOPHAGEAL DILATION  10/10/08  . FLEXIBLE SIGMOIDOSCOPY N/A 09/26/2014   Procedure: RIGID SIGMOIDOSCOPY;  Surgeon: Judeth Horn, MD;  Location: [place];  Service: General;  Laterality: N/A;  . ILEOSTOMY N/A 06/26/2014   Procedure: LOOP ILEOSTOMY;  Surgeon: Doreen Salvage, MD;  Location: [place];  Service: General;  Laterality: N/A;  . KNEE ARTHROSCOPY Bilateral   . SHOULDER SURGERY     right 2010  . TRANSVERSE LOOP COLOSTOMY N/A 09/26/2014   Procedure: LOOP COLOSTOMY REVERESAL;  Surgeon: Judeth Horn, MD;  [facility];  Service: General;  Laterality: N/A;   Family History  Problem Relation Age of Onset  . Diabetes Mother     pre dm  . Diabetes Father   .  Heart attack Father   . Heart disease Father   . Diabetes Sister   . Stroke Sister   . Glaucoma Brother   . Diabetes Brother   . Diabetes Sister   . Hypertension Son   . Hyperlipidemia Son   . Diabetes Son   . Stroke Maternal Aunt   . Diabetes Maternal Grandfather   . Cancer Neg Hx   . COPD Neg Hx   . Breast cancer Neg Hx    Social History  Substance Use Topics  . Smoking status: Never Smoker  . Smokeless tobacco: Never Used  . Alcohol use Yes     Comment: "rarely"    Interim medical history since last visit reviewed. Allergies and medications reviewed  Review of Systems Per HPI unless specifically indicated above     Objective:    BP 116/72   Pulse 85   Temp 98 F (36.7 C) (Oral)   Resp 14   Wt 136 lb 3.2 oz (61.8 kg)   SpO2 95%   BMI 27.35 kg/m   Wt Readings from Last 3 Encounters:  08/21/16 136 lb 3.2 oz (61.8 kg)  08/14/16 136 lb 3.2 oz (61.8 kg)  02/13/16 133 lb (60.3 kg)    Physical Exam  Constitutional: She appears well-developed and well-nourished. No distress.  HENT:  Head: Normocephalic and atraumatic.  Right Ear: Tympanic membrane, external ear and ear canal normal. No swelling. Tympanic membrane is not erythematous and not retracted. No middle ear effusion. No decreased hearing is noted.  Left Ear: Tympanic membrane, external ear and ear canal normal. No swelling. Tympanic membrane is not erythematous and not retracted.  No middle ear effusion. No decreased hearing is noted.  Nose: No rhinorrhea.  Mouth/Throat: Oropharynx is clear and moist and mucous membranes are normal. She has dentures.  Eyes: EOM are normal. No scleral icterus.  Neck: No thyromegaly present.  Cardiovascular: Normal rate, regular rhythm and normal heart sounds.   No murmur heard. Pulmonary/Chest: Effort normal and breath sounds normal. No respiratory distress. She has no wheezes.  Abdominal: Soft. Bowel sounds are normal. She exhibits no distension.  Musculoskeletal: Normal  range of motion. She exhibits no edema.  Lymphadenopathy:       Head (right side): No preauricular, no posterior auricular and no occipital adenopathy present.       Head (left side): No preauricular, no posterior auricular and no occipital adenopathy present.    She has no cervical adenopathy.  Right: No supraclavicular adenopathy present.       Left: No supraclavicular adenopathy present.  Neurological: She is alert. She displays no tremor. She exhibits normal muscle tone.  UE strength 5/5  Skin: Skin is warm and dry. No rash (specifically, no rash over the left side face, neck, trap) noted. She is not diaphoretic. No pallor.  Psychiatric: She has a normal mood and affect. Her behavior is normal. Judgment and thought content normal. Her mood appears not anxious. She does not exhibit a depressed mood.    Results for orders placed or performed in visit on 08/14/16  CBC with Differential/Platelet  Result Value Ref Range   WBC 7.3 3.8 - 10.8 K/uL   RBC 4.87 3.80 - 5.10 MIL/uL   Hemoglobin 14.6 11.7 - 15.5 g/dL   HCT 44.6 35.0 - 45.0 %   MCV 91.6 80.0 - 100.0 fL   MCH 30.0 27.0 - 33.0 pg   MCHC 32.7 32.0 - 36.0 g/dL   RDW 14.9 11.0 - 15.0 %   Platelets 298 140 - 400 K/uL   MPV 9.6 7.5 - 12.5 fL   Neutro Abs 3,869 1,500 - 7,800 cells/uL   Lymphs Abs 2,263 850 - 3,900 cells/uL   Monocytes Absolute 803 200 - 950 cells/uL   Eosinophils Absolute 365 15 - 500 cells/uL   Basophils Absolute 0 0 - 200 cells/uL   Neutrophils Relative % 53 %   Lymphocytes Relative 31 %   Monocytes Relative 11 %   Eosinophils Relative 5 %   Basophils Relative 0 %   Smear Review Criteria for review not met   COMPLETE METABOLIC PANEL WITH GFR  Result Value Ref Range   Sodium 142 135 - 146 mmol/L   Potassium 4.7 3.5 - 5.3 mmol/L   Chloride 103 98 - 110 mmol/L   CO2 28 20 - 31 mmol/L   Glucose, Bld 89 65 - 99 mg/dL   BUN 18 7 - 25 mg/dL   Creat 0.85 0.60 - 0.93 mg/dL   Total Bilirubin 0.4 0.2 - 1.2  mg/dL   Alkaline Phosphatase 72 33 - 130 U/L   AST 21 10 - 35 U/L   ALT 22 6 - 29 U/L   Total Protein 6.7 6.1 - 8.1 g/dL   Albumin 4.1 3.6 - 5.1 g/dL   Calcium 9.8 8.6 - 10.4 mg/dL   GFR, Est African American 78 >=60 mL/min   GFR, Est Non African American 68 >=60 mL/min  Lipid panel  Result Value Ref Range   Cholesterol 247 (H) <200 mg/dL   Triglycerides 157 (H) <150 mg/dL   HDL 57 >50 mg/dL   Total CHOL/HDL Ratio 4.3 <5.0 Ratio   VLDL 31 (H) <30 mg/dL   LDL Cholesterol 159 (H) <100 mg/dL      Assessment & Plan:   Problem List Items Addressed This Visit      Cardiovascular and Mediastinum   Essential hypertension, benign (Chronic)    Well-controlled today      Relevant Medications   ezetimibe (ZETIA) 10 MG tablet   Carotid atherosclerosis, bilateral    Mild atherosclerosis showed up on screening test through [facility] in Feb 2018; will order CTA since she is also having neck symptoms and has a hx of TIA      Relevant Medications   ezetimibe (ZETIA) 10 MG tablet   Other Relevant Orders   CT Soft Tissue Neck W Contrast     Digestive   Dysphagia    Refer  to GI      Relevant Orders   Ambulatory referral to Gastroenterology     Other   Neck pain on left side    Discussed ddx; CTA neck      Relevant Orders   CT Soft Tissue Neck W Contrast   Hyperlipidemia (Chronic)    Started back on zetia; intolerant to statins; recheck fasting cholesterol in 3 months      Relevant Medications   ezetimibe (ZETIA) 10 MG tablet   Hx-TIA (transient ischemic attack) (Chronic)    Check CTA neck      Relevant Orders   CT Soft Tissue Neck W Contrast       Follow up plan: Return in about 3 months (around 11/20/2016) for fasting labs only.  An after-visit summary was printed and given to the patient at [place].  Please see the patient instructions which may contain other information and recommendations beyond what is mentioned above in the assessment and plan.  Meds ordered  this encounter  Medications  . ezetimibe (ZETIA) 10 MG tablet    Sig: Take 10 mg by mouth daily with breakfast.    Orders Placed This Encounter  Procedures  . CT Soft Tissue Neck W Contrast  . Ambulatory referral to Gastroenterology

## 2016-08-21 NOTE — Assessment & Plan Note (Signed)
Started back on zetia; intolerant to statins; recheck fasting cholesterol in 3 months

## 2016-08-21 NOTE — Assessment & Plan Note (Signed)
Refer to GI 

## 2016-08-27 ENCOUNTER — Telehealth: Payer: Self-pay | Admitting: Family Medicine

## 2016-08-27 NOTE — Telephone Encounter (Signed)
-----   Message from Kerman Passey, MD sent at 05/22/2016 12:56 PM EST ----- Regarding: due for additional breast images in May RECOMMENDATION: Bilateral diagnostic mammography with left breast magnification views 09/2016.

## 2016-08-29 ENCOUNTER — Ambulatory Visit: Payer: Medicare HMO

## 2016-09-04 ENCOUNTER — Ambulatory Visit
Admission: RE | Admit: 2016-09-04 | Discharge: 2016-09-04 | Disposition: A | Discharge status: Home / Self Care | Payer: Medicare HMO | Source: Ambulatory Visit | Attending: Family Medicine | Admitting: Family Medicine

## 2016-09-04 DIAGNOSIS — M542 Cervicalgia: Secondary | ICD-10-CM | POA: Insufficient documentation

## 2016-09-04 DIAGNOSIS — I700 Atherosclerosis of aorta: Secondary | ICD-10-CM | POA: Insufficient documentation

## 2016-09-04 DIAGNOSIS — M47812 Spondylosis without myelopathy or radiculopathy, cervical region: Secondary | ICD-10-CM | POA: Insufficient documentation

## 2016-09-04 DIAGNOSIS — R918 Other nonspecific abnormal finding of lung field: Secondary | ICD-10-CM | POA: Diagnosis not present

## 2016-09-04 DIAGNOSIS — M4802 Spinal stenosis, cervical region: Secondary | ICD-10-CM | POA: Insufficient documentation

## 2016-09-04 DIAGNOSIS — I6523 Occlusion and stenosis of bilateral carotid arteries: Secondary | ICD-10-CM

## 2016-09-04 DIAGNOSIS — Z8673 Personal history of transient ischemic attack (TIA), and cerebral infarction without residual deficits: Secondary | ICD-10-CM

## 2016-09-04 DIAGNOSIS — R1310 Dysphagia, unspecified: Secondary | ICD-10-CM | POA: Diagnosis not present

## 2016-09-04 HISTORY — DX: Systemic involvement of connective tissue, unspecified: M35.9

## 2016-09-04 MED ORDER — IOPAMIDOL (ISOVUE-300) INJECTION 61%
75.0000 mL | Freq: Once | INTRAVENOUS | Status: AC | PRN
Start: 2016-09-04 — End: 2016-09-04
  Administered 2016-09-04: 75 mL via INTRAVENOUS

## 2016-09-08 ENCOUNTER — Encounter: Payer: Self-pay | Admitting: Family Medicine

## 2016-09-08 DIAGNOSIS — R911 Solitary pulmonary nodule: Secondary | ICD-10-CM | POA: Insufficient documentation

## 2016-09-08 DIAGNOSIS — M4802 Spinal stenosis, cervical region: Secondary | ICD-10-CM

## 2016-09-08 DIAGNOSIS — I700 Atherosclerosis of aorta: Secondary | ICD-10-CM | POA: Insufficient documentation

## 2016-09-08 DIAGNOSIS — IMO0001 Reserved for inherently not codable concepts without codable children: Secondary | ICD-10-CM

## 2016-09-08 HISTORY — DX: Solitary pulmonary nodule: R91.1

## 2016-09-08 HISTORY — DX: Reserved for inherently not codable concepts without codable children: IMO0001

## 2016-09-08 HISTORY — DX: Spinal stenosis, cervical region: M48.02

## 2016-09-08 HISTORY — DX: Atherosclerosis of aorta: I70.0

## 2016-09-26 ENCOUNTER — Telehealth: Payer: Self-pay | Admitting: Family Medicine

## 2016-09-26 MED ORDER — BENZONATATE 100 MG PO CAPS: 100.0000 mg | ORAL_CAPSULE | Freq: Three times a day (TID) | ORAL | 0 refills | Status: DC | PRN

## 2016-09-26 NOTE — Telephone Encounter (Signed)
I called, no antibiotics unless seen She says she has what Yvonne Espinoza has last Wed Sick for the last week, coughing up phlegm, green and yellow Chest congestion No wheezing or trouble breathing No SHOB, can breathe alright Tessalon perles offered Rest, hydration, get checked out if worse

## 2016-09-26 NOTE — Telephone Encounter (Signed)
Pt states that her son Ed Mawhoor was seen last week and she has now developed what he has: fever, phylem, yellow mucus, cough, and head congestion which all began last week. Have tried otc medication which is not helping. Asking that you send a prescription [facility] [phone]

## 2016-09-30 ENCOUNTER — Ambulatory Visit (INDEPENDENT_AMBULATORY_CARE_PROVIDER_SITE_OTHER): Payer: Medicare HMO | Admitting: Gastroenterology

## 2016-09-30 ENCOUNTER — Other Ambulatory Visit: Payer: Self-pay

## 2016-09-30 ENCOUNTER — Encounter: Payer: Self-pay | Admitting: Gastroenterology

## 2016-09-30 VITALS — BP 143/77 | HR 71 | Temp 98.2°F | Ht 60.0 in | Wt 133.6 lb

## 2016-09-30 DIAGNOSIS — R1319 Other dysphagia: Secondary | ICD-10-CM

## 2016-09-30 DIAGNOSIS — R1310 Dysphagia, unspecified: Secondary | ICD-10-CM

## 2016-09-30 MED ORDER — RANITIDINE HCL 150 MG PO TABS: 150.0000 mg | ORAL_TABLET | Freq: Two times a day (BID) | ORAL | 3 refills | Status: DC

## 2016-09-30 NOTE — Progress Notes (Signed)
[name] MD, MRCP(U.K) [address]  [address]  [place], [place] [address]  Main: [phone]  Fax: [phone]   Gastroenterology Consultation  Referring Provider:     Kerman Passey, MD Primary Care Physician:  Kerman Passey, MD Primary Gastroenterologist:  Dr. [name]  Reason for Consultation:     Dysphagia         HPI:   Lailynn Riling Ignatowski is a 76 y.o. y/o female referred for consultation & management  by Dr. Sherie Don, [name], MD.    She has been referred for difficulty with swallowing   Dysphagia: Onset and any progression: ongoing for 6 months, been getting worse, when she eats, feels the food sits in her chest , also feels she has "indigestion more often", drinking hot tea helps passage of food.  Frequency: not every day -sporadic  Foods affected : solid foods  Prior episodes of impaction: no  History of asthma/allergy : she is allergic to pollen , dandruff of animals History of heartburn/Reflux : yes- she has a hiatal hernia   Weight loss/weight gain : stable   Prior EGD: yes ,dilations x 3  PPI/H2 blocker use : TUMS-, no heartburn .  She is on asprin 325 mg .     Past Medical History:  Diagnosis Date  . Abscess    colon  . Arthritis    RA  . Collagen vascular disease (HCC)    Rhematoid Arthritis  . Colovesical fistula    2016  . Degenerative cervical spinal stenosis 09/08/2016  . Diverticulitis   . DNAR (do not attempt resuscitation) 08/14/2016   Discussed in person August 14, 2016  . GERD (gastroesophageal reflux disease)   . Kidney cysts   . Lung nodule < 6cm on CT 09/08/2016  . SBO (small bowel obstruction) (HCC) 04/25/2015   partial   . Spinal stenosis   . Stroke [facility])    TIA 2009  . Thoracic aortic atherosclerosis (HCC) 09/08/2016  . TIA (transient ischemic attack)    2209,2010  . 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 Wears glasses     Past Surgical History:  Procedure Laterality Date  . CARDIOVASCULAR STRESS TEST   08/08/11 and 06/07/10  . COLOSTOMY REVERSAL  09/26/2014   dr wyatt  . COLOSTOMY REVISION N/A 06/26/2014   Procedure: SIGMOID COLECTOMY WITH BLADDER REPAIR;  Surgeon: Frederik Schmidt, MD;  Location: [facility] OR;  Service: General;  Laterality: N/A;  . CYSTOSCOPY W/ URETERAL STENT PLACEMENT Left 06/26/2014   Procedure: BOARI FLAP LEFT URETERAL REIMPLANT;  Surgeon: Chelsea Aus, MD;  Location: [facility] OR;  Service: Urology;  Laterality: Left;  . DILATION AND CURETTAGE OF UTERUS    . ESOPHAGEAL DILATION  10/10/08  . FLEXIBLE SIGMOIDOSCOPY N/A 09/26/2014   Procedure: RIGID SIGMOIDOSCOPY;  Surgeon: Jimmye Norman, MD;  Location: [facility] OR;  Service: General;  Laterality: N/A;  . ILEOSTOMY N/A 06/26/2014   Procedure: LOOP ILEOSTOMY;  Surgeon: Frederik Schmidt, MD;  Location: [facility] OR;  Service: General;  Laterality: N/A;  . KNEE ARTHROSCOPY Bilateral   . SHOULDER SURGERY     right 2010  . TRANSVERSE LOOP COLOSTOMY N/A 09/26/2014   Procedure: LOOP COLOSTOMY REVERESAL;  Surgeon: Jimmye Norman, MD;  Location: [facility] OR;  Service: General;  Laterality: N/A;    Prior to Admission medications   Medication Sig Start Date End Date Taking? Authorizing Provider  aspirin EC 325 MG tablet Take 325 mg by mouth daily.    [provider]  benzonatate (TESSALON PERLES) 100 MG capsule Take 1 capsule (100 mg total) by mouth every 8 (eight) hours as needed for cough. 09/26/16   Kerman Passey, MD  Coenzyme Q10 (CO Q-10) 100 MG CAPS Take 100 mg by mouth daily.    [provider]  ezetimibe (ZETIA) 10 MG tablet Take 10 mg by mouth daily with breakfast.    [provider]  folic acid (FOLVITE) 1 MG tablet Take 1 mg by mouth daily.     [provider]  Lutein 40 MG CAPS Take 40 mg by mouth daily.    [provider]  Multiple Vitamin (MULTIVITAMIN WITH MINERALS) TABS tablet Take 1 tablet by mouth daily.    [provider]  triamcinolone cream (KENALOG) 0.1 % Apply 1 application topically 2 (two) times daily.  Mix one to one with gentle moisturizing lotion; as needed 08/14/16   Kerman Passey, MD    Family History  Problem Relation Age of Onset  . Diabetes Mother        pre dm  . Diabetes Father   . Heart attack Father   . Heart disease Father   . Diabetes Sister   . Stroke Sister   . Glaucoma Brother   . Diabetes Brother   . Diabetes Sister   . Hypertension Son   . Hyperlipidemia Son   . Diabetes Son   . Stroke Maternal Aunt   . Diabetes Maternal Grandfather   . Cancer Neg Hx   . COPD Neg Hx   . Breast cancer Neg Hx      Social History  Substance Use Topics  . Smoking status: Never Smoker  . Smokeless tobacco: Never Used  . Alcohol use Yes     Comment: "rarely"    Allergies as of 09/30/2016 - Review Complete 08/21/2016  Allergen Reaction Noted  . Gabapentin Other (See Comments) 05/01/2014  . Neosporin [neomycin-bacitracin zn-polymyx]  05/01/2014  . Prednisone Other (See Comments) 05/01/2014  . Statins Other (See Comments) 04/20/2015    Review of Systems:    All systems reviewed and negative except where noted in HPI.   Physical Exam:  BP (!) 143/77 (BP Location: Right Arm, Patient Position: Sitting, Cuff Size: Normal)   Pulse 71   Temp 98.2 F (36.8 C) (Oral)   Ht 5' (1.524 m)   Wt 133 lb 9.6 oz (60.6 kg)   BMI 26.09 kg/m  No LMP recorded. Patient is postmenopausal. Psych:  Alert and cooperative. Normal mood and affect. General:   Alert,  Well-developed, well-nourished, pleasant and cooperative in NAD Head:  Normocephalic and atraumatic. Eyes:  Sclera clear, no icterus.   Conjunctiva pink. Ears:  Normal auditory acuity. Nose:  No deformity, discharge, or lesions. Mouth:  No deformity or lesions,oropharynx pink & moist. Neck:  Supple; no masses or thyromegaly. Lungs:  Respirations even and unlabored.  Clear throughout to auscultation.   No wheezes, crackles, or rhonchi. No acute distress. Heart:  Regular rate and rhythm; no murmurs, clicks, rubs, or  gallops. Abdomen:  Normal bowel sounds.  No bruits.  Soft, non-tender and non-distended without masses, hepatosplenomegaly or hernias noted.  No guarding or rebound tenderness.    Pulses:  Normal pulses noted. Extremities:  No clubbing or edema.  No cyanosis. Neurologic:  Alert and oriented x3;  grossly normal neurologically. Psych:  Alert and cooperative. Normal mood and affect.  Imaging Studies: Ct Soft Tissue Neck W Contrast  Result Date: 09/04/2016 CLINICAL DATA:  76 y/o F;  left-sided neck spasms intermittently for 1-2 years with dysphagia. EXAM: CT NECK WITH CONTRAST TECHNIQUE: Multidetector CT imaging of the neck was performed using the standard protocol following the bolus administration of intravenous contrast. CONTRAST:  62mL ISOVUE-300 IOPAMIDOL (ISOVUE-300) INJECTION 61% COMPARISON:  None. FINDINGS: Pharynx and larynx: Normal. No mass or swelling identified. Salivary glands: No inflammation, mass, or stone. Thyroid: Normal. Lymph nodes: None enlarged or abnormal density. Vascular: Mild calcific atherosclerosis of the aortic arch. Calcified plaque of the bilateral carotid bifurcations without hemodynamically significant stenosis. Patent vertebral arteries in the neck. No appreciable abnormality of venous structures. Limited intracranial: Negative. Visualized orbits: Negative. Mastoids and visualized paranasal sinuses: Left maxillary sinus mucosal thickening and fluid level. Normally aerated mastoid air cells. Skeleton: Moderate cervical spondylosis. Prominent disc osteophyte complexes at the C3-4 and C6-7 moderately narrows the spinal canal. Multilevel uncovertebral and facet hypertrophy with encroachment on neural foramen most pronounced at the C5 through C7 levels. Upper chest: 2 mm nodules in the right upper lobe (Series 2, image 83, 92, 96). Other: None. IMPRESSION: 1. No mass or inflammatory process identified. 2. Mild calcific atherosclerosis of aorta and bilateral carotid bifurcations. 3.  Left maxillary sinus fluid level may represent acute sinusitis in the appropriate clinical setting. 4. Moderate cervical spondylosis greatest at the C3-4 and C6-7 levels with there is moderate bony canal stenosis. 5. 2 mm nodules in the right upper lobe of the lung. No follow-up needed if patient is low-risk (and has no known or suspected primary neoplasm). Non-contrast chest CT can be considered in 12 months if patient is high-risk. This recommendation follows the consensus statement: Guidelines for Management of Incidental Pulmonary Nodules Detected on CT Images: From the Fleischner Society 2017; Radiology 2017; 284:228-243. Electronically Signed   By: Mitzi Hansen M.D.   On: 09/04/2016 15:23    Assessment and Plan:   Pavielle Biggar Berninger is a 76 y.o. y/o female has been referred for dysphagia. Her history is suggestive of a possible peptic stricture from chronic reflux probably exacerbate from a hiatal hernia.   Plan  1. Commence on Zantac 150 mg BID 2. Get ok from Dr Sherie Don if ok to decrease Asprin to 81 mg for 5 days prior to dilation -can continue on asprin during dilation at 81 mg .  3. EGD+/- dilation in 4-6 weeks. Pesently recovering from a URI  Follow up as needd   Dr [name] MD,MRCP(U.K)

## 2016-10-01 ENCOUNTER — Telehealth: Payer: Self-pay | Admitting: *Deleted

## 2016-10-01 ENCOUNTER — Telehealth: Payer: Self-pay

## 2016-10-01 NOTE — Telephone Encounter (Signed)
10/01/16 Faxed Prior Auth form to [facility] for  EGD 57017 / R13.10.

## 2016-10-01 NOTE — Telephone Encounter (Signed)
-----   Message from [place], [place] sent at 09/30/2016  2:06 PM EDT ----- Regarding: Pre-Cert: 5/63 Pt scheduled for EGD with Dr. Tobi Bastos.  Dx: Dysphagia Date: 6/29 Location: [facility]

## 2016-10-01 NOTE — Telephone Encounter (Signed)
Advised patient that blood thinner reduction approval was received from Dr. Sherie Don.   Pt to reduce 325mg  aspirin to 81mg ; 5 days prior and return 1 day after.

## 2016-10-13 ENCOUNTER — Telehealth: Payer: Self-pay | Admitting: Gastroenterology

## 2016-10-13 NOTE — Telephone Encounter (Signed)
Patient left a voice message to cancel her procedure. No reason given. I called [facility] to let them know. °

## 2016-10-14 ENCOUNTER — Other Ambulatory Visit: Payer: Self-pay

## 2016-10-14 ENCOUNTER — Telehealth: Payer: Self-pay | Admitting: Gastroenterology

## 2016-10-14 DIAGNOSIS — R1319 Other dysphagia: Secondary | ICD-10-CM

## 2016-10-14 DIAGNOSIS — R1310 Dysphagia, unspecified: Secondary | ICD-10-CM

## 2016-10-14 NOTE — Telephone Encounter (Signed)
Patient would like to r/s her procedure.

## 2016-10-14 NOTE — Telephone Encounter (Signed)
Rescheduled patient's EGD for 7/3

## 2016-10-15 ENCOUNTER — Telehealth: Payer: Self-pay

## 2016-10-15 ENCOUNTER — Encounter: Payer: Self-pay | Admitting: Family Medicine

## 2016-10-15 ENCOUNTER — Ambulatory Visit (INDEPENDENT_AMBULATORY_CARE_PROVIDER_SITE_OTHER): Payer: Medicare HMO | Admitting: Family Medicine

## 2016-10-15 VITALS — BP 124/72 | HR 75 | Temp 97.4°F | Resp 14 | Wt 134.2 lb

## 2016-10-15 DIAGNOSIS — I6523 Occlusion and stenosis of bilateral carotid arteries: Secondary | ICD-10-CM

## 2016-10-15 DIAGNOSIS — M4802 Spinal stenosis, cervical region: Secondary | ICD-10-CM

## 2016-10-15 DIAGNOSIS — M4722 Other spondylosis with radiculopathy, cervical region: Secondary | ICD-10-CM

## 2016-10-15 DIAGNOSIS — I700 Atherosclerosis of aorta: Secondary | ICD-10-CM

## 2016-10-15 DIAGNOSIS — J0130 Acute sphenoidal sinusitis, unspecified: Secondary | ICD-10-CM

## 2016-10-15 MED ORDER — AMOXICILLIN 875 MG PO TABS: 875.0000 mg | ORAL_TABLET | Freq: Two times a day (BID) | ORAL | 0 refills | Status: DC

## 2016-10-15 NOTE — Telephone Encounter (Signed)
Opened in error

## 2016-10-15 NOTE — Assessment & Plan Note (Signed)
See CT scan May 2018; refer to neurosurgeon

## 2016-10-15 NOTE — Assessment & Plan Note (Signed)
Refer to neurosurgeon 

## 2016-10-15 NOTE — Patient Instructions (Addendum)
Start antibiotics Please do eat yogurt daily or take a probiotic daily for the next month or two We want to replace the healthy germs in the gut If you notice foul, watery diarrhea in the next two months, schedule an appointment RIGHT AWAY Consider red yeast rice and plant-based diet for cholesterol Read about Repatha and let me know if you'd like to see someone about starting that

## 2016-10-15 NOTE — Progress Notes (Signed)
BP 124/72   Pulse 75   Temp 97.4 F (36.3 C) (Oral)   Resp 14   Wt 134 lb 3.2 oz (60.9 kg)   SpO2 95%   BMI 26.21 kg/m    Subjective:    Patient ID: Yvonne Espinoza, female    DOB: 1940-09-03, 76 y.o.   MRN: [id number]  HPI: Yvonne Espinoza is a 76 y.o. female  Chief Complaint  Patient presents with  . Nasal Congestion    with loss of voice  . Neck Pain    left side   HPI Patient has been sick since Thursday almost a month ago; not enough to keep her down; at first, couldn't even breathe well, so much noise and lots of congestion; couldn't get it to come up and couldn't blow it out; she talked to the pharmacist and s/he recommended mucinex Ear has been plugged up; left side; used H2O2 and has a pain that shot down into the neck; full strength, then a few seconds had the pain go down the neck on the left side Breaks out in hot sweat, but it doesn't last; pressure in the left ear Not muich sinus drainage  She has seen Dr. Vicente Males and will put off the EGD; July 3rd  She has hyperlipidemia; she did not tolerate the Zetia; bothered her legs; has not tolerated statin; just occasional fatty meats; discussed injection such as Repatha Lab Results  Component Value Date   CHOL 247 (H) 08/14/2016   HDL 57 08/14/2016   LDLCALC 159 (H) 08/14/2016   TRIG 157 (H) 08/14/2016   CHOLHDL 4.3 08/14/2016    Depression screen [facility] 2/9 10/15/2016 08/21/2016 08/14/2016 02/13/2016 12/31/2015  Decreased Interest 0 0 0 0 0  Down, Depressed, Hopeless 0 0 0 0 0  PHQ - 2 Score 0 0 0 0 0    Relevant past medical, surgical, family and social history reviewed Past Medical History:  Diagnosis Date  . Abscess    colon  . Arthritis    RA  . Collagen vascular disease (HCC)    Rhematoid Arthritis  . Colovesical fistula    2016  . Degenerative cervical spinal stenosis 09/08/2016  . Diverticulitis   . DNAR (do not attempt resuscitation) 08/14/2016   Discussed in person August 14, 2016  . GERD  (gastroesophageal reflux disease)   . Kidney cysts   . Lung nodule < 6cm on CT 09/08/2016  . Reflux esophagitis   . SBO (small bowel obstruction) ([place]) 04/25/2015   partial   . Spinal stenosis   . Stroke [facility])    TIA 2009  . Thoracic aortic atherosclerosis ([place]) 09/08/2016  . TIA (transient ischemic attack)    2209,2010  . 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 Wears glasses    Past Surgical History:  Procedure Laterality Date  . CARDIOVASCULAR STRESS TEST  08/08/11 and 06/07/10  . COLOSTOMY REVERSAL  09/26/2014   dr wyatt  . COLOSTOMY REVISION N/A 06/26/2014   Procedure: SIGMOID COLECTOMY WITH BLADDER REPAIR;  Surgeon: Doreen Salvage, MD;  Location: [place];  Service: General;  Laterality: N/A;  . CYSTOSCOPY W/ URETERAL STENT PLACEMENT Left 06/26/2014   Procedure: BOARI FLAP LEFT URETERAL REIMPLANT;  Surgeon: Jorja Loa, MD;  Location: [place];  Service: Urology;  Laterality: Left;  . DILATION AND CURETTAGE OF UTERUS    . ESOPHAGEAL DILATION  10/10/08  . FLEXIBLE SIGMOIDOSCOPY N/A 09/26/2014   Procedure: RIGID SIGMOIDOSCOPY;  Surgeon: Judeth Horn, MD;  Location:  [place] OR;  Service: General;  Laterality: N/A;  . ILEOSTOMY N/A 06/26/2014   Procedure: LOOP ILEOSTOMY;  Surgeon: Doreen Salvage, MD;  Location: [place];  Service: General;  Laterality: N/A;  . KNEE ARTHROSCOPY Bilateral   . SHOULDER SURGERY     right 2010  . TRANSVERSE LOOP COLOSTOMY N/A 09/26/2014   Procedure: LOOP COLOSTOMY REVERESAL;  Surgeon: Judeth Horn, MD;  Location: [facility] OR;  Service: General;  Laterality: N/A;   Family History  Problem Relation Age of Onset  . Diabetes Mother        pre dm  . Diabetes Father   . Heart attack Father   . Heart disease Father   . Diabetes Sister   . Stroke Sister   . Glaucoma Brother   . Diabetes Brother   . Diabetes Sister   . Hypertension Son   . Hyperlipidemia Son   . Diabetes Son   . Stroke Maternal Aunt   . Diabetes Maternal Grandfather   . Cancer Neg Hx   . COPD Neg Hx   .  Breast cancer Neg Hx    Social History   Social History  . Marital status: Married    Spouse name: N/A  . Number of children: N/A  . Years of education: N/A   Occupational History  . Not on file.   Social History Main Topics  . Smoking status: Never Smoker  . Smokeless tobacco: Never Used  . Alcohol use Yes     Comment: "rarely"  . Drug use: No  . Sexual activity: Not on file   Other Topics Concern  . Not on file   Social History Narrative  . No narrative on file    Interim medical history since last visit reviewed. Allergies and medications reviewed  Review of Systems Per HPI unless specifically indicated above     Objective:    BP 124/72   Pulse 75   Temp 97.4 F (36.3 C) (Oral)   Resp 14   Wt 134 lb 3.2 oz (60.9 kg)   SpO2 95%   BMI 26.21 kg/m   Wt Readings from Last 3 Encounters:  10/15/16 134 lb 3.2 oz (60.9 kg)  09/30/16 133 lb 9.6 oz (60.6 kg)  08/21/16 136 lb 3.2 oz (61.8 kg)    Physical Exam  Constitutional: She appears well-developed and well-nourished.  HENT:  Right Ear: No drainage. Tympanic membrane is not injected, not perforated and not erythematous. No middle ear effusion. No hemotympanum. No decreased hearing is noted.  Left Ear: No drainage. Tympanic membrane is injected. Tympanic membrane is not perforated and not erythematous. A middle ear effusion is present. No hemotympanum. Decreased hearing is noted.  Nose: Mucosal edema and rhinorrhea present.  Mouth/Throat: Mucous membranes are normal. Posterior oropharyngeal erythema (mildly injected) present. No oropharyngeal exudate or posterior oropharyngeal edema.  Right nasal mucosa angrier than left  Eyes: EOM are normal. No scleral icterus.  Cardiovascular: Normal rate and regular rhythm.   Pulmonary/Chest: Effort normal and breath sounds normal.  Lymphadenopathy:    She has cervical adenopathy.       Left cervical: Superficial cervical adenopathy present.  Psychiatric: She has a  normal mood and affect. Her behavior is normal.   Results for orders placed or performed in visit on 08/14/16  CBC with Differential/Platelet  Result Value Ref Range   WBC 7.3 3.8 - 10.8 K/uL   RBC 4.87 3.80 - 5.10 MIL/uL   Hemoglobin 14.6 11.7 - 15.5 g/dL   HCT  44.6 35.0 - 45.0 %   MCV 91.6 80.0 - 100.0 fL   MCH 30.0 27.0 - 33.0 pg   MCHC 32.7 32.0 - 36.0 g/dL   RDW 14.9 11.0 - 15.0 %   Platelets 298 140 - 400 K/uL   MPV 9.6 7.5 - 12.5 fL   Neutro Abs 3,869 1,500 - 7,800 cells/uL   Lymphs Abs 2,263 850 - 3,900 cells/uL   Monocytes Absolute 803 200 - 950 cells/uL   Eosinophils Absolute 365 15 - 500 cells/uL   Basophils Absolute 0 0 - 200 cells/uL   Neutrophils Relative % 53 %   Lymphocytes Relative 31 %   Monocytes Relative 11 %   Eosinophils Relative 5 %   Basophils Relative 0 %   Smear Review Criteria for review not met   COMPLETE METABOLIC PANEL WITH GFR  Result Value Ref Range   Sodium 142 135 - 146 mmol/L   Potassium 4.7 3.5 - 5.3 mmol/L   Chloride 103 98 - 110 mmol/L   CO2 28 20 - 31 mmol/L   Glucose, Bld 89 65 - 99 mg/dL   BUN 18 7 - 25 mg/dL   Creat 0.85 0.60 - 0.93 mg/dL   Total Bilirubin 0.4 0.2 - 1.2 mg/dL   Alkaline Phosphatase 72 33 - 130 U/L   AST 21 10 - 35 U/L   ALT 22 6 - 29 U/L   Total Protein 6.7 6.1 - 8.1 g/dL   Albumin 4.1 3.6 - 5.1 g/dL   Calcium 9.8 8.6 - 10.4 mg/dL   GFR, Est African American 78 >=60 mL/min   GFR, Est Non African American 68 >=60 mL/min  Lipid panel  Result Value Ref Range   Cholesterol 247 (H) <200 mg/dL   Triglycerides 157 (H) <150 mg/dL   HDL 57 >50 mg/dL   Total CHOL/HDL Ratio 4.3 <5.0 Ratio   VLDL 31 (H) <30 mg/dL   LDL Cholesterol 159 (H) <100 mg/dL      Assessment & Plan:   Problem List Items Addressed This Visit      Cardiovascular and Mediastinum   Thoracic aortic atherosclerosis (HCC)    Goal LDL under 70      Carotid atherosclerosis, bilateral    Goal LDL under 70; discussed options; suggested she  consider Repatha or similar medicine; she wishes to try red yeast rice and diet modification        Nervous and Auditory   Cervical spondylosis with radiculopathy    See CT scan May 2018; refer to neurosurgeon      Relevant Orders   Ambulatory referral to Neurosurgery     Other   Degenerative cervical spinal stenosis    Refer to neurosurgeon      Relevant Orders   Ambulatory referral to Neurosurgery       Follow up plan: No Follow-up on file.  An after-visit summary was printed and given to the patient at [place].  Please see the patient instructions which may contain other information and recommendations beyond what is mentioned above in the assessment and plan.  Meds ordered this encounter  Medications  . vitamin B-12 (CYANOCOBALAMIN) 100 MCG tablet    Sig: Take 100 mcg by mouth daily.  Marland Kitchen amoxicillin (AMOXIL) 875 MG tablet    Sig: Take 1 tablet (875 mg total) by mouth 2 (two) times daily.    Dispense:  20 tablet    Refill:  0    Orders Placed This Encounter  Procedures  . Ambulatory  referral to Neurosurgery

## 2016-10-20 NOTE — Assessment & Plan Note (Signed)
Goal LDL under 70 

## 2016-10-20 NOTE — Assessment & Plan Note (Signed)
Goal LDL under 70; discussed options; suggested she consider Repatha or similar medicine; she wishes to try red yeast rice and diet modification

## 2016-10-24 ENCOUNTER — Telehealth: Payer: Self-pay | Admitting: Family Medicine

## 2016-10-24 MED ORDER — DOXYCYCLINE HYCLATE 100 MG PO TABS: 100.0000 mg | ORAL_TABLET | Freq: Two times a day (BID) | ORAL | 0 refills | Status: DC

## 2016-10-24 NOTE — Telephone Encounter (Signed)
Patient called states is still having stopped up ears with swollen glands and waking up with night sweats.  She still has 2 days of antibiotics left please advise?

## 2016-10-24 NOTE — Telephone Encounter (Signed)
Patient nlotified

## 2016-10-24 NOTE — Telephone Encounter (Signed)
Pt would like a call back

## 2016-10-24 NOTE — Telephone Encounter (Signed)
Stop current ABX Start new If not improving next week, appointment

## 2016-10-28 DIAGNOSIS — Z6826 Body mass index (BMI) 26.0-26.9, adult: Secondary | ICD-10-CM | POA: Diagnosis not present

## 2016-10-28 DIAGNOSIS — M542 Cervicalgia: Secondary | ICD-10-CM | POA: Diagnosis not present

## 2016-10-28 DIAGNOSIS — M47812 Spondylosis without myelopathy or radiculopathy, cervical region: Secondary | ICD-10-CM | POA: Diagnosis not present

## 2016-10-28 DIAGNOSIS — R030 Elevated blood-pressure reading, without diagnosis of hypertension: Secondary | ICD-10-CM | POA: Diagnosis not present

## 2016-10-31 ENCOUNTER — Ambulatory Visit: Admit: 2016-10-31 | Payer: Medicare HMO | Admitting: Gastroenterology

## 2016-10-31 SURGERY — ESOPHAGOGASTRODUODENOSCOPY (EGD) WITH PROPOFOL
Anesthesia: General

## 2016-11-03 ENCOUNTER — Encounter: Payer: Self-pay | Admitting: *Deleted

## 2016-11-04 ENCOUNTER — Ambulatory Visit: Payer: Medicare HMO | Admitting: Anesthesiology

## 2016-11-04 ENCOUNTER — Encounter
Admission: RE | Disposition: A | Discharge status: Home / Self Care | Payer: Self-pay | Source: Ambulatory Visit | Attending: Gastroenterology

## 2016-11-04 ENCOUNTER — Ambulatory Visit
Admission: RE | Admit: 2016-11-04 | Discharge: 2016-11-04 | Disposition: A | Discharge status: Home / Self Care | Payer: Medicare HMO | Source: Ambulatory Visit | Attending: Gastroenterology | Admitting: Gastroenterology

## 2016-11-04 ENCOUNTER — Encounter: Payer: Self-pay | Admitting: *Deleted

## 2016-11-04 DIAGNOSIS — Z79899 Other long term (current) drug therapy: Secondary | ICD-10-CM | POA: Insufficient documentation

## 2016-11-04 DIAGNOSIS — Z7982 Long term (current) use of aspirin: Secondary | ICD-10-CM | POA: Diagnosis not present

## 2016-11-04 DIAGNOSIS — Z888 Allergy status to other drugs, medicaments and biological substances status: Secondary | ICD-10-CM | POA: Insufficient documentation

## 2016-11-04 DIAGNOSIS — K222 Esophageal obstruction: Secondary | ICD-10-CM | POA: Diagnosis not present

## 2016-11-04 DIAGNOSIS — I739 Peripheral vascular disease, unspecified: Secondary | ICD-10-CM | POA: Insufficient documentation

## 2016-11-04 DIAGNOSIS — M069 Rheumatoid arthritis, unspecified: Secondary | ICD-10-CM | POA: Diagnosis not present

## 2016-11-04 DIAGNOSIS — R1310 Dysphagia, unspecified: Secondary | ICD-10-CM

## 2016-11-04 DIAGNOSIS — K219 Gastro-esophageal reflux disease without esophagitis: Secondary | ICD-10-CM | POA: Insufficient documentation

## 2016-11-04 DIAGNOSIS — Z8673 Personal history of transient ischemic attack (TIA), and cerebral infarction without residual deficits: Secondary | ICD-10-CM | POA: Diagnosis not present

## 2016-11-04 DIAGNOSIS — I10 Essential (primary) hypertension: Secondary | ICD-10-CM | POA: Diagnosis not present

## 2016-11-04 DIAGNOSIS — R1319 Other dysphagia: Secondary | ICD-10-CM

## 2016-11-04 HISTORY — PX: ESOPHAGOGASTRODUODENOSCOPY (EGD) WITH PROPOFOL: SHX5813

## 2016-11-04 SURGERY — ESOPHAGOGASTRODUODENOSCOPY (EGD) WITH PROPOFOL
Anesthesia: General

## 2016-11-04 MED ORDER — SODIUM CHLORIDE 0.9 % IV SOLN
INTRAVENOUS | Status: DC
  Administered 2016-11-04: 08:00:00 via INTRAVENOUS

## 2016-11-04 MED ORDER — PROPOFOL 500 MG/50ML IV EMUL
INTRAVENOUS | Status: DC | PRN
  Administered 2016-11-04: 125 ug/kg/min via INTRAVENOUS

## 2016-11-04 MED ORDER — LIDOCAINE HCL (PF) 2 % IJ SOLN
INTRAMUSCULAR | Status: AC
  Filled 2016-11-04: qty 2

## 2016-11-04 MED ORDER — LIDOCAINE HCL (CARDIAC) 20 MG/ML IV SOLN
INTRAVENOUS | Status: DC | PRN
  Administered 2016-11-04: 100 mg via INTRAVENOUS

## 2016-11-04 MED ORDER — PROPOFOL 10 MG/ML IV BOLUS
INTRAVENOUS | Status: DC | PRN
  Administered 2016-11-04: 60 mg via INTRAVENOUS
  Administered 2016-11-04: 20 mg via INTRAVENOUS

## 2016-11-04 NOTE — Op Note (Signed)
[facility] Gastroenterology Patient Name: Yvonne Espinoza Procedure Date: 11/04/2016 8:06 AM MRN: [id number] Account #: [id number] Date of Birth: [birth year] Admit Type: Outpatient Age: 76 Room: [facility] ENDO ROOM 1 Gender: Female Note Status: Finalized Procedure:            Upper GI endoscopy Indications:          Dysphagia Providers:            [name] MD, MD Referring MD:         Kerman Passey (Referring MD) Medicines:            Monitored Anesthesia Care Complications:        No immediate complications. Procedure:            Pre-Anesthesia Assessment:                       - Prior to the procedure, a History and Physical was                        performed, and patient medications, allergies and                        sensitivities were reviewed. The patient's tolerance of                        previous anesthesia was reviewed.                       - The risks and benefits of the procedure and the                        sedation options and risks were discussed with the                        patient. All questions were answered and informed                        consent was obtained.                       - ASA Grade Assessment: III - A patient with severe                        systemic disease.                       After obtaining informed consent, the endoscope was                        passed under direct vision. Throughout the procedure,                        the patient's blood pressure, pulse, and oxygen                        saturations were monitored continuously. The Endoscope                        was introduced through the mouth, and advanced to the  third part of duodenum. The upper GI endoscopy was                        accomplished with ease. The patient tolerated the                        procedure well. Findings:      The examined duodenum was normal.      The stomach was normal.      A mild Schatzki ring  (acquired) was found at the gastroesophageal       junction. A TTS dilator was passed through the scope. Dilation with a       12-13.5-15 mm balloon and a 15-16.5-18 mm balloon dilator was performed       to 18 mm. The dilation site was examined following endoscope reinsertion       and showed no change. Biopsies were taken with a cold forceps for       histology. Bxtaken to r/o EOE from the esophagus. Impression:           - Normal examined duodenum.                       - Normal stomach.                       - Mild Schatzki ring. Dilated. Biopsied. Recommendation:       - Discharge patient to home (with escort).                       - Resume previous diet.                       - Continue present medications.                       - Await pathology results.                       - Return to my office in 2 months. Procedure Code(s):    --- Professional ---                       [phone], Esophagogastroduodenoscopy, flexible, transoral;                        with transendoscopic balloon dilation of esophagus                        (less than 30 mm diameter)                       43239, Esophagogastroduodenoscopy, flexible, transoral;                        with biopsy, single or multiple Diagnosis Code(s):    --- Professional ---                       K22.2, Esophageal obstruction                       R13.10, Dysphagia, unspecified CPT copyright 2016 American Medical Association. All rights reserved. The codes documented in this report are preliminary and upon coder review may  be revised to meet current compliance requirements. [name], MD [name] MD, MD 11/04/2016 8:24:38 AM This report has been signed electronically. Number of Addenda: 0 Note Initiated On: 11/04/2016 8:06 AM      [facility]

## 2016-11-04 NOTE — H&P (Signed)
[name] MD [address], [address] [place], [place] [address] Phone: [phone] Fax : [phone]  Primary Care Physician:  Kerman Passey, MD Primary Gastroenterologist:  Dr. [name]   Pre-Procedure History & Physical: HPI:  Yvonne Espinoza is a 76 y.o. female is here for an endoscopy.   Past Medical History:  Diagnosis Date  . Abscess    colon  . Arthritis    RA  . Collagen vascular disease (HCC)    Rhematoid Arthritis  . Colovesical fistula    2016  . Degenerative cervical spinal stenosis 09/08/2016  . Diverticulitis   . DNAR (do not attempt resuscitation) 08/14/2016   Discussed in person August 14, 2016  . GERD (gastroesophageal reflux disease)   . Kidney cysts   . Lung nodule < 6cm on CT 09/08/2016  . Reflux esophagitis   . SBO (small bowel obstruction) (HCC) 04/25/2015   partial   . Spinal stenosis   . Stroke [facility])    TIA 2009  . Thoracic aortic atherosclerosis (HCC) 09/08/2016  . TIA (transient ischemic attack)    2209,2010  . 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 Wears glasses     Past Surgical History:  Procedure Laterality Date  . CARDIOVASCULAR STRESS TEST  08/08/11 and 06/07/10  . COLOSTOMY REVERSAL  09/26/2014   dr wyatt  . COLOSTOMY REVISION N/A 06/26/2014   Procedure: SIGMOID COLECTOMY WITH BLADDER REPAIR;  Surgeon: Frederik Schmidt, MD;  Location: [facility] OR;  Service: General;  Laterality: N/A;  . CYSTOSCOPY W/ URETERAL STENT PLACEMENT Left 06/26/2014   Procedure: BOARI FLAP LEFT URETERAL REIMPLANT;  Surgeon: Chelsea Aus, MD;  Location: [facility] OR;  Service: Urology;  Laterality: Left;  . DILATION AND CURETTAGE OF UTERUS    . ESOPHAGEAL DILATION  10/10/08  . FLEXIBLE SIGMOIDOSCOPY N/A 09/26/2014   Procedure: RIGID SIGMOIDOSCOPY;  Surgeon: Jimmye Norman, MD;  Location: [facility] OR;  Service: General;  Laterality: N/A;  . ILEOSTOMY N/A 06/26/2014   Procedure: LOOP ILEOSTOMY;  Surgeon: Frederik Schmidt, MD;  Location: [facility] OR;  Service: General;  Laterality: N/A;  .  KNEE ARTHROSCOPY Bilateral   . SHOULDER SURGERY     right 2010  . TRANSVERSE LOOP COLOSTOMY N/A 09/26/2014   Procedure: LOOP COLOSTOMY REVERESAL;  Surgeon: Jimmye Norman, MD;  Location: [facility] OR;  Service: General;  Laterality: N/A;    Prior to Admission medications   Medication Sig Start Date End Date Taking? Authorizing Provider  aspirin EC 325 MG tablet Take 325 mg by mouth daily.   Yes [provider]  Coenzyme Q10 (CO Q-10) 100 MG CAPS Take 100 mg by mouth daily.   Yes [provider]  folic acid (FOLVITE) 1 MG tablet Take 1 mg by mouth daily.    Yes [provider]  Lutein 40 MG CAPS Take 40 mg by mouth daily.   Yes [provider]  Multiple Vitamin (MULTIVITAMIN WITH MINERALS) TABS tablet Take 1 tablet by mouth daily.   Yes [provider]  ranitidine (ZANTAC) 150 MG tablet Take 1 tablet (150 mg total) by mouth 2 (two) times daily. 09/30/16 12/31/16 Yes [name], MD  vitamin B-12 (CYANOCOBALAMIN) 100 MCG tablet Take 100 mcg by mouth daily.   Yes [provider]  doxycycline (VIBRA-TABS) 100 MG tablet Take 1 tablet (100 mg total) by mouth 2 (two) times daily. Patient not taking: Reported on 11/04/2016 10/24/16   Kerman Passey, MD  triamcinolone cream (KENALOG) 0.1 % Apply 1 application topically  2 (two) times daily. Mix one to one with gentle moisturizing lotion; as needed 08/14/16   Kerman Passey, MD    Allergies as of 10/14/2016 - Review Complete 09/30/2016  Allergen Reaction Noted  . Gabapentin Other (See Comments) 05/01/2014  . Neosporin [neomycin-bacitracin zn-polymyx]  05/01/2014  . Prednisone Other (See Comments) 05/01/2014  . Statins Other (See Comments) 04/20/2015  . Methotrexate derivatives  09/30/2016    Family History  Problem Relation Age of Onset  . Diabetes Mother        pre dm  . Diabetes Father   . Heart attack Father   . Heart disease Father   . Diabetes Sister   . Stroke Sister   . Glaucoma Brother   .  Diabetes Brother   . Diabetes Sister   . Hypertension Son   . Hyperlipidemia Son   . Diabetes Son   . Stroke Maternal Aunt   . Diabetes Maternal Grandfather   . Cancer Neg Hx   . COPD Neg Hx   . Breast cancer Neg Hx     Social History   Social History  . Marital status: Married    Spouse name: N/A  . Number of children: N/A  . Years of education: N/A   Occupational History  . Not on file.   Social History Main Topics  . Smoking status: Never Smoker  . Smokeless tobacco: Never Used  . Alcohol use Yes     Comment: "rarely"  . Drug use: No  . Sexual activity: Not on file   Other Topics Concern  . Not on file   Social History Narrative  . No narrative on file    Review of Systems: See HPI, otherwise negative ROS  Physical Exam: BP (!) 141/62   Pulse 75   Temp 97 F (36.1 C) (Tympanic)   Resp 18   Ht 5' (1.524 m)   Wt 130 lb (59 kg)   SpO2 99%   BMI 25.39 kg/m  General:   Alert,  pleasant and cooperative in NAD Head:  Normocephalic and atraumatic. Neck:  Supple; no masses or thyromegaly. Lungs:  Clear throughout to auscultation.    Heart:  Regular rate and rhythm. Abdomen:  Soft, nontender and nondistended. Normal bowel sounds, without guarding, and without rebound.   Neurologic:  Alert and  oriented x4;  grossly normal neurologically.  Impression/Plan: Yvonne Espinoza is here for an endoscopy to be performed for dysphagia   Risks, benefits, limitations, and alternatives regarding  endoscopy and dilation  have been reviewed with the patient.  Questions have been answered.  All parties agreeable.   [name], MD  11/04/2016, 8:02 AM

## 2016-11-04 NOTE — Transfer of Care (Signed)
Immediate Anesthesia Transfer of Care Note  Patient: Yvonne Espinoza  Procedure(s) Performed: Procedure(s): ESOPHAGOGASTRODUODENOSCOPY (EGD) WITH PROPOFOL WITH DILATION (N/A)  Patient Location: PACU  Anesthesia Type:General  Level of Consciousness: sedated  Airway & Oxygen Therapy: Patient Spontanous Breathing and Patient connected to nasal cannula oxygen  Post-op Assessment: Report given to RN and Post -op Vital signs reviewed and stable  Post vital signs: Reviewed and stable  Last Vitals:  Vitals:   11/04/16 0729  BP: (!) 141/62  Pulse: 75  Resp: 18  Temp: 36.1 C    Last Pain:  Vitals:   11/04/16 0729  TempSrc: Tympanic         Complications: No apparent anesthesia complications

## 2016-11-04 NOTE — Anesthesia Preprocedure Evaluation (Signed)
Anesthesia Evaluation  Patient identified by MRN, date of birth, ID band Patient awake    Reviewed: Allergy & Precautions, NPO status , Patient's Chart, lab work & pertinent test results, Unable to perform ROS - Chart review only  Airway Mallampati: II  TM Distance: >3 FB Neck ROM: Full    Dental  (+) Dental Advisory Given, Upper Dentures   Pulmonary    breath sounds clear to auscultation       Cardiovascular hypertension, Pt. on medications + Peripheral Vascular Disease   Rhythm:Regular Rate:Normal     Neuro/Psych TIA Neuromuscular disease CVA negative psych ROS   GI/Hepatic Neg liver ROS, GERD  ,  Endo/Other  negative endocrine ROS  Renal/GU   negative genitourinary   Musculoskeletal  (+) Arthritis ,   Abdominal   Peds negative pediatric ROS (+)  Hematology negative hematology ROS (+)   Anesthesia Other Findings Past Medical History: No date: Abscess     Comment: colon No date: Arthritis     Comment: RA No date: Collagen vascular disease (HCC)     Comment: Rhematoid Arthritis No date: Colovesical fistula     Comment: 2016 09/08/2016: Degenerative cervical spinal stenosis No date: Diverticulitis 08/14/2016: DNAR (do not attempt resuscitation)     Comment: Discussed in person August 14, 2016 No date: GERD (gastroesophageal reflux disease) No date: Kidney cysts 09/08/2016: Lung nodule < 6cm on CT No date: Reflux esophagitis 04/25/2015: SBO (small bowel obstruction) (HCC)     Comment: partial  No date: Spinal stenosis No date: Stroke [facility])     Comment: TIA 2009 09/08/2016: Thoracic aortic atherosclerosis (HCC) No date: TIA (transient ischemic attack)     Comment: 2209,2010 5/14: TMJ (temporomandibular joint disorder) No date: Wears glasses  Reproductive/Obstetrics                             Anesthesia Physical  Anesthesia Plan  ASA: III  Anesthesia Plan: General   Post-op  Pain Management:    Induction: Intravenous  PONV Risk Score and Plan:   Airway Management Planned: Nasal Cannula  Additional Equipment:   Intra-op Plan:   Post-operative Plan:   Informed Consent: I have reviewed the patients History and Physical, chart, labs and discussed the procedure including the risks, benefits and alternatives for the proposed anesthesia with the patient or authorized representative who has indicated his/her understanding and acceptance.   Dental advisory given  Plan Discussed with: CRNA and Anesthesiologist  Anesthesia Plan Comments: (H/O diverticulitis with abscess S/P colectomy with colostomy H/O TIA  Kipp Brood)        Anesthesia Quick Evaluation

## 2016-11-04 NOTE — Anesthesia Post-op Follow-up Note (Cosign Needed)
Anesthesia QCDR form completed.        

## 2016-11-04 NOTE — Anesthesia Postprocedure Evaluation (Signed)
Anesthesia Post Note  Patient: Yvonne Espinoza  Procedure(s) Performed: Procedure(s) (LRB): ESOPHAGOGASTRODUODENOSCOPY (EGD) WITH PROPOFOL WITH DILATION (N/A)  Patient location during evaluation: PACU Anesthesia Type: General Level of consciousness: awake and alert and oriented Pain management: pain level controlled Vital Signs Assessment: post-procedure vital signs reviewed and stable Respiratory status: spontaneous breathing Cardiovascular status: blood pressure returned to baseline Anesthetic complications: no     Last Vitals:  Vitals:   11/04/16 0848 11/04/16 0858  BP: 121/64 108/68  Pulse: 66 62  Resp: 14 16  Temp:      Last Pain:  Vitals:   11/04/16 0828  TempSrc: Tympanic                 Brentton Wardlow

## 2016-11-04 NOTE — Anesthesia Procedure Notes (Signed)
Performed by: Breawna Montenegro Pre-anesthesia Checklist: Patient identified, Emergency Drugs available, Suction available, Patient being monitored and Timeout performed Oxygen Delivery Method: Nasal cannula       

## 2016-11-06 ENCOUNTER — Encounter: Payer: Self-pay | Admitting: Gastroenterology

## 2016-11-06 LAB — SURGICAL PATHOLOGY

## 2016-11-10 ENCOUNTER — Encounter: Payer: Self-pay | Admitting: Gastroenterology

## 2016-11-13 ENCOUNTER — Other Ambulatory Visit: Payer: Self-pay | Admitting: Neurosurgery

## 2016-11-13 DIAGNOSIS — M47812 Spondylosis without myelopathy or radiculopathy, cervical region: Secondary | ICD-10-CM

## 2016-11-19 ENCOUNTER — Ambulatory Visit
Admission: RE | Admit: 2016-11-19 | Discharge: 2016-11-19 | Disposition: A | Discharge status: Home / Self Care | Payer: Medicare HMO | Source: Ambulatory Visit | Attending: Neurosurgery | Admitting: Neurosurgery

## 2016-11-19 DIAGNOSIS — M50222 Other cervical disc displacement at C5-C6 level: Secondary | ICD-10-CM | POA: Diagnosis not present

## 2016-11-19 DIAGNOSIS — M50223 Other cervical disc displacement at C6-C7 level: Secondary | ICD-10-CM | POA: Insufficient documentation

## 2016-11-19 DIAGNOSIS — M47812 Spondylosis without myelopathy or radiculopathy, cervical region: Secondary | ICD-10-CM | POA: Diagnosis not present

## 2016-11-19 DIAGNOSIS — M50221 Other cervical disc displacement at C4-C5 level: Secondary | ICD-10-CM | POA: Diagnosis not present

## 2016-11-20 ENCOUNTER — Encounter: Payer: Self-pay | Admitting: Family Medicine

## 2016-11-20 ENCOUNTER — Ambulatory Visit (INDEPENDENT_AMBULATORY_CARE_PROVIDER_SITE_OTHER): Payer: Medicare HMO | Admitting: Family Medicine

## 2016-11-20 DIAGNOSIS — IMO0001 Reserved for inherently not codable concepts without codable children: Secondary | ICD-10-CM

## 2016-11-20 DIAGNOSIS — Z8673 Personal history of transient ischemic attack (TIA), and cerebral infarction without residual deficits: Secondary | ICD-10-CM | POA: Diagnosis not present

## 2016-11-20 DIAGNOSIS — E782 Mixed hyperlipidemia: Secondary | ICD-10-CM | POA: Diagnosis not present

## 2016-11-20 DIAGNOSIS — I700 Atherosclerosis of aorta: Secondary | ICD-10-CM | POA: Diagnosis not present

## 2016-11-20 DIAGNOSIS — K219 Gastro-esophageal reflux disease without esophagitis: Secondary | ICD-10-CM | POA: Diagnosis not present

## 2016-11-20 DIAGNOSIS — R911 Solitary pulmonary nodule: Secondary | ICD-10-CM

## 2016-11-20 DIAGNOSIS — I6523 Occlusion and stenosis of bilateral carotid arteries: Secondary | ICD-10-CM

## 2016-11-20 NOTE — Assessment & Plan Note (Signed)
Due for repeat scan in May 2019

## 2016-11-20 NOTE — Progress Notes (Signed)
BP 122/76   Pulse 75   Temp 97.7 F (36.5 C) (Oral)   Resp 14   Wt 136 lb 1.6 oz (61.7 kg)   SpO2 96%   BMI 26.58 kg/m    Subjective:    Patient ID: Yvonne Espinoza, female    DOB: 07/28/1940, 76 y.o.   MRN: [id number]  HPI: Yvonne Espinoza is a 76 y.o. female  Chief Complaint  Patient presents with  . Follow-up    HPI Patient is here for 3 month f/u She has heartburn, on zantac for symptoms; twice a day; Dr. Tobi Bastos put her on that; they did the scope and stretched her esophagus, she had a Schatzki's ring; no blood in the stool; no abd pain  She has rheumatoid arthritis; not bothersome to her, not seeing anybody for it; she saw rheumatologist; she ran tests and said have RA, but after all the surgery, all the pain and discomfort went away; no major swelling, just a little in the right thumb  She has hx of diverticulitis, as well as colon abscess; she gets occasional pains on the right side from where they did the reconnect, had a problem and had to go to the hospital, blockage that re-opened on its own  Hx of TIA; on 325 mg aspirin; saw neurologist years ago She has not tolerated statins; patient has been trying to use red yeast rice; tries to avoid saturated fats, no bacon or sausage; occasional eggs, once a week or twice  Carotid atherosclerosis; bilateral carotid bifurcations blockages  She saw Dr. Lovell Sheehan and had MRI yesterday because of pains in the neck and up the head Still having issue with the left ear; not as bad as it was  Depression screen [facility] 2/9 11/20/2016 10/15/2016 08/21/2016 08/14/2016 02/13/2016  Decreased Interest 0 0 0 0 0  Down, Depressed, Hopeless 0 0 0 0 0  PHQ - 2 Score 0 0 0 0 0    Relevant past medical, surgical, family and social history reviewed Past Medical History:  Diagnosis Date  . Abscess    colon  . Arthritis    RA  . Collagen vascular disease (HCC)    Rhematoid Arthritis  . Colovesical fistula    2016  . Degenerative cervical  spinal stenosis 09/08/2016  . Diverticulitis   . DNAR (do not attempt resuscitation) 08/14/2016   Discussed in person August 14, 2016  . GERD (gastroesophageal reflux disease)   . Kidney cysts   . Lung nodule < 6cm on CT 09/08/2016  . Reflux esophagitis   . SBO (small bowel obstruction) (HCC) 04/25/2015   partial   . Spinal stenosis   . Stroke [facility])    TIA 2009  . Thoracic aortic atherosclerosis (HCC) 09/08/2016  . TIA (transient ischemic attack)    2209,2010  . 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 Wears glasses    Past Surgical History:  Procedure Laterality Date  . CARDIOVASCULAR STRESS TEST  08/08/11 and 06/07/10  . COLOSTOMY REVERSAL  09/26/2014   dr wyatt  . COLOSTOMY REVISION N/A 06/26/2014   Procedure: SIGMOID COLECTOMY WITH BLADDER REPAIR;  Surgeon: Frederik Schmidt, MD;  Location: [facility] OR;  Service: General;  Laterality: N/A;  . CYSTOSCOPY W/ URETERAL STENT PLACEMENT Left 06/26/2014   Procedure: BOARI FLAP LEFT URETERAL REIMPLANT;  Surgeon: Chelsea Aus, MD;  Location: [facility] OR;  Service: Urology;  Laterality: Left;  . DILATION AND CURETTAGE OF UTERUS    . ESOPHAGEAL DILATION  10/10/08  .  ESOPHAGOGASTRODUODENOSCOPY (EGD) WITH PROPOFOL N/A 11/04/2016   Procedure: ESOPHAGOGASTRODUODENOSCOPY (EGD) WITH PROPOFOL WITH DILATION;  Surgeon: [name], MD;  Location: [facility] ENDOSCOPY;  Service: Endoscopy;  Laterality: N/A;  . FLEXIBLE SIGMOIDOSCOPY N/A 09/26/2014   Procedure: RIGID SIGMOIDOSCOPY;  Surgeon: Jimmye Norman, MD;  Location: [facility] OR;  Service: General;  Laterality: N/A;  . ILEOSTOMY N/A 06/26/2014   Procedure: LOOP ILEOSTOMY;  Surgeon: Frederik Schmidt, MD;  Location: [facility] OR;  Service: General;  Laterality: N/A;  . KNEE ARTHROSCOPY Bilateral   . SHOULDER SURGERY     right 2010  . TRANSVERSE LOOP COLOSTOMY N/A 09/26/2014   Procedure: LOOP COLOSTOMY REVERESAL;  Surgeon: Jimmye Norman, MD;  Location: [facility] OR;  Service: General;  Laterality: N/A;   Family History  Problem Relation Age of Onset  .  Diabetes Mother        pre dm  . Diabetes Father   . Heart attack Father   . Heart disease Father   . Diabetes Sister   . Stroke Sister   . Glaucoma Brother   . Diabetes Brother   . Diabetes Sister   . Hypertension Son   . Hyperlipidemia Son   . Diabetes Son   . Stroke Maternal Aunt   . Diabetes Maternal Grandfather   . Cancer Neg Hx   . COPD Neg Hx   . Breast cancer Neg Hx    Social History   Social History  . Marital status: Married    Spouse name: N/A  . Number of children: N/A  . Years of education: N/A   Occupational History  . Not on file.   Social History Main Topics  . Smoking status: Never Smoker  . Smokeless tobacco: Never Used  . Alcohol use Yes     Comment: "rarely"  . Drug use: No  . Sexual activity: Yes   Other Topics Concern  . Not on file   Social History Narrative  . No narrative on file    Interim medical history since last visit reviewed. Allergies and medications reviewed  Review of Systems Per HPI unless specifically indicated above     Objective:    BP 122/76   Pulse 75   Temp 97.7 F (36.5 C) (Oral)   Resp 14   Wt 136 lb 1.6 oz (61.7 kg)   SpO2 96%   BMI 26.58 kg/m   Wt Readings from Last 3 Encounters:  11/20/16 136 lb 1.6 oz (61.7 kg)  11/04/16 130 lb (59 kg)  10/15/16 134 lb 3.2 oz (60.9 kg)    Physical Exam  Constitutional: She appears well-developed and well-nourished. No distress.  Eyes: EOM are normal. No scleral icterus.  Neck: No thyromegaly present.  Cardiovascular: Normal rate, regular rhythm and normal heart sounds.   No murmur heard. Pulmonary/Chest: Effort normal and breath sounds normal. No respiratory distress. She has no wheezes.  Abdominal: Soft. She exhibits no distension.  Musculoskeletal: She exhibits no edema.       Right hand: She exhibits tenderness (mild tenderness base of right thumb). She exhibits normal range of motion.  Neurological: She is alert. She exhibits normal muscle tone.  Skin:  Skin is warm and dry. No pallor.  Psychiatric: She has a normal mood and affect. Her behavior is normal. Judgment and thought content normal.   Results for orders placed or performed during the hospital encounter of 11/04/16  Surgical pathology  Result Value Ref Range   SURGICAL PATHOLOGY      Surgical Pathology CASE: [id number]  PATIENT: Arther Dames Surgical Pathology Report     SPECIMEN SUBMITTED: A. Esophagus, random, R/O EOE; bx  CLINICAL HISTORY: None provided  PRE-OPERATIVE DIAGNOSIS: Dysphagia  POST-OPERATIVE DIAGNOSIS: Schatzki ring; mild stricture     DIAGNOSIS: A.  RANDOM ESOPHAGUS; BIOPSY: - SQUAMOUS MUCOSA WITH 3 INTRAEPITHELIAL EOSINOPHILS IN ONE HIGH-POWER FIELD. - NEGATIVE FOR DYSPLASIA AND MALIGNANCY.  Note: The changes are suggestive of reflux in the appropriate clinical setting.   GROSS DESCRIPTION:  A. Labeled: random esophagus biopsy rule out EOE  Tissue fragment(s): multiple  Size: aggregate, 1.1 x 0.3 x 0.1 cm  Description: pink-tan fragments  Entirely submitted in one cassette(s).    Final Diagnosis performed by Glenice Bow, MD.  Electronically signed 11/06/2016 11:29:20AM    The electronic signature indicates that the named Attending Pathologist has evaluated the specimen  Technical component  performed at [facility], [address], [place], [place] [address] Lab: [phone] Dir: Titus Dubin. Cato Mulligan, MD  Professional component performed at [facility], [address] [place], [place], [place] [address] Lab: [phone] Dir: Georgiann Cocker. Rubinas, MD        Assessment & Plan:   Problem List Items Addressed This Visit      Cardiovascular and Mediastinum   Thoracic aortic atherosclerosis (HCC)    Goal LDL is less than 70; patient is not interested in shot first, going to see today how the RYR did; would consider shot (Repatha, e.g.)      Relevant Orders   Lipid panel   Carotid atherosclerosis,  bilateral    Continue 325 mg aspirin; goal LDL under 70, but cannot tolerate statins; willing to try Repatha if needed; refer to vascular surgeon      Relevant Orders   Ambulatory referral to Vascular Surgery   Lipid panel     Digestive   Esophageal reflux    Treated by H2 blocker; avoid triggers      Relevant Medications   Probiotic Product (PROBIOTIC-10 PO)     Other   Lung nodule < 6cm on CT    Due for repeat scan in May 2019      Hyperlipidemia (Chronic)    Check lipids today; limit saturated fats; she has not tolerated statins      Hx-TIA (transient ischemic attack) (Chronic)    Refer to vascular; continue 325 mg aspirin; goal LDL less than 70; offered referral back to neurologist      Relevant Orders   Ambulatory referral to Vascular Surgery   Ambulatory referral to Neurology       Follow up plan: No Follow-up on file.  An after-visit summary was printed and given to the patient at check-out.  Please see the patient instructions which may contain other information and recommendations beyond what is mentioned above in the assessment and plan.  Meds ordered this encounter  Medications  . Probiotic Product (PROBIOTIC-10 PO)    Sig: Take by mouth.  . Red Yeast Rice Extract (RED YEAST RICE PO)    Sig: Take by mouth.    Orders Placed This Encounter  Procedures  . Lipid panel  . Ambulatory referral to Vascular Surgery  . Ambulatory referral to Neurology

## 2016-11-20 NOTE — Assessment & Plan Note (Signed)
Continue 325 mg aspirin; goal LDL under 70, but cannot tolerate statins; willing to try Repatha if needed; refer to vascular surgeon

## 2016-11-20 NOTE — Assessment & Plan Note (Signed)
Refer to vascular; continue 325 mg aspirin; goal LDL less than 70; offered referral back to neurologist

## 2016-11-20 NOTE — Assessment & Plan Note (Signed)
Goal LDL is less than 70; patient is not interested in shot first, going to see today how the RYR did; would consider shot (Repatha, e.g.)

## 2016-11-20 NOTE — Assessment & Plan Note (Signed)
Treated by H2 blocker; avoid triggers

## 2016-11-20 NOTE — Assessment & Plan Note (Signed)
Check lipids today; limit saturated fats; she has not tolerated statins

## 2016-11-20 NOTE — Patient Instructions (Signed)
Try to limit saturated fats in your diet (bologna, hot dogs, barbeque, cheeseburgers, hamburgers, steak, bacon, sausage, cheese, etc.) and get more fresh fruits, vegetables, and whole grains We'll have you see the vascular doctor about the blockages in your neck Continue aspirin We'll get labs today and contact you with those results

## 2016-11-21 ENCOUNTER — Other Ambulatory Visit: Payer: Self-pay | Admitting: Family Medicine

## 2016-11-21 DIAGNOSIS — R928 Other abnormal and inconclusive findings on diagnostic imaging of breast: Secondary | ICD-10-CM

## 2016-11-21 LAB — LIPID PANEL
Cholesterol: 258 mg/dL — ABNORMAL HIGH (ref <200)
HDL: 59 mg/dL (ref >50)
LDL CALC: 178 mg/dL — AB (ref <100)
TRIGLYCERIDES: 104 mg/dL (ref <150)
Total CHOL/HDL Ratio: 4.4 Ratio (ref <5.0)
VLDL: 21 mg/dL (ref <30)

## 2016-11-21 MED ORDER — EVOLOCUMAB 140 MG/ML ~~LOC~~ SOSY: 1.0000 | PREFILLED_SYRINGE | SUBCUTANEOUS | 5 refills | Status: DC

## 2016-11-21 NOTE — Progress Notes (Signed)
Sending repatha Rx

## 2016-11-21 NOTE — Progress Notes (Signed)
Buntin, Camila Li, MD  Cc: Hyman Hopes, NT; Eula Listen, [place] D        Please enter a Diagnostic bilateral tomo mammogram order for this patient. Imaging code is [id number]. She is following up on left breast calcs and its time for her yearly exam. Thank you     I had ordered breast imaging earlier this year, see previous orders from April Entering new orders as requested above

## 2016-11-24 ENCOUNTER — Telehealth: Payer: Self-pay

## 2016-11-24 NOTE — Telephone Encounter (Signed)
Patient called back about chol med will hold off until her next office visit to discuss with you.

## 2016-12-04 ENCOUNTER — Ambulatory Visit: Admission: RE | Admit: 2016-12-04 | Payer: Medicare HMO | Source: Ambulatory Visit

## 2016-12-04 ENCOUNTER — Ambulatory Visit
Admission: RE | Admit: 2016-12-04 | Discharge: 2016-12-04 | Disposition: A | Discharge status: Home / Self Care | Payer: Medicare HMO | Source: Ambulatory Visit | Attending: Family Medicine | Admitting: Family Medicine

## 2016-12-04 DIAGNOSIS — R928 Other abnormal and inconclusive findings on diagnostic imaging of breast: Secondary | ICD-10-CM | POA: Diagnosis not present

## 2016-12-04 DIAGNOSIS — R921 Mammographic calcification found on diagnostic imaging of breast: Secondary | ICD-10-CM | POA: Insufficient documentation

## 2016-12-30 ENCOUNTER — Ambulatory Visit (INDEPENDENT_AMBULATORY_CARE_PROVIDER_SITE_OTHER): Payer: Medicare HMO | Admitting: Vascular Surgery

## 2016-12-30 ENCOUNTER — Encounter (INDEPENDENT_AMBULATORY_CARE_PROVIDER_SITE_OTHER): Payer: Self-pay | Admitting: Vascular Surgery

## 2016-12-30 VITALS — BP 158/71 | HR 64 | Resp 17 | Ht 60.0 in | Wt 134.0 lb

## 2016-12-30 DIAGNOSIS — I6523 Occlusion and stenosis of bilateral carotid arteries: Secondary | ICD-10-CM | POA: Diagnosis not present

## 2016-12-30 DIAGNOSIS — E782 Mixed hyperlipidemia: Secondary | ICD-10-CM | POA: Diagnosis not present

## 2016-12-30 DIAGNOSIS — Z8673 Personal history of transient ischemic attack (TIA), and cerebral infarction without residual deficits: Secondary | ICD-10-CM

## 2016-12-30 DIAGNOSIS — I10 Essential (primary) hypertension: Secondary | ICD-10-CM

## 2016-12-30 NOTE — Assessment & Plan Note (Signed)
blood pressure control important in reducing the progression of atherosclerotic disease. On appropriate oral medications.  

## 2016-12-30 NOTE — Patient Instructions (Signed)
Carotid Artery Disease The carotid arteries are arteries on both sides of the neck. They carry blood to the brain. Carotid artery disease is when the arteries get smaller (narrow) or get blocked. If these arteries get smaller or get blocked, you are more likely to have a stroke or warning stroke (transient ischemic attack). Follow these instructions at home:  Take medicines as told by your doctor. Make sure you understand all your medicine instructions. Do not stop your medicines without talking to your doctor first.  Follow your doctor's diet instructions. It is important to eat a healthy diet that includes plenty of: ? Fresh fruits. ? Vegetables. ? Lean meats.  Avoid: ? High-fat foods. ? High-sodium foods. ? Foods that are fried, overly processed, or have poor nutritional value.  Stay a healthy weight.  Stay active. Get at least 30 minutes of activity every day.  Do not smoke.  Limit alcohol use to: ? No more than 2 drinks a day for men. ? No more than 1 drink a day for women who are not pregnant.  Do not use illegal drugs.  Keep all doctor visits as told. Get help right away if:  You have sudden weakness or loss of feeling (numbness) on one side of the body, such as the face, arm, or leg.  You have sudden confusion.  You have trouble speaking (aphasia) or understanding.  You have sudden trouble seeing out of one or both eyes.  You have sudden trouble walking.  You have dizziness or feel like you might pass out (faint).  You have a loss of balance or your movements are not steady (uncoordinated).  You have a sudden, severe headache with no known cause.  You have trouble swallowing (dysphagia). Call your local emergency services (911 in [place] Do notdrive yourself to the clinic or hospital. This information is not intended to replace advice given to you by your health care provider. Make sure you discuss any questions you have with your health care  provider. Document Released: 04/07/2012 Document Revised: 09/27/2015 Document Reviewed: 10/20/2012 Elsevier Interactive Patient Education  2018 [employer] Inc[employer]  

## 2016-12-30 NOTE — Assessment & Plan Note (Signed)
With very mild carotid disease, this is less likely to be the source of her TIA.

## 2016-12-30 NOTE — Assessment & Plan Note (Signed)
She had a CT scan of the neck about 3-4 months ago which I have independently reviewed. This CT scan demonstrates mild carotid atherosclerosis bilaterally with less than 50% stenosis present on each side. There is significant tortuosity present worse on the left than the right.  At this point, I would recommend she continue her aspirin therapy. I would recommend a heart healthy diet and increasing her activity level. Would recommend continuing to monitor her carotids and since it has been through 4 months since his CT scan, a carotid duplex in about 6 months would be reasonable. She will contact our office with problems in the interim. This degree of stenosis would not warrant intervention at current.

## 2016-12-30 NOTE — Progress Notes (Signed)
Patient ID: Yvonne Espinoza, female   DOB: April 19, 1941, 76 y.o.   MRN: [id number]  Chief Complaint  Patient presents with  . New Patient (Initial Visit)    Carotid Stenosis    HPI Yvonne Espinoza is a 76 y.o. female.  I am asked to see the patient by Dr. Sherie Don for evaluation of carotid disease and previous TIAs.  The patient reports one TIA several months ago with confusion and dizziness.  No arm or leg weakness or numbness.  No speech or swallowing difficulties.  No amaurosis.  She denies fever or chills. She does complain of significant left neck and shoulder pain for which she has seen a neurosurgeon but no surgical intervention is planned. She has a significant number of medical issues as listed below. She had a CT scan of the neck about 3-4 months ago which I have independently reviewed. This CT scan demonstrates mild carotid atherosclerosis bilaterally with less than 50% stenosis present on each side. There is significant tortuosity present worse on the left than the right.    Past Medical History:  Diagnosis Date  . Abscess    colon  . Arthritis    RA  . Collagen vascular disease (HCC)    Rhematoid Arthritis  . Colovesical fistula    2016  . Degenerative cervical spinal stenosis 09/08/2016  . Diverticulitis   . DNAR (do not attempt resuscitation) 08/14/2016   Discussed in person August 14, 2016  . GERD (gastroesophageal reflux disease)   . Kidney cysts   . Lung nodule < 6cm on CT 09/08/2016  . Reflux esophagitis   . SBO (small bowel obstruction) (HCC) 04/25/2015   partial   . Spinal stenosis   . Stroke [facility])    TIA 2009  . Thoracic aortic atherosclerosis (HCC) 09/08/2016  . TIA (transient ischemic attack)    2209,2010  . 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 Wears glasses     Past Surgical History:  Procedure Laterality Date  . CARDIOVASCULAR STRESS TEST  08/08/11 and 06/07/10  . COLOSTOMY REVERSAL  09/26/2014   dr wyatt  . COLOSTOMY REVISION N/A 06/26/2014   Procedure: SIGMOID COLECTOMY WITH BLADDER REPAIR;  Surgeon: Frederik Schmidt, MD;  Location: [facility] OR;  Service: General;  Laterality: N/A;  . CYSTOSCOPY W/ URETERAL STENT PLACEMENT Left 06/26/2014   Procedure: BOARI FLAP LEFT URETERAL REIMPLANT;  Surgeon: Chelsea Aus, MD;  Location: [facility] OR;  Service: Urology;  Laterality: Left;  . DILATION AND CURETTAGE OF UTERUS    . ESOPHAGEAL DILATION  10/10/08  . ESOPHAGOGASTRODUODENOSCOPY (EGD) WITH PROPOFOL N/A 11/04/2016   Procedure: ESOPHAGOGASTRODUODENOSCOPY (EGD) WITH PROPOFOL WITH DILATION;  Surgeon: [name], MD;  Location: [facility] ENDOSCOPY;  Service: Endoscopy;  Laterality: N/A;  . FLEXIBLE SIGMOIDOSCOPY N/A 09/26/2014   Procedure: RIGID SIGMOIDOSCOPY;  Surgeon: Jimmye Norman, MD;  Location: [facility] OR;  Service: General;  Laterality: N/A;  . ILEOSTOMY N/A 06/26/2014   Procedure: LOOP ILEOSTOMY;  Surgeon: Frederik Schmidt, MD;  Location: [facility] OR;  Service: General;  Laterality: N/A;  . KNEE ARTHROSCOPY Bilateral   . SHOULDER SURGERY     right 2010  . TRANSVERSE LOOP COLOSTOMY N/A 09/26/2014   Procedure: LOOP COLOSTOMY REVERESAL;  Surgeon: Jimmye Norman, MD;  Location: [facility] OR;  Service: General;  Laterality: N/A;    Family History  Problem Relation Age of Onset  . Diabetes Mother        pre dm  . Diabetes Father   . Heart attack Father   .  Heart disease Father   . Diabetes Sister   . Stroke Sister   . Glaucoma Brother   . Diabetes Brother   . Diabetes Sister   . Hypertension Son   . Hyperlipidemia Son   . Diabetes Son   . Stroke Maternal Aunt   . Diabetes Maternal Grandfather   . Cancer Neg Hx   . COPD Neg Hx   . Breast cancer Neg Hx     Social History Social History  Substance Use Topics  . Smoking status: Never Smoker  . Smokeless tobacco: Never Used  . Alcohol use Yes     Comment: "rarely"  No IVDU  Allergies  Allergen Reactions  . Gabapentin Other (See Comments)    "every side effect listed on pamphlet"  . Neosporin [Neomycin-Bacitracin  Zn-Polymyx]   . Prednisone Other (See Comments)    Tachycardia/mood swings  . Statins Other (See Comments)    Other reaction(s): Other (See Comments) Swelling  Swelling   . Methotrexate Derivatives     Current Outpatient Prescriptions  Medication Sig Dispense Refill  . aspirin EC 325 MG tablet Take 325 mg by mouth daily.    . Coenzyme Q10 (CO Q 10) 100 MG CAPS Take by mouth daily.    . cyanocobalamin 1000 MCG tablet Take 500 mcg by mouth 2 (two) times daily.    . folic acid (FOLVITE) 1 MG tablet Take 1 mg by mouth daily.     . hydrochlorothiazide (HYDRODIURIL) 12.5 MG tablet Take 12.5 mg by mouth daily.    . Magnesium 250 MG TABS Take by mouth daily.    . Multiple Vitamin (MULTIVITAMIN WITH MINERALS) TABS tablet Take 1 tablet by mouth daily.    . ranitidine (ZANTAC) 150 MG tablet Take 150 mg by mouth 2 (two) times daily.  3  . Red Yeast Rice Extract (RED YEAST RICE PO) Take by mouth 2 (two) times daily.    Marland Kitchen triamcinolone cream (KENALOG) 0.1 % Apply 1 application topically 2 (two) times daily. Mix one to one with gentle moisturizing lotion; as needed 45 g 1   No current facility-administered medications for this visit.       REVIEW OF SYSTEMS (Negative unless checked)  Constitutional: [] Weight loss  [] Fever  [] Chills Cardiac: [] Chest pain   [] Chest pressure   [] Palpitations   [] Shortness of breath when laying flat   [] Shortness of breath at rest   [x] Shortness of breath with exertion. Vascular:  [] Pain in legs with walking   [] Pain in legs at rest   [] Pain in legs when laying flat   [] Claudication   [] Pain in feet when walking  [] Pain in feet at rest  [] Pain in feet when laying flat   [] History of DVT   [] Phlebitis   [] Swelling in legs   [] Varicose veins   [] Non-healing ulcers Pulmonary:   [] Uses home oxygen   [] Productive cough   [] Hemoptysis   [] Wheeze  [] COPD   [] Asthma Neurologic:  [] Dizziness  [] Blackouts   [] Seizures   [x] History of stroke   [x] History of TIA  [] Aphasia    [] Temporary blindness   [] Dysphagia   [] Weakness or numbness in arms   [] Weakness or numbness in legs Musculoskeletal:  [x] Arthritis   [] Joint swelling   [x] Joint pain   [x] Low back pain Hematologic:  [] Easy bruising  [] Easy bleeding   [] Hypercoagulable state   [] Anemic  [] Hepatitis Gastrointestinal:  [] Blood in stool   [] Vomiting blood  [x] Gastroesophageal reflux/heartburn   [] Abdominal pain Genitourinary:  [] Chronic kidney  disease   [] Difficult urination  [] Frequent urination  [] Burning with urination   [] Hematuria Skin:  [] Rashes   [] Ulcers   [] Wounds Psychological:  [] History of anxiety   []  History of major depression.    Physical Exam BP (!) 158/71   Pulse 64   Resp 17   Ht 5' (1.524 m)   Wt 60.8 kg (134 lb)   BMI 26.17 kg/m  Gen:  WD/WN, NAD. Appears younger stated age Head: [place]/AT, No temporalis wasting. Prominent temp pulse not noted. Ear/Nose/Throat: Hearing grossly intact, nares w/o erythema or drainage, oropharynx w/o Erythema/Exudate Eyes: Conjunctiva clear, sclera non-icteric  Neck: trachea midline.  No bruit or JVD.  Pulmonary:  Good air movement, clear to auscultation bilaterally.  Cardiac: RRR, normal S1, S2, no Murmurs, rubs or gallops. Vascular:  Vessel Right Left  Radial Palpable Palpable                                   Gastrointestinal: soft, non-tender/non-distended.  Musculoskeletal: M/S 5/5 throughout.  Extremities without ischemic changes.  No deformity or atrophy.  Neurologic: Sensation grossly intact in extremities.  Symmetrical.  Speech is fluent. Motor exam as listed above. Psychiatric: Judgment intact, Mood & affect appropriate for pt's clinical situation. Dermatologic: No rashes or ulcers noted.  No cellulitis or open wounds.    Radiology Mm Diag Breast Tomo Bilateral  Result Date: 12/04/2016 CLINICAL DATA:  Follow-up for probably benign left breast calcifications. EXAM: 2D DIGITAL DIAGNOSTIC BILATERAL MAMMOGRAM WITH CAD AND ADJUNCT TOMO  COMPARISON:  Previous exam(s). ACR Breast Density Category c: The breast tissue is heterogeneously dense, which may obscure small masses. FINDINGS: The probably benign calcifications in the medial aspect of the left breast are stable. No suspicious calcifications, masses or areas of distortion are seen in the bilateral breasts. Mammographic images were processed with CAD. IMPRESSION: 1.  Stable left breast calcifications. 2.  No mammographic evidence of malignancy in the bilateral breasts. RECOMMENDATION: Diagnostic mammogram is suggested in 1 year. (Code:DM-B-01Y) I have discussed the findings and recommendations with the patient. Results were also provided in writing at the conclusion of the visit. If applicable, a reminder letter will be sent to the patient regarding the next appointment. BI-RADS CATEGORY  3: Probably benign. Electronically Signed   By: Frederico Hamman M.D.   On: 12/04/2016 10:26    Labs Recent Results (from the past 2160 hour(s))  Surgical pathology     Status: None   Collection Time: 11/04/16  8:20 AM  Result Value Ref Range   SURGICAL PATHOLOGY      Surgical Pathology CASE: [id number] PATIENT: Nicholos Johns Knipfer Surgical Pathology Report     SPECIMEN SUBMITTED: A. Esophagus, random, R/O EOE; bx  CLINICAL HISTORY: None provided  PRE-OPERATIVE DIAGNOSIS: Dysphagia  POST-OPERATIVE DIAGNOSIS: Schatzki ring; mild stricture     DIAGNOSIS: A.  RANDOM ESOPHAGUS; BIOPSY: - SQUAMOUS MUCOSA WITH 3 INTRAEPITHELIAL EOSINOPHILS IN ONE HIGH-POWER FIELD. - NEGATIVE FOR DYSPLASIA AND MALIGNANCY.  Note: The changes are suggestive of reflux in the appropriate clinical setting.   GROSS DESCRIPTION:  A. Labeled: random esophagus biopsy rule out EOE  Tissue fragment(s): multiple  Size: aggregate, 1.1 x 0.3 x 0.1 cm  Description: pink-tan fragments  Entirely submitted in one cassette(s).    Final Diagnosis performed by Glenice Bow, MD.  Electronically  signed 11/06/2016 11:29:20AM    The electronic signature indicates that the named Attending Pathologist has evaluated the specimen  Technical component  performed at [facility], [address], [place], [place] [address] Lab: [phone] Dir: Titus Dubin. Cato Mulligan, MD  Professional component performed at [facility], [facility], [address] [place], [place], [place] [address] Lab: [phone] Dir: Georgiann Cocker. Rubinas, MD    Lipid panel     Status: Abnormal   Collection Time: 11/20/16 11:40 AM  Result Value Ref Range   Cholesterol 258 (H) <200 mg/dL   Triglycerides 170 <017 mg/dL   HDL 59 >49 mg/dL   Total CHOL/HDL Ratio 4.4 <5.0 Ratio   VLDL 21 <30 mg/dL   LDL Cholesterol 449 (H) <100 mg/dL    Assessment/Plan:  Essential hypertension, benign blood pressure control important in reducing the progression of atherosclerotic disease. On appropriate oral medications.   Hx-TIA (transient ischemic attack) With very mild carotid disease, this is less likely to be the source of her TIA.  Hyperlipidemia lipid control important in reducing the progression of atherosclerotic disease.   Carotid atherosclerosis, bilateral She had a CT scan of the neck about 3-4 months ago which I have independently reviewed. This CT scan demonstrates mild carotid atherosclerosis bilaterally with less than 50% stenosis present on each side. There is significant tortuosity present worse on the left than the right.  At this point, I would recommend she continue her aspirin therapy. I would recommend a heart healthy diet and increasing her activity level. Would recommend continuing to monitor her carotids and since it has been through 4 months since his CT scan, a carotid duplex in about 6 months would be reasonable. She will contact our office with problems in the interim. This degree of stenosis would not warrant intervention at current.      Festus Barren 12/30/2016, 12:33 PM   This note was created  with Dragon medical transcription system.  Any errors from dictation are unintentional.

## 2016-12-30 NOTE — Assessment & Plan Note (Signed)
lipid control important in reducing the progression of atherosclerotic disease.   

## 2017-01-01 ENCOUNTER — Other Ambulatory Visit: Payer: Self-pay | Admitting: Nurse Practitioner

## 2017-01-01 DIAGNOSIS — R519 Headache, unspecified: Secondary | ICD-10-CM

## 2017-01-01 DIAGNOSIS — R51 Headache: Principal | ICD-10-CM

## 2017-01-01 DIAGNOSIS — M47812 Spondylosis without myelopathy or radiculopathy, cervical region: Secondary | ICD-10-CM | POA: Diagnosis not present

## 2017-01-08 ENCOUNTER — Ambulatory Visit
Admission: RE | Admit: 2017-01-08 | Discharge: 2017-01-08 | Disposition: A | Discharge status: Home / Self Care | Payer: Medicare HMO | Source: Ambulatory Visit | Attending: Nurse Practitioner | Admitting: Nurse Practitioner

## 2017-01-08 DIAGNOSIS — R519 Headache, unspecified: Secondary | ICD-10-CM

## 2017-01-08 DIAGNOSIS — I6782 Cerebral ischemia: Secondary | ICD-10-CM | POA: Diagnosis not present

## 2017-01-08 DIAGNOSIS — R51 Headache: Secondary | ICD-10-CM | POA: Diagnosis not present

## 2017-01-08 DIAGNOSIS — G319 Degenerative disease of nervous system, unspecified: Secondary | ICD-10-CM | POA: Diagnosis not present

## 2017-01-08 MED ORDER — GADOBENATE DIMEGLUMINE 529 MG/ML IV SOLN
15.0000 mL | Freq: Once | INTRAVENOUS | Status: AC | PRN
  Administered 2017-01-08: 12 mL via INTRAVENOUS

## 2017-01-10 LAB — POCT I-STAT CREATININE: CREATININE: 0.9 mg/dL (ref 0.44–1.00)

## 2017-01-22 ENCOUNTER — Ambulatory Visit (INDEPENDENT_AMBULATORY_CARE_PROVIDER_SITE_OTHER): Payer: Medicare HMO | Admitting: Gastroenterology

## 2017-01-22 ENCOUNTER — Ambulatory Visit: Payer: Medicare HMO | Admitting: Gastroenterology

## 2017-01-22 ENCOUNTER — Encounter: Payer: Self-pay | Admitting: Gastroenterology

## 2017-01-22 VITALS — BP 143/66 | HR 71 | Temp 97.8°F | Ht 60.0 in | Wt 136.2 lb

## 2017-01-22 DIAGNOSIS — R1310 Dysphagia, unspecified: Secondary | ICD-10-CM | POA: Diagnosis not present

## 2017-01-22 NOTE — Progress Notes (Signed)
[name] MD, MRCP(U.K) [address]  [address]  [place], [place] [address]  Main: [phone]  Fax: [phone]   Primary Care Physician: Kerman Passey, MD  Primary Gastroenterologist:  Dr. [name]   Chief Complaint  Patient presents with  . Follow-up    HPI: Yvonne Espinoza is a 76 y.o. female   She is here today for follow up for dysphagia.    Summary of history :  She was initially referred and seen in 09/2016 for dysphagia of 6 months duration , affecting solids , H/o prior dilations of her esophagus.   Interval history   5//2018-  01/22/2017   11/04/16- EGD-Performed dilation of a schatzkis ring to 18 mm  With features of reflux seen on biopsies of the esophagus.   She is on Zantac BID since dilation. Doing well , no issues with swallowing    Current Outpatient Prescriptions  Medication Sig Dispense Refill  . aspirin EC 325 MG tablet Take 325 mg by mouth daily.    . Coenzyme Q10 (CO Q 10) 100 MG CAPS Take by mouth daily.    . folic acid (FOLVITE) 1 MG tablet Take 1 mg by mouth daily.     . hydrochlorothiazide (HYDRODIURIL) 12.5 MG tablet Take 12.5 mg by mouth daily.    . Magnesium 250 MG TABS Take by mouth daily.    . Multiple Vitamin (MULTIVITAMIN WITH MINERALS) TABS tablet Take 1 tablet by mouth daily.    . ranitidine (ZANTAC) 150 MG tablet Take 150 mg by mouth 2 (two) times daily.  3  . Red Yeast Rice Extract (RED YEAST RICE PO) Take by mouth 2 (two) times daily.    Marland Kitchen triamcinolone cream (KENALOG) 0.1 % Apply 1 application topically 2 (two) times daily. Mix one to one with gentle moisturizing lotion; as needed 45 g 1  . cyanocobalamin 1000 MCG tablet Take 500 mcg by mouth 2 (two) times daily.     No current facility-administered medications for this visit.     Allergies as of 01/22/2017 - Review Complete 01/22/2017  Allergen Reaction Noted  . Gabapentin Other (See Comments) 05/01/2014  . Neosporin [neomycin-bacitracin zn-polymyx]   05/01/2014  . Prednisone Other (See Comments) 05/01/2014  . Statins Other (See Comments) 04/20/2015  . Methotrexate Other (See Comments) 09/30/2016  . Methotrexate derivatives  09/30/2016    ROS:  General: Negative for anorexia, weight loss, fever, chills, fatigue, weakness. ENT: Negative for hoarseness, difficulty swallowing , nasal congestion. CV: Negative for chest pain, angina, palpitations, dyspnea on exertion, peripheral edema.  Respiratory: Negative for dyspnea at rest, dyspnea on exertion, cough, sputum, wheezing.  GI: See history of present illness. GU:  Negative for dysuria, hematuria, urinary incontinence, urinary frequency, nocturnal urination.  Endo: Negative for unusual weight change.    Physical Examination:   BP (!) 143/66   Pulse 71   Temp 97.8 F (36.6 C) (Oral)   Ht 5' (1.524 m)   Wt 136 lb 3.2 oz (61.8 kg)   BMI 26.60 kg/m   General: Well-nourished, well-developed in no acute distress.  Eyes: No icterus. Conjunctivae pink. Mouth: Oropharyngeal mucosa moist and pink , no lesions erythema or exudate. Lungs: Clear to auscultation bilaterally. Non-labored. Heart: Regular rate and rhythm, no murmurs rubs or gallops.  Abdomen: Bowel sounds are normal, nontender, nondistended, no hepatosplenomegaly or masses, no abdominal bruits or hernia , no rebound or guarding.   Extremities: No lower extremity edema. No clubbing or deformities. Neuro: Alert and  oriented x 3.  Grossly intact. Skin: Warm and dry, no jaundice.   Psych: Alert and cooperative, normal mood and affect.   Imaging Studies: Mr Laqueta Jean Wo Contrast  Result Date: 01/08/2017 CLINICAL DATA:  Facial pain EXAM: MRI HEAD WITHOUT AND WITH CONTRAST TECHNIQUE: Multiplanar, multiecho pulse sequences of the brain and surrounding structures were obtained without and with intravenous contrast. CONTRAST:  12 mL MultiHance IV COMPARISON:  MRI head 03/22/2008 FINDINGS: Brain: Mild atrophy with progression. Negative for  hydrocephalus. Chronic white matter changes with progression from the prior study. Negative for acute infarct. Negative for hemorrhage or mass. Normal enhancement postcontrast administration. Vascular: Normal arterial flow voids.  Normal venous enhancement. Skull and upper cervical spine: Negative Sinuses/Orbits: Negative Other: None IMPRESSION: No acute abnormality Mild atrophy and mild chronic microvascular ischemic changes with progression since 2009. Electronically Signed   By: Marlan Palau M.D.   On: 01/08/2017 09:26    Assessment and Plan:   Yvonne Espinoza is a 76 y.o. y/o female for dysphagia, S/p dilation of a schatzkis ring 11/2016 . Doing well no new issues   Plan  1. Continue Zantac BID for reflux , counseled on life style changes   Dr [name]  MD,MRCP [facility]) Follow up PRN

## 2017-02-02 ENCOUNTER — Other Ambulatory Visit: Payer: Self-pay | Admitting: Gastroenterology

## 2017-02-02 DIAGNOSIS — R1310 Dysphagia, unspecified: Secondary | ICD-10-CM

## 2017-02-03 DIAGNOSIS — H9203 Otalgia, bilateral: Secondary | ICD-10-CM | POA: Diagnosis not present

## 2017-02-03 DIAGNOSIS — H6982 Other specified disorders of Eustachian tube, left ear: Secondary | ICD-10-CM | POA: Diagnosis not present

## 2017-02-03 DIAGNOSIS — H8111 Benign paroxysmal vertigo, right ear: Secondary | ICD-10-CM | POA: Diagnosis not present

## 2017-02-03 DIAGNOSIS — H903 Sensorineural hearing loss, bilateral: Secondary | ICD-10-CM | POA: Diagnosis not present

## 2017-02-05 DIAGNOSIS — R69 Illness, unspecified: Secondary | ICD-10-CM | POA: Diagnosis not present

## 2017-04-03 DIAGNOSIS — M47812 Spondylosis without myelopathy or radiculopathy, cervical region: Secondary | ICD-10-CM | POA: Diagnosis not present

## 2017-04-03 DIAGNOSIS — R51 Headache: Secondary | ICD-10-CM | POA: Diagnosis not present

## 2017-05-05 DIAGNOSIS — I639 Cerebral infarction, unspecified: Secondary | ICD-10-CM

## 2017-05-05 HISTORY — DX: Cerebral infarction, unspecified: I63.9

## 2017-05-18 DIAGNOSIS — H2513 Age-related nuclear cataract, bilateral: Secondary | ICD-10-CM | POA: Diagnosis not present

## 2017-05-26 ENCOUNTER — Other Ambulatory Visit: Payer: Self-pay

## 2017-05-27 ENCOUNTER — Other Ambulatory Visit: Payer: Self-pay

## 2017-05-27 MED ORDER — RANITIDINE HCL 150 MG PO TABS: 150.0000 mg | ORAL_TABLET | Freq: Two times a day (BID) | ORAL | 0 refills | Status: DC

## 2017-06-01 DIAGNOSIS — H401131 Primary open-angle glaucoma, bilateral, mild stage: Secondary | ICD-10-CM | POA: Diagnosis not present

## 2017-06-17 DIAGNOSIS — H401131 Primary open-angle glaucoma, bilateral, mild stage: Secondary | ICD-10-CM | POA: Diagnosis not present

## 2017-06-21 NOTE — Progress Notes (Signed)
Closing out lab/order note open since:  Jan 2018 

## 2017-06-29 ENCOUNTER — Other Ambulatory Visit: Payer: Self-pay | Admitting: Gastroenterology

## 2017-07-03 ENCOUNTER — Encounter (INDEPENDENT_AMBULATORY_CARE_PROVIDER_SITE_OTHER): Payer: Medicare HMO

## 2017-07-03 ENCOUNTER — Ambulatory Visit (INDEPENDENT_AMBULATORY_CARE_PROVIDER_SITE_OTHER): Payer: Medicare HMO | Admitting: Vascular Surgery

## 2017-07-21 ENCOUNTER — Encounter: Payer: Self-pay | Admitting: *Deleted

## 2017-07-30 ENCOUNTER — Encounter
Admission: RE | Disposition: A | Discharge status: Home / Self Care | Payer: Self-pay | Source: Ambulatory Visit | Attending: Ophthalmology

## 2017-07-30 ENCOUNTER — Ambulatory Visit
Admission: RE | Admit: 2017-07-30 | Discharge: 2017-07-30 | Disposition: A | Discharge status: Home / Self Care | Payer: Medicare HMO | Source: Ambulatory Visit | Attending: Ophthalmology | Admitting: Ophthalmology

## 2017-07-30 ENCOUNTER — Encounter: Payer: Self-pay | Admitting: *Deleted

## 2017-07-30 ENCOUNTER — Ambulatory Visit: Payer: Medicare HMO | Admitting: Anesthesiology

## 2017-07-30 DIAGNOSIS — H2512 Age-related nuclear cataract, left eye: Secondary | ICD-10-CM | POA: Diagnosis present

## 2017-07-30 DIAGNOSIS — Z885 Allergy status to narcotic agent status: Secondary | ICD-10-CM | POA: Insufficient documentation

## 2017-07-30 DIAGNOSIS — H401121 Primary open-angle glaucoma, left eye, mild stage: Secondary | ICD-10-CM | POA: Diagnosis not present

## 2017-07-30 DIAGNOSIS — M1990 Unspecified osteoarthritis, unspecified site: Secondary | ICD-10-CM | POA: Diagnosis not present

## 2017-07-30 DIAGNOSIS — Z7982 Long term (current) use of aspirin: Secondary | ICD-10-CM | POA: Insufficient documentation

## 2017-07-30 DIAGNOSIS — Z7951 Long term (current) use of inhaled steroids: Secondary | ICD-10-CM | POA: Diagnosis not present

## 2017-07-30 DIAGNOSIS — Z79899 Other long term (current) drug therapy: Secondary | ICD-10-CM | POA: Diagnosis not present

## 2017-07-30 HISTORY — PX: INSERTION OF ANTERIOR SEGMENT AQUEOUS DRAINAGE DEVICE (ISTENT): SHX6783

## 2017-07-30 HISTORY — DX: Dysphagia, unspecified: R13.10

## 2017-07-30 HISTORY — DX: Esophageal obstruction: K22.2

## 2017-07-30 HISTORY — DX: Radiculopathy, site unspecified: M54.10

## 2017-07-30 HISTORY — DX: Thoracic aortic aneurysm, without rupture: I71.2

## 2017-07-30 HISTORY — DX: Personal history of other diseases of the digestive system: Z87.19

## 2017-07-30 HISTORY — PX: CATARACT EXTRACTION W/PHACO: SHX586

## 2017-07-30 HISTORY — DX: Hyperlipidemia, unspecified: E78.5

## 2017-07-30 HISTORY — DX: Thoracic aortic aneurysm, without rupture, unspecified: I71.20

## 2017-07-30 HISTORY — DX: Essential (primary) hypertension: I10

## 2017-07-30 SURGERY — PHACOEMULSIFICATION, CATARACT, WITH IOL INSERTION
Anesthesia: Monitor Anesthesia Care | Site: Eye | Laterality: Left | Wound class: Clean

## 2017-07-30 MED ORDER — ERYTHROMYCIN 5 MG/GM OP OINT
TOPICAL_OINTMENT | OPHTHALMIC | Status: DC | PRN
  Administered 2017-07-30: 1 via OPHTHALMIC

## 2017-07-30 MED ORDER — LIDOCAINE HCL (PF) 4 % IJ SOLN
INTRAMUSCULAR | Status: DC | PRN
  Administered 2017-07-30: 09:00:00 via OPHTHALMIC

## 2017-07-30 MED ORDER — PROPOFOL 10 MG/ML IV BOLUS
INTRAVENOUS | Status: AC
  Filled 2017-07-30: qty 20

## 2017-07-30 MED ORDER — ARMC OPHTHALMIC DILATING DROPS
1.0000 "application " | OPHTHALMIC | Status: AC
  Administered 2017-07-30 (×3): 1 via OPHTHALMIC

## 2017-07-30 MED ORDER — SODIUM HYALURONATE 23 MG/ML IO SOLN
INTRAOCULAR | Status: AC
  Filled 2017-07-30: qty 0.6

## 2017-07-30 MED ORDER — POVIDONE-IODINE 5 % OP SOLN
OPHTHALMIC | Status: DC | PRN
  Administered 2017-07-30: 1 via OPHTHALMIC

## 2017-07-30 MED ORDER — MOXIFLOXACIN HCL 0.5 % OP SOLN
OPHTHALMIC | Status: AC
  Filled 2017-07-30: qty 3

## 2017-07-30 MED ORDER — EPINEPHRINE PF 1 MG/ML IJ SOLN
INTRAOCULAR | Status: DC | PRN
  Administered 2017-07-30: 09:00:00 via OPHTHALMIC

## 2017-07-30 MED ORDER — BUPIVACAINE HCL (PF) 0.75 % IJ SOLN
INTRAMUSCULAR | Status: AC
  Filled 2017-07-30: qty 10

## 2017-07-30 MED ORDER — MIDAZOLAM HCL 2 MG/2ML IJ SOLN
INTRAMUSCULAR | Status: DC | PRN
  Administered 2017-07-30: 1 mg via INTRAVENOUS

## 2017-07-30 MED ORDER — NEOMYCIN-POLYMYXIN-DEXAMETH 3.5-10000-0.1 OP OINT
TOPICAL_OINTMENT | OPHTHALMIC | Status: AC
  Filled 2017-07-30: qty 3.5

## 2017-07-30 MED ORDER — ERYTHROMYCIN 5 MG/GM OP OINT
TOPICAL_OINTMENT | OPHTHALMIC | Status: AC
  Filled 2017-07-30: qty 1

## 2017-07-30 MED ORDER — LIDOCAINE HCL (CARDIAC) 20 MG/ML IV SOLN
INTRAVENOUS | Status: DC | PRN
  Administered 2017-07-30: 40 mg via INTRAVENOUS

## 2017-07-30 MED ORDER — FENTANYL CITRATE (PF) 100 MCG/2ML IJ SOLN
INTRAMUSCULAR | Status: AC
  Filled 2017-07-30: qty 2

## 2017-07-30 MED ORDER — PROPOFOL 500 MG/50ML IV EMUL
INTRAVENOUS | Status: DC | PRN
  Administered 2017-07-30: 30 ug via INTRAVENOUS

## 2017-07-30 MED ORDER — MOXIFLOXACIN HCL 0.5 % OP SOLN: 1.0000 [drp] | Freq: Once | OPHTHALMIC | Status: DC

## 2017-07-30 MED ORDER — LIDOCAINE HCL (PF) 4 % IJ SOLN
INTRAOCULAR | Status: DC | PRN
  Administered 2017-07-30: 4 mL via OPHTHALMIC

## 2017-07-30 MED ORDER — EPINEPHRINE PF 1 MG/ML IJ SOLN
INTRAMUSCULAR | Status: AC
  Filled 2017-07-30: qty 2

## 2017-07-30 MED ORDER — MIDAZOLAM HCL 2 MG/2ML IJ SOLN
INTRAMUSCULAR | Status: AC
  Filled 2017-07-30: qty 2

## 2017-07-30 MED ORDER — POVIDONE-IODINE 5 % OP SOLN
OPHTHALMIC | Status: AC
  Filled 2017-07-30: qty 30

## 2017-07-30 MED ORDER — HYALURONIDASE HUMAN 150 UNIT/ML IJ SOLN
INTRAMUSCULAR | Status: AC
  Filled 2017-07-30: qty 1

## 2017-07-30 MED ORDER — MOXIFLOXACIN HCL 0.5 % OP SOLN
OPHTHALMIC | Status: DC | PRN
  Administered 2017-07-30: 0.2 mL via OPHTHALMIC

## 2017-07-30 MED ORDER — SODIUM HYALURONATE 10 MG/ML IO SOLN
INTRAOCULAR | Status: DC | PRN
  Administered 2017-07-30: 0.55 mL via INTRAOCULAR

## 2017-07-30 MED ORDER — SODIUM CHLORIDE 0.9 % IV SOLN
INTRAVENOUS | Status: DC
  Administered 2017-07-30: 09:00:00 via INTRAVENOUS

## 2017-07-30 MED ORDER — LIDOCAINE HCL (PF) 4 % IJ SOLN
INTRAMUSCULAR | Status: AC
  Filled 2017-07-30: qty 5

## 2017-07-30 MED ORDER — ARMC OPHTHALMIC DILATING DROPS
OPHTHALMIC | Status: AC
  Administered 2017-07-30: 1 via OPHTHALMIC
  Filled 2017-07-30: qty 0.4

## 2017-07-30 MED ORDER — SODIUM HYALURONATE 23 MG/ML IO SOLN
INTRAOCULAR | Status: DC | PRN
  Administered 2017-07-30: 0.6 mL via INTRAOCULAR

## 2017-07-30 MED ORDER — FENTANYL CITRATE (PF) 100 MCG/2ML IJ SOLN
INTRAMUSCULAR | Status: DC | PRN
  Administered 2017-07-30: 50 ug via INTRAVENOUS

## 2017-07-30 MED ORDER — LIDOCAINE HCL (PF) 2 % IJ SOLN
INTRAMUSCULAR | Status: AC
  Filled 2017-07-30: qty 10

## 2017-07-30 SURGICAL SUPPLY — 19 items
BANDAGE EYE OVAL (MISCELLANEOUS) ×4 IMPLANT
DISSECTOR HYDRO NUCLEUS 50X22 (MISCELLANEOUS) ×2 IMPLANT
GLOVE BIO SURGEON STRL SZ8 (GLOVE) ×2 IMPLANT
GLOVE BIOGEL M 6.5 STRL (GLOVE) ×2 IMPLANT
GLOVE SURG LX 7.5 STRW (GLOVE) ×1
GLOVE SURG LX STRL 7.5 STRW (GLOVE) ×1 IMPLANT
GOWN STRL REUS W/ TWL LRG LVL3 (GOWN DISPOSABLE) ×2 IMPLANT
GOWN STRL REUS W/TWL LRG LVL3 (GOWN DISPOSABLE) ×2
ISTENT OPTHAL 1X.33 ADULT LF (Permanent Stent) ×2 IMPLANT
LABEL CATARACT MEDS ST (LABEL) ×2 IMPLANT
LENS IOL TECNIS ITEC 18.5 (Intraocular Lens) ×2 IMPLANT
PACK CATARACT (MISCELLANEOUS) ×2 IMPLANT
PACK CATARACT KING (MISCELLANEOUS) ×2 IMPLANT
PACK EYE AFTER SURG (MISCELLANEOUS) ×2 IMPLANT
SOL BSS BAG (MISCELLANEOUS) ×2
SOLUTION BSS BAG (MISCELLANEOUS) ×1 IMPLANT
STENT 'ISTENT OPTH 1X.33 ADLT (Permanent Stent) ×1 IMPLANT
WATER STERILE IRR 250ML POUR (IV SOLUTION) ×2 IMPLANT
WIPE NON LINTING 3.25X3.25 (MISCELLANEOUS) ×2 IMPLANT

## 2017-07-30 NOTE — Transfer of Care (Signed)
Immediate Anesthesia Transfer of Care Note  Patient: Yvonne Espinoza  Procedure(s) Performed: CATARACT EXTRACTION PHACO AND INTRAOCULAR LENS PLACEMENT (IOC) (Left Eye) INSERTION OF ANTERIOR SEGMENT AQUEOUS DRAINAGE DEVICE (ISTENT) (Left Eye)  Patient Location: PACU  Anesthesia Type:MAC  Level of Consciousness: awake  Airway & Oxygen Therapy: Patient Spontanous Breathing  Post-op Assessment: Report given to RN and Post -op Vital signs reviewed and stable  Post vital signs: Reviewed and stable  Last Vitals:  Vitals Value Taken Time  BP 141/48 0934  Temp    Pulse 58   Resp 16   SpO2 99     Last Pain:  Vitals:   07/30/17 0740  PainSc: 0-No pain         Complications: No apparent anesthesia complications

## 2017-07-30 NOTE — Discharge Instructions (Signed)
Eye Surgery Discharge Instructions  Expect mild scratchy sensation or mild soreness. DO NOT RUB YOUR EYE!  The day of surgery:  Minimal physical activity, but bed rest is not required  No reading, computer work, or close hand work  No bending, lifting, or straining.  May watch TV  For 24 hours:  No driving, legal decisions, or alcoholic beverages  Safety precautions  Eat anything you prefer: It is better to start with liquids, then soup then solid foods.  _____ Eye patch should be worn until postoperative exam tomorrow.  ____ Solar shield eyeglasses should be worn for comfort in the sunlight/patch while sleeping  Resume all regular medications including aspirin or Coumadin if these were discontinued prior to surgery. You may shower, bathe, shave, or wash your hair. Tylenol may be taken for mild discomfort.  Call your doctor if you experience significant pain, nausea, or vomiting, fever > 101 or other signs of infection. [phone] or [phone] Specific instructions:  Follow-up Information    Nevada Crane, MD Follow up.   Specialty:  Ophthalmology Why:  07/31/17 at 9:30 Contact information: [address] [place] [address] [phone]          Eye Surgery Discharge Instructions  Expect mild scratchy sensation or mild soreness. DO NOT RUB YOUR EYE!  The day of surgery:  Minimal physical activity, but bed rest is not required  No reading, computer work, or close hand work  No bending, lifting, or straining.  May watch TV  For 24 hours:  No driving, legal decisions, or alcoholic beverages  Safety precautions  Eat anything you prefer: It is better to start with liquids, then soup then solid foods.  _____ Eye patch should be worn until postoperative exam tomorrow.  ____ Solar shield eyeglasses should be worn for comfort in the sunlight/patch while sleeping  Resume all regular medications including aspirin or Coumadin if  these were discontinued prior to surgery. You may shower, bathe, shave, or wash your hair. Tylenol may be taken for mild discomfort.  Call your doctor if you experience significant pain, nausea, or vomiting, fever > 101 or other signs of infection. [phone] or [phone] Specific instructions:  Follow-up Information    Nevada Crane, MD Follow up.   Specialty:  Ophthalmology Why:  07/31/17 at 9:30 Contact information: [address] [place] [address] [phone]

## 2017-07-30 NOTE — H&P (Signed)
The History and Physical notes are on paper, have been signed, and are to be scanned.   I have examined the patient and there are no changes to the H&P.   Willey Blade 07/30/2017 8:48 AM

## 2017-07-30 NOTE — Anesthesia Post-op Follow-up Note (Signed)
Anesthesia QCDR form completed.        

## 2017-07-30 NOTE — Anesthesia Preprocedure Evaluation (Signed)
Anesthesia Evaluation  Patient identified by MRN, date of birth, ID band Patient awake    Reviewed: Allergy & Precautions, NPO status , Patient's Chart, lab work & pertinent test results, Unable to perform ROS - Chart review only  History of Anesthesia Complications Negative for: history of anesthetic complications  Airway Mallampati: II  TM Distance: >3 FB Neck ROM: Full    Dental  (+) Dental Advisory Given, Upper Dentures   Pulmonary neg pulmonary ROS,    breath sounds clear to auscultation       Cardiovascular hypertension, Pt. on medications (-) angina+ Peripheral Vascular Disease  (-) CAD, (-) Past MI, (-) Cardiac Stents and (-) CABG (-) dysrhythmias (-) Valvular Problems/Murmurs Rhythm:Regular Rate:Normal     Neuro/Psych neg Seizures TIA Neuromuscular disease CVA negative psych ROS   GI/Hepatic Neg liver ROS, hiatal hernia, GERD  ,  Endo/Other  negative endocrine ROS  Renal/GU negative Renal ROS  negative genitourinary   Musculoskeletal  (+) Arthritis ,   Abdominal   Peds negative pediatric ROS (+)  Hematology negative hematology ROS (+)   Anesthesia Other Findings Past Medical History: No date: Abscess     Comment: colon No date: Arthritis     Comment: RA No date: Collagen vascular disease (HCC)     Comment: Rhematoid Arthritis No date: Colovesical fistula     Comment: 2016 09/08/2016: Degenerative cervical spinal stenosis No date: Diverticulitis 08/14/2016: DNAR (do not attempt resuscitation)     Comment: Discussed in person August 14, 2016 No date: GERD (gastroesophageal reflux disease) No date: Kidney cysts 09/08/2016: Lung nodule < 6cm on CT No date: Reflux esophagitis 04/25/2015: SBO (small bowel obstruction) (HCC)     Comment: partial  No date: Spinal stenosis No date: Stroke [facility])     Comment: TIA 2009 09/08/2016: Thoracic aortic atherosclerosis (HCC) No date: TIA (transient ischemic attack)    Comment: 2209,2010 5/14: TMJ (temporomandibular joint disorder) No date: Wears glasses  Reproductive/Obstetrics negative OB ROS                             Anesthesia Physical  Anesthesia Plan  ASA: III  Anesthesia Plan: MAC   Post-op Pain Management:    Induction:   PONV Risk Score and Plan:   Airway Management Planned: Nasal Cannula and Natural Airway  Additional Equipment:   Intra-op Plan:   Post-operative Plan:   Informed Consent: I have reviewed the patients History and Physical, chart, labs and discussed the procedure including the risks, benefits and alternatives for the proposed anesthesia with the patient or authorized representative who has indicated his/her understanding and acceptance.   Dental advisory given  Plan Discussed with: CRNA and Anesthesiologist  Anesthesia Plan Comments: (H/O diverticulitis with abscess S/P colectomy with colostomy H/O TIA  [facility]audy)        Anesthesia Quick Evaluation

## 2017-07-30 NOTE — Anesthesia Postprocedure Evaluation (Signed)
Anesthesia Post Note  Patient: Zakara Parkey Stodghill  Procedure(s) Performed: CATARACT EXTRACTION PHACO AND INTRAOCULAR LENS PLACEMENT (IOC) (Left Eye) INSERTION OF ANTERIOR SEGMENT AQUEOUS DRAINAGE DEVICE (ISTENT) (Left Eye)  Patient location during evaluation: Short Stay Anesthesia Type: MAC Level of consciousness: awake and awake and alert Pain management: pain level controlled Vital Signs Assessment: post-procedure vital signs reviewed and stable Respiratory status: spontaneous breathing Cardiovascular status: blood pressure returned to baseline Postop Assessment: no headache Anesthetic complications: no     Last Vitals:  Vitals:   07/30/17 0740 07/30/17 0934  BP: (!) 142/55 (!) 141/48  Pulse: 66 (!) 57  Resp: 18 18  Temp: 36.6 C (!) 36.2 C  SpO2: 99% 99%    Last Pain:  Vitals:   07/30/17 0934  TempSrc: Temporal  PainSc: 0-No pain                 Buckner Malta

## 2017-07-30 NOTE — Op Note (Signed)
OPERATIVE NOTE  PERIAN TEDDER [id number] 07/30/2017  PREOPERATIVE DIAGNOSIS:   1.  Mild open angle glaucoma, left eye.  N56.2130 2.  Nuclear sclerotic cataract left eye.  H25.12   POSTOPERATIVE DIAGNOSIS:    same.   PROCEDURE:   1.  Placement of trabecular bypass stent (istent). CPT 0191T 2. Phacoemusification with posterior chamber intraocular lens placement of the left eye  CPT [phone]   LENS: Implant Name Type Inv. Item Serial No. Manufacturer Lot No. LRB No. Used  LENS IOL DIOP 18.5 - [id number] Intraocular Lens LENS IOL DIOP 18.5 [id number] AMO  Left 1  ISTENT OPTHAL 1X.33 ADULT LF - [id number] Permanent Stent ISTENT OPTHAL 1X.33 ADULT LF [id number] US0060 [employer] [id number] Left 1       PCB00 +18.5   ULTRASOUND TIME: 0 minutes 21.4 seconds.  CDE 2.59   SURGEON:  Benay Pillow, MD, MPH  ANESTHESIOLOGIST: Anesthesiologist: Gunnar Fusi, MD CRNA: Nelda Marseille, CRNA; Allean Found, CRNA   ANESTHESIA:  MAC with retrobulbar block of 50/50% mix of 0.75% bupivicaine, and 4% preservative free lidocaine with a small amount of vitrase and intracameral preservative-free intracameral lidocaine 4%.  ESTIMATED BLOOD LOSS: less than 1 mL.   COMPLICATIONS:  None.   DESCRIPTION OF PROCEDURE:  The patient was identified in the holding room and transported to the operating room.   The patient was placed in the supine position under the operating microscope.   A retrobulbar block was performed with 3 cc.  The left eye was prepped and draped in the usual sterile ophthalmic fashion.   A 1.0 millimeter clear-corneal paracentesis was made at the 5:00 position. 0.5 ml of preservative-free 1% lidocaine with epinephrine was injected into the anterior chamber.  The anterior chamber was filled with Healon 5 viscoelastic.  A 2.4 millimeter keratome was used to make a near-clear corneal incision at the 2:30 position.   Attention was turned to the istent.  The patients head was  turned and the microscope was tilted to 035 degrees.  Ocular instruments/Glaukos OAL/H2 gonioprism was used with IPC05 (iclip) coupled with Healon 5 on the cornea was used to visualize the trabecular meshwork. The istent was opened and introduced into the eye.  The meshwork was engaged with the tip of the iStent and then using the inserter, the iStent slid down Schlemm's canal until the stent was well seated.  The stent was then released from the inserter.  The iStent was inspected and in good position.  Next, attention was turned to the phacoemulsification A curvilinear capsulorrhexis was made with a cystotome and capsulorrhexis forceps.  Balanced salt solution was used to hydrodissect and hydrodelineate the nucleus.   Phacoemulsification was then used in stop and chop fashion to remove the lens nucleus and epinucleus.  The remaining cortex was then removed using the irrigation and aspiration handpiece. Healon was then placed into the capsular bag to distend it for lens placement.  A lens was then injected into the capsular bag.  The remaining viscoelastic was aspirated.   Wounds were hydrated with balanced salt solution.  The anterior chamber was inflated to a physiologic pressure with balanced salt solution.   Intracameral vigamox 0.1 mL undiluted was injected into the eye and a drop placed onto the ocular surface.  No wound leaks were noted.  Erythromycin ophthalmic ointment, a patch and shield were placed.  The patient was taken to the recovery room in stable condition without complications of anesthesia or surgery   Benay Pillow 07/30/2017,  9:34 AM

## 2017-07-30 NOTE — Anesthesia Procedure Notes (Signed)
Procedure Name: MAC Date/Time: 07/30/2017 9:00 AM Performed by: Allean Found, CRNA Pre-anesthesia Checklist: Patient identified, Emergency Drugs available, Suction available, Patient being monitored and Timeout performed Patient Re-evaluated:Patient Re-evaluated prior to induction Oxygen Delivery Method: Nasal cannula Placement Confirmation: positive ETCO2

## 2017-08-17 ENCOUNTER — Encounter: Payer: Self-pay | Admitting: Family Medicine

## 2017-08-17 ENCOUNTER — Ambulatory Visit (INDEPENDENT_AMBULATORY_CARE_PROVIDER_SITE_OTHER): Payer: Medicare HMO | Admitting: Family Medicine

## 2017-08-17 VITALS — BP 118/78 | HR 76 | Temp 98.1°F | Resp 16 | Ht 60.0 in | Wt 137.4 lb

## 2017-08-17 DIAGNOSIS — Z66 Do not resuscitate: Secondary | ICD-10-CM | POA: Diagnosis not present

## 2017-08-17 DIAGNOSIS — Z87898 Personal history of other specified conditions: Secondary | ICD-10-CM

## 2017-08-17 DIAGNOSIS — I6523 Occlusion and stenosis of bilateral carotid arteries: Secondary | ICD-10-CM

## 2017-08-17 DIAGNOSIS — E782 Mixed hyperlipidemia: Secondary | ICD-10-CM

## 2017-08-17 DIAGNOSIS — Z8673 Personal history of transient ischemic attack (TIA), and cerebral infarction without residual deficits: Secondary | ICD-10-CM

## 2017-08-17 DIAGNOSIS — Z780 Asymptomatic menopausal state: Secondary | ICD-10-CM

## 2017-08-17 DIAGNOSIS — Z23 Encounter for immunization: Secondary | ICD-10-CM

## 2017-08-17 DIAGNOSIS — Z0001 Encounter for general adult medical examination with abnormal findings: Secondary | ICD-10-CM | POA: Diagnosis not present

## 2017-08-17 DIAGNOSIS — Z0000 Encounter for general adult medical examination without abnormal findings: Secondary | ICD-10-CM

## 2017-08-17 DIAGNOSIS — I700 Atherosclerosis of aorta: Secondary | ICD-10-CM | POA: Diagnosis not present

## 2017-08-17 DIAGNOSIS — Z5181 Encounter for therapeutic drug level monitoring: Secondary | ICD-10-CM

## 2017-08-17 LAB — COMPLETE METABOLIC PANEL WITH GFR
AG RATIO: 1.6 (calc) (ref 1.0–2.5)
ALT: 18 U/L (ref 6–29)
AST: 20 U/L (ref 10–35)
Albumin: 4.3 g/dL (ref 3.6–5.1)
Alkaline phosphatase (APISO): 75 U/L (ref 33–130)
BILIRUBIN TOTAL: 0.5 mg/dL (ref 0.2–1.2)
BUN: 15 mg/dL (ref 7–25)
CALCIUM: 9.8 mg/dL (ref 8.6–10.4)
CO2: 31 mmol/L (ref 20–32)
Chloride: 103 mmol/L (ref 98–110)
Creat: 0.81 mg/dL (ref 0.60–0.93)
GFR, EST NON AFRICAN AMERICAN: 71 mL/min/{1.73_m2} (ref >=60)
GFR, Est African American: 82 mL/min/{1.73_m2} (ref >=60)
Globulin: 2.7 g/dL (calc) (ref 1.9–3.7)
Glucose, Bld: 86 mg/dL (ref 65–99)
POTASSIUM: 4.4 mmol/L (ref 3.5–5.3)
SODIUM: 142 mmol/L (ref 135–146)
Total Protein: 7 g/dL (ref 6.1–8.1)

## 2017-08-17 LAB — CBC WITH DIFFERENTIAL/PLATELET
BASOS PCT: 0.7 %
Basophils Absolute: 60 cells/uL (ref 0–200)
EOS ABS: 179 {cells}/uL (ref 15–500)
Eosinophils Relative: 2.1 %
HEMATOCRIT: 44.7 % (ref 35.0–45.0)
Hemoglobin: 15 g/dL (ref 11.7–15.5)
LYMPHS ABS: 1972 {cells}/uL (ref 850–3900)
MCH: 30.1 pg (ref 27.0–33.0)
MCHC: 33.6 g/dL (ref 32.0–36.0)
MCV: 89.8 fL (ref 80.0–100.0)
MPV: 10.2 fL (ref 7.5–12.5)
Monocytes Relative: 9.5 %
Neutro Abs: 5483 cells/uL (ref 1500–7800)
Neutrophils Relative %: 64.5 %
Platelets: 340 10*3/uL (ref 140–400)
RBC: 4.98 10*6/uL (ref 3.80–5.10)
RDW: 13.5 % (ref 11.0–15.0)
Total Lymphocyte: 23.2 %
WBC: 8.5 10*3/uL (ref 3.8–10.8)
WBCMIX: 808 {cells}/uL (ref 200–950)

## 2017-08-17 LAB — LIPID PANEL
Cholesterol: 279 mg/dL — ABNORMAL HIGH (ref <200)
HDL: 54 mg/dL (ref >50)
LDL Cholesterol (Calc): 180 mg/dL (calc) — ABNORMAL HIGH
NON-HDL CHOLESTEROL (CALC): 225 mg/dL — AB (ref <130)
TRIGLYCERIDES: 254 mg/dL — AB (ref <150)
Total CHOL/HDL Ratio: 5.2 (calc) — ABNORMAL HIGH (ref <5.0)

## 2017-08-17 NOTE — Patient Instructions (Addendum)
Consider getting the new shingles vaccine called Shingrix; that is available for individuals 76 years of age and older, and is recommended even if you have had shingles in the past and/or already received the old shingles vaccine (Zostavax); it is a two-part series, and is available at many local pharmacies [place] to get on or after May 15th  You have received the Prevnar vaccine (PCV-13) and you will not need another booster of this for the rest of your life per current ACIP guidelines  Ask your pharmacist or your insurance company if Repatha or Praluent is covered, to help lower cholesterol  We'll get labs today If you have not heard anything from my staff in a week about any orders/referrals/studies from today, please contact us here to follow-up [phone]  Please do call to schedule your bone density study and mammogram; the number to schedule one at either [facility] or [facility] is [phone] or [phone]  Please call [phone] to schedule your imaging test (carotid) Please wait 2-3 days after the order has been placed to call and get your test scheduled

## 2017-08-17 NOTE — Assessment & Plan Note (Signed)
Goal LDL less than 70 

## 2017-08-17 NOTE — Assessment & Plan Note (Signed)
Check labs 

## 2017-08-17 NOTE — Assessment & Plan Note (Signed)
Screening DEXA

## 2017-08-17 NOTE — Assessment & Plan Note (Signed)
USPSTF grade A and B recommendations reviewed with patient; age-appropriate recommendations, preventive care, screening tests, etc discussed and encouraged; healthy living encouraged; see AVS for patient education given to patient  

## 2017-08-17 NOTE — Progress Notes (Signed)
Patient: Yvonne Espinoza, Female    DOB: 05/19/1940, 77 y.o.   MRN: [id number]  Visit Date: 08/17/2017  Today's Provider: Baruch Gouty, MD   Chief Complaint  Patient presents with  . Medicare Wellness    Subjective:   Yvonne Espinoza is a 77 y.o. female who presents today for her Subsequent Annual Wellness Visit.  Had cataract surgery, left eye; "terrible"; could see the next day, but the next week, could hardly see at all; can't read well; they changed the drops and got a little better, but doesn't have her sight back; inflammation around the iris; put her on anti-inflammatory gtts four times a day; thinks it is helping  Carotid plaque on CT scan; saw Dr. Wyn Quaker; she canceled carotid US; she changed her diet; has not had the US done yet  Care team updated  USPSTF grade A and B recommendations Depression:  Depression screen [facility] 2/9 08/17/2017 11/20/2016 10/15/2016 08/21/2016 08/14/2016  Decreased Interest 0 0 0 0 0  Down, Depressed, Hopeless 0 0 0 0 0  PHQ - 2 Score 0 0 0 0 0   Hypertension: BP Readings from Last 3 Encounters:  08/17/17 118/78  07/30/17 (!) 146/58  01/22/17 (!) 143/66   Obesity: Wt Readings from Last 3 Encounters:  08/17/17 137 lb 6.4 oz (62.3 kg)  07/30/17 130 lb (59 kg)  01/22/17 136 lb 3.2 oz (61.8 kg)   BMI Readings from Last 3 Encounters:  08/17/17 26.83 kg/m  07/30/17 25.39 kg/m  01/22/17 26.60 kg/m    Skin cancer: sees derm, has a mole on the right side, benign light brown keratosis Lung cancer:  Never smoker Breast cancer: aug 2018; next due aug 2019 Colorectal cancer: last Dec 2015; ten year pass says patient; now 77 years of age anyway Cervical cancer screening: 76 HIV, hep B, hep C: not interested STD testing and prevention (chl/gon/syphilis): not intersted Intimate partner violence: no abuse Contraception: n/a Osteoporosis: greens, peas, not doing any dairy; gets on ladders occasionally Fall prevention/vitamin D: discussed; multiple  vitamin Immunizations: discussed, pcv-13 today Diet: not much fatty meats; just one piece of bacon twice a month Exercise: walking every day, push mower Alcohol: rarely Tobacco use: never  AAA: n/a Aspirin: taking aspirin daily; no bleeding or bruising Glucose:  Glucose  Date Value Ref Range Status  06/28/2015 85 65 - 99 mg/dL Final   Glucose, Bld  Date Value Ref Range Status  08/14/2016 89 65 - 99 mg/dL Final  08/65/7846 94 65 - 99 mg/dL Final  96/29/5284 62 (L) 65 - 99 mg/dL Final   Glucose-Capillary  Date Value Ref Range Status  06/29/2014 186 (H) 70 - 99 mg/dL Final  13/24/4010 272 (H) 70 - 99 mg/dL Final  53/66/4403 474 (H) 70 - 99 mg/dL Final   Lipids: room for improvement; cannot take statins at all;  Lab Results  Component Value Date   CHOL 258 (H) 11/20/2016   CHOL 247 (H) 08/14/2016   CHOL 249 (H) 02/13/2016   Lab Results  Component Value Date   HDL 59 11/20/2016   HDL 57 08/14/2016   HDL 49 02/13/2016   Lab Results  Component Value Date   LDLCALC 178 (H) 11/20/2016   LDLCALC 159 (H) 08/14/2016   LDLCALC 165 (H) 02/13/2016   Lab Results  Component Value Date   TRIG 104 11/20/2016   TRIG 157 (H) 08/14/2016   TRIG 174 (H) 02/13/2016   Lab Results  Component Value Date   CHOLHDL 4.4  11/20/2016   CHOLHDL 4.3 08/14/2016   CHOLHDL 5.1 (H) 02/13/2016   No results found for: LDLDIRECT   Review of Systems  Past Medical History:  Diagnosis Date  . Abscess    colon  . Arthritis    RA, degenerative disc disease  . Collagen vascular disease (HCC)    Rhematoid Arthritis  . Colovesical fistula    2016  . Degenerative cervical spinal stenosis 09/08/2016  . Diverticulitis   . DNAR (do not attempt resuscitation) 08/14/2016   Discussed in person August 14, 2016  . Dysphagia   . GERD (gastroesophageal reflux disease)   . History of hiatal hernia   . Hyperlipidemia   . Hypertension   . Kidney cysts   . Lung nodule < 6cm on CT 09/08/2016  .  Radiculopathy   . Reflux esophagitis   . SBO (small bowel obstruction) (HCC) 04/25/2015   partial   . Schatzki's ring 2018   has been stretched several times  . Spinal stenosis   . Stroke [facility]) 2019   TIA 2009, told dr. dew that she has had them as recent as last year  . Thoracic aortic aneurysm (HCC)   . Thoracic aortic atherosclerosis (HCC) 09/08/2016  . TIA (transient ischemic attack)    2209,2010  . 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 Wears glasses     Past Surgical History:  Procedure Laterality Date  . CARDIOVASCULAR STRESS TEST  08/08/11 and 06/07/10  . CATARACT EXTRACTION W/PHACO Left 07/30/2017   Procedure: CATARACT EXTRACTION PHACO AND INTRAOCULAR LENS PLACEMENT (IOC);  Surgeon: Nevada Crane, MD;  Location: [facility] ORS;  Service: Ophthalmology;  Laterality: Left;  [place] 00:21.4 AP% 12.1 CDE 2.59 Fluid Pack Lot # [id number]  . COLOSTOMY REVERSAL  09/26/2014   dr wyatt  . COLOSTOMY REVISION N/A 06/26/2014   Procedure: SIGMOID COLECTOMY WITH BLADDER REPAIR;  Surgeon: Frederik Schmidt, MD;  Location[facility] OR;  Service: General;  Laterality: N/A;  . CYSTOSCOPY W/ URETERAL STENT PLACEMENT Left 06/26/2014   Procedure: BOARI FLAP LEFT URETERAL REIMPLANT;  Surgeon: Chelsea Aus, MD;  Location: [facility] OR;  Service: Urology;  Laterality: Left;  . DILATION AND CURETTAGE OF UTERUS    . ESOPHAGEAL DILATION  10/10/08  . esophageal stretch  2018   ring stretched  . ESOPHAGOGASTRODUODENOSCOPY (EGD) WITH PROPOFOL N/A 11/04/2016   Procedure: ESOPHAGOGASTRODUODENOSCOPY (EGD) WITH PROPOFOL WITH DILATION;  Surgeon: [name], MD;  Location: [facility] ENDOSCOPY;  Service: Endoscopy;  Laterality: N/A;  . FLEXIBLE SIGMOIDOSCOPY N/A 09/26/2014   Procedure: RIGID SIGMOIDOSCOPY;  Surgeon: Jimmye Norman, MD;  Location: [facility] OR;  Service: General;  Laterality: N/A;  . ILEOSTOMY N/A 06/26/2014   Procedure: LOOP ILEOSTOMY;  Surgeon: Frederik Schmidt, MD;  Location: [facility] OR;  Service: General;  Laterality: N/A;  . INSERTION OF  ANTERIOR SEGMENT AQUEOUS DRAINAGE DEVICE (ISTENT) Left 07/30/2017   Procedure: INSERTION OF ANTERIOR SEGMENT AQUEOUS DRAINAGE DEVICE (ISTENT);  Surgeon: Nevada Crane, MD;  Location: [facility] ORS;  Service: Ophthalmology;  Laterality: Left;  . KNEE ARTHROSCOPY Bilateral   . SHOULDER SURGERY     right 2010  . TRANSVERSE LOOP COLOSTOMY N/A 09/26/2014   Procedure: LOOP COLOSTOMY REVERESAL;  Surgeon: Jimmye Norman, MD;  Location: [facility] OR;  Service: General;  Laterality: N/A;    Family History  Problem Relation Age of Onset  . Diabetes Mother        pre dm  . Diabetes Father   . Heart attack Father   . Heart disease Father   .  Diabetes Sister   . Stroke Sister   . Glaucoma Brother   . Diabetes Brother   . Diabetes Sister   . Hypertension Son   . Hyperlipidemia Son   . Diabetes Son   . Stroke Maternal Aunt   . Diabetes Maternal Grandfather   . Cancer Neg Hx   . COPD Neg Hx   . Breast cancer Neg Hx     Social History   Tobacco Use  . Smoking status: Never Smoker  . Smokeless tobacco: Never Used  Substance Use Topics  . Alcohol use: Yes    Comment: "rarely"  . Drug use: No    Outpatient Encounter Medications as of 08/17/2017  Medication Sig  . aspirin EC 325 MG tablet Take 325 mg by mouth daily.  . Coenzyme Q10 (CO Q 10) 100 MG CAPS Take 100 mg by mouth daily.   . cyanocobalamin 1000 MCG tablet Take 3,000 mcg by mouth 2 (two) times daily.   Marland Kitchen FIBER ADULT GUMMIES PO Take 5 g by mouth 2 (two) times daily.  . fluticasone (FLONASE) 50 MCG/ACT nasal spray Place 1 spray into both nostrils daily as needed for allergies.  . folic acid (FOLVITE) 400 MCG tablet Take 400 mcg by mouth daily.   Marland Kitchen ketotifen (ZADITOR) 0.025 % ophthalmic solution Place 1 drop into both eyes daily.  Marland Kitchen latanoprost (XALATAN) 0.005 % ophthalmic solution Place 1 drop into both eyes at bedtime.  . Lutein 20 MG CAPS Take 40 mg by mouth daily.  . Magnesium 250 MG TABS Take 250 mg by mouth daily.   . Multiple Vitamin  (MULTIVITAMIN WITH MINERALS) TABS tablet Take 1 tablet by mouth daily.  Bertram Gala Glycol-Propyl Glycol (SYSTANE ULTRA OP) Place 1 drop into both eyes 2 (two) times daily as needed (for dry eyes).   . ranitidine (ZANTAC) 150 MG tablet TAKE 1 TABLET BY MOUTH TWICE A DAY (Patient taking differently: TAKE 1 TABLET BY PRN)  . Red Yeast Rice 600 MG TABS Take 600 mg by mouth 2 (two) times daily.   Marland Kitchen triamcinolone cream (KENALOG) 0.1 % Apply 1 application topically 2 (two) times daily. Mix one to one with gentle moisturizing lotion; as needed  . Melatonin 3 MG CAPS Take 3 mg by mouth at bedtime.   No facility-administered encounter medications on file as of 08/17/2017.     Functional Ability / Safety Screening 1.  Was the timed Get Up and Go test less than 12 seconds?  yes 2.  Does the patient need help with the phone, transportation, shopping,      preparing meals, housework, laundry, medications, or managing money?  no 3.  Does the patient's home have:  loose throw rugs in the hallway?   no      Grab bars in the bathroom? no      Handrails on the stairs?   yes      Good lighting?   yes 4.  Has the patient noticed any hearing difficulties?   no  Advanced Directives Does patient have a HCPOA?    yes If yes, name and contact information: husband, home number [phone] Does patient have a living will or MOST form?  yes DNR confirmed   Fall Risk Assessment See under rooming  Depression Screen See under rooming Depression screen [facility] 2/9 08/17/2017 11/20/2016 10/15/2016 08/21/2016 08/14/2016  Decreased Interest 0 0 0 0 0  Down, Depressed, Hopeless 0 0 0 0 0  PHQ - 2 Score 0 0 0 0  0    Objective:   Vitals: BP 118/78   Pulse 76   Temp 98.1 F (36.7 C) (Oral)   Resp 16   Ht 5' (1.524 m)   Wt 137 lb 6.4 oz (62.3 kg)   SpO2 95%   BMI 26.83 kg/m  Body mass index is 26.83 kg/m. No exam data present  Physical Exam  Constitutional: She appears well-developed and well-nourished.   HENT:  Mouth/Throat: Mucous membranes are normal.  Eyes: EOM are normal. No scleral icterus.  Neck: Carotid bruit is not present.  Cardiovascular: Normal rate and regular rhythm.  Pulmonary/Chest: Effort normal and breath sounds normal.  Psychiatric: She has a normal mood and affect. Her behavior is normal.   Mood/affect:  Euthymic Appearance:  neat  6CIT Screen 08/17/2017 08/14/2016  What Year? 0 points 0 points  What month? 0 points 0 points  What time? 0 points 0 points  Count back from 20 0 points 0 points  Months in reverse 2 points 4 points  Repeat phrase 2 points 0 points  Total Score 4 4    Assessment & Plan:     Annual Wellness Visit  Reviewed patient's Family Medical History Reviewed and updated list of patient's medical providers Assessment of cognitive impairment was done Assessed patient's functional ability Established a written schedule for health screening services Health Risk Assessent Completed and Reviewed  Exercise Activities and Dietary recommendations Goals    . DIET - INCREASE WATER INTAKE (pt-stated)       Immunization History  Administered Date(s) Administered  . Influenza, High Dose Seasonal PF 01/21/2016  . Influenza-Unspecified 03/19/2015, 02/05/2017  . Pneumococcal Conjugate-13 08/17/2017  . Pneumococcal Polysaccharide-23 06/01/2014  . Td 05/05/2013  . Tdap 06/01/2014  . Zoster 05/05/2014    Health Maintenance  Topic Date Due  . PNA vac Low Risk Adult (2 of 2 - PCV13) 06/02/2015  . INFLUENZA VACCINE  12/03/2017  . MAMMOGRAM  12/04/2017  . COLONOSCOPY  04/04/2024  . TETANUS/TDAP  06/01/2024  . DEXA SCAN  Completed    Discussed health benefits of physical activity, and encouraged her to engage in regular exercise appropriate for her age and condition.   No orders of the defined types were placed in this encounter.   Current Outpatient Medications:  .  aspirin EC 325 MG tablet, Take 325 mg by mouth daily., Disp: , Rfl:  .   Coenzyme Q10 (CO Q 10) 100 MG CAPS, Take 100 mg by mouth daily. , Disp: , Rfl:  .  cyanocobalamin 1000 MCG tablet, Take 3,000 mcg by mouth 2 (two) times daily. , Disp: , Rfl:  .  FIBER ADULT GUMMIES PO, Take 5 g by mouth 2 (two) times daily., Disp: , Rfl:  .  fluticasone (FLONASE) 50 MCG/ACT nasal spray, Place 1 spray into both nostrils daily as needed for allergies., Disp: , Rfl: 12 .  folic acid (FOLVITE) 400 MCG tablet, Take 400 mcg by mouth daily. , Disp: , Rfl:  .  ketotifen (ZADITOR) 0.025 % ophthalmic solution, Place 1 drop into both eyes daily., Disp: , Rfl:  .  latanoprost (XALATAN) 0.005 % ophthalmic solution, Place 1 drop into both eyes at bedtime., Disp: , Rfl:  .  Lutein 20 MG CAPS, Take 40 mg by mouth daily., Disp: , Rfl:  .  Magnesium 250 MG TABS, Take 250 mg by mouth daily. , Disp: , Rfl:  .  Multiple Vitamin (MULTIVITAMIN WITH MINERALS) TABS tablet, Take 1 tablet by mouth daily., Disp: ,  Rfl:  .  Polyethyl Glycol-Propyl Glycol (SYSTANE ULTRA OP), Place 1 drop into both eyes 2 (two) times daily as needed (for dry eyes). , Disp: , Rfl:  .  ranitidine (ZANTAC) 150 MG tablet, TAKE 1 TABLET BY MOUTH TWICE A DAY (Patient taking differently: TAKE 1 TABLET BY PRN), Disp: 60 tablet, Rfl: 0 .  Red Yeast Rice 600 MG TABS, Take 600 mg by mouth 2 (two) times daily. , Disp: , Rfl:  .  triamcinolone cream (KENALOG) 0.1 %, Apply 1 application topically 2 (two) times daily. Mix one to one with gentle moisturizing lotion; as needed, Disp: 45 g, Rfl: 1 .  Melatonin 3 MG CAPS, Take 3 mg by mouth at bedtime., Disp: , Rfl:  There are no discontinued medications.  Next Medicare Wellness Visit in 12+ months  Problem List Items Addressed This Visit      Cardiovascular and Mediastinum   Thoracic aortic atherosclerosis (HCC)    Goal LDL less than 70      Relevant Orders   Lipid panel   Carotid atherosclerosis, bilateral    Order carotid duplex; patient canceled her [place] earlier this year; patient  agrees to check      Relevant Orders   US Carotid Bilateral   Lipid panel     Other   Postmenopausal    Screening DEXA      Relevant Orders   DG Bone Density   Medication monitoring encounter    Check labs      Relevant Orders   CBC with Differential/Platelet   COMPLETE METABOLIC PANEL WITH GFR   Medicare annual wellness visit, subsequent - Primary    USPSTF grade A and B recommendations reviewed with patient; age-appropriate recommendations, preventive care, screening tests, etc discussed and encouraged; healthy living encouraged; see AVS for patient education given to patient      Hyperlipidemia (Chronic)    Did not tolerate any statins; will see about PCSK-9 inhibitor      Relevant Orders   Lipid panel   Hx of completed stroke    This should qualify her for PCSK9 inhibitor; didn't tolerate statins      DNAR (do not attempt resuscitation)    Confirmed today      Relevant Orders   DNR (Do Not Resuscitate)    Other Visit Diagnoses    Need for vaccination with 13-polyvalent pneumococcal conjugate vaccine       Hx of abnormal mammogram       Relevant Orders   MM Digital Diagnostic Bilat

## 2017-08-17 NOTE — Assessment & Plan Note (Signed)
Did not tolerate any statins; will see about PCSK-9 inhibitor

## 2017-08-17 NOTE — Assessment & Plan Note (Signed)
This should qualify her for PCSK9 inhibitor; didn't tolerate statins

## 2017-08-17 NOTE — Assessment & Plan Note (Addendum)
Order carotid duplex; patient canceled her [place] earlier this year; patient agrees to check

## 2017-08-17 NOTE — Assessment & Plan Note (Signed)
Confirmed today.  

## 2017-08-21 ENCOUNTER — Ambulatory Visit
Admission: RE | Admit: 2017-08-21 | Discharge: 2017-08-21 | Disposition: A | Discharge status: Home / Self Care | Payer: Medicare HMO | Source: Ambulatory Visit | Attending: Family Medicine | Admitting: Family Medicine

## 2017-08-21 DIAGNOSIS — I6523 Occlusion and stenosis of bilateral carotid arteries: Secondary | ICD-10-CM

## 2017-10-01 ENCOUNTER — Other Ambulatory Visit: Payer: Self-pay

## 2017-10-01 ENCOUNTER — Telehealth: Payer: Self-pay | Admitting: Family Medicine

## 2017-10-01 DIAGNOSIS — R5383 Other fatigue: Secondary | ICD-10-CM

## 2017-10-01 DIAGNOSIS — L309 Dermatitis, unspecified: Secondary | ICD-10-CM

## 2017-10-01 NOTE — Telephone Encounter (Signed)
Pt.notified

## 2017-10-01 NOTE — Telephone Encounter (Signed)
I'll be very glad to order a TSH, and also a vitamin D level (vit D deficiency can cause both fatigue and dermatitis)

## 2017-10-01 NOTE — Telephone Encounter (Signed)
Patient called wanted to know the last time she had a TSH.  It looks like the last one was in 2016 by a different provider.  But her Dermatologist asked because she has a rash and has had no energy.  Wants to know if you can order so she can get this done?

## 2017-10-01 NOTE — Telephone Encounter (Signed)
Copied from CRM [id number]. Topic: Quick Communication - See Telephone Encounter >> Oct 01, 2017  9:31 AM Rudi Coco, NT wrote: CRM for notification. See Telephone encounter for: 10/01/17.  Pt. Needing to know when is the last time she has had her thyroid checked needing to to speak with nurse [phone]

## 2017-10-02 LAB — TSH: TSH: 1.17 m[IU]/L (ref 0.40–4.50)

## 2017-10-02 LAB — VITAMIN D 25 HYDROXY (VIT D DEFICIENCY, FRACTURES): VIT D 25 HYDROXY: 39 ng/mL (ref 30–100)

## 2017-12-08 ENCOUNTER — Ambulatory Visit
Admission: RE | Admit: 2017-12-08 | Discharge: 2017-12-08 | Disposition: A | Discharge status: Home / Self Care | Payer: Medicare HMO | Source: Ambulatory Visit | Attending: Family Medicine | Admitting: Family Medicine

## 2017-12-08 DIAGNOSIS — Z87898 Personal history of other specified conditions: Secondary | ICD-10-CM

## 2017-12-08 DIAGNOSIS — Z780 Asymptomatic menopausal state: Secondary | ICD-10-CM | POA: Diagnosis present

## 2017-12-08 DIAGNOSIS — R921 Mammographic calcification found on diagnostic imaging of breast: Secondary | ICD-10-CM | POA: Diagnosis not present

## 2017-12-14 IMAGING — MR MR CERVICAL SPINE W/O CM
5 series · 36 of 48 positions shown · non-contrast
Comparison: CT cervical spine 09/04/2016. MRI cervical spine
02/12/2004.

CLINICAL DATA: Intermittent shooting pains left side of the neck
into the head for 2 years. No known injury.

EXAM:
MRI CERVICAL SPINE WITHOUT CONTRAST
TECHNIQUE: Multiplanar, multisequence MR imaging of the cervical spine was
performed. No intravenous contrast was administered.

[Series 3: T2 · sagittal · 3.0mm · 0.70mm/px · 8 of 15 slices shown (1 of 2)]
[im 1/15]
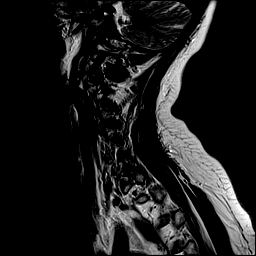
[im 3/15]
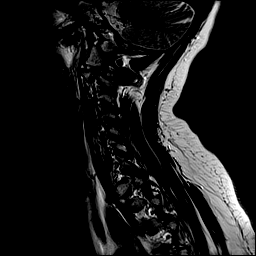
[im 5/15]
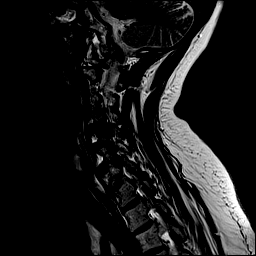
[im 7/15]
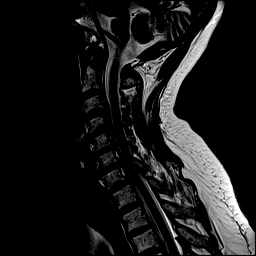
[im 9/15]
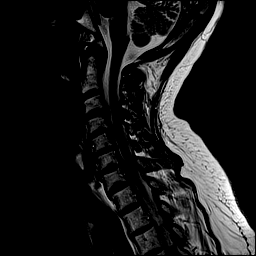
[im 11/15]
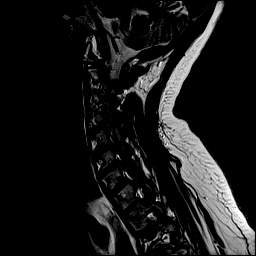
[im 13/15]
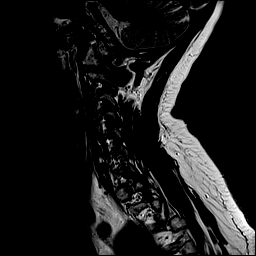
[im 15/15]
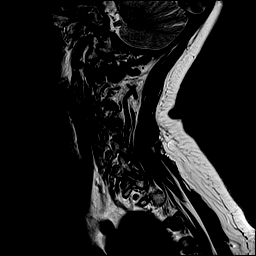

[Series 4: T1 · sagittal · 3.0mm · 0.70mm/px · 8 of 15 slices shown]
[im 1/15]
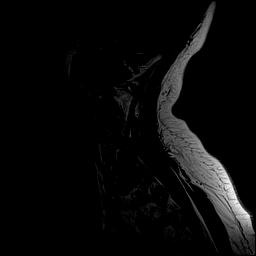
[im 3/15]
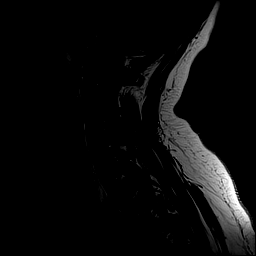
[im 5/15]
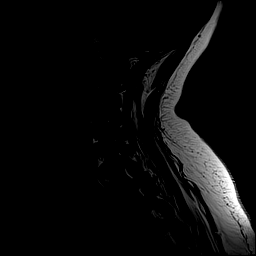
[im 7/15]
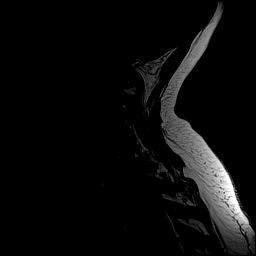
[im 9/15]
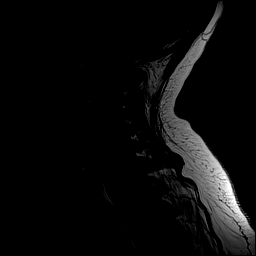
[im 11/15]
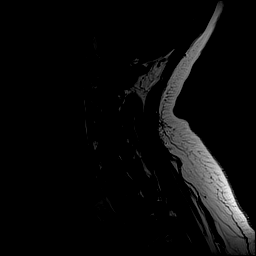
[im 13/15]
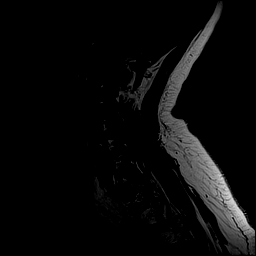
[im 15/15]
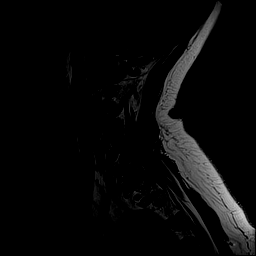

[Series 5: STIR · sagittal · 3.0mm · 0.35mm/px · 8 of 15 slices shown]
[im 1/15]
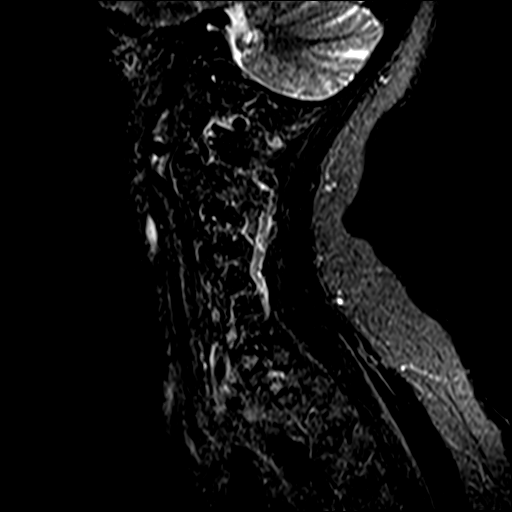
[im 3/15]
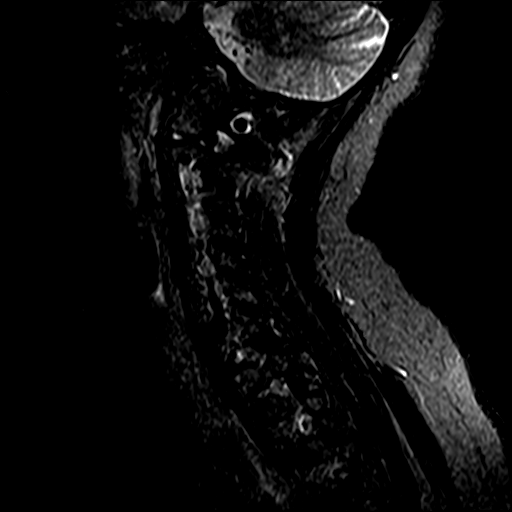
[im 5/15]
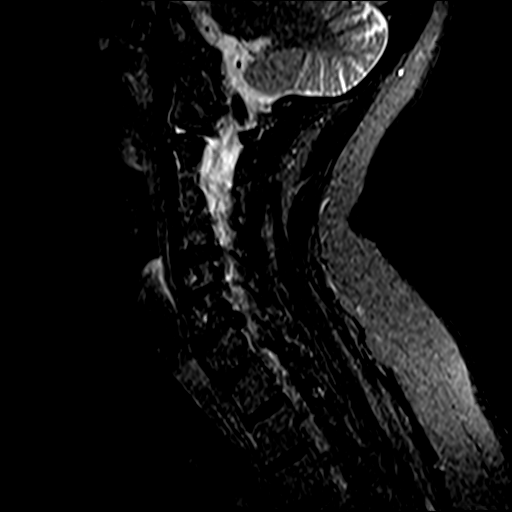
[im 7/15]
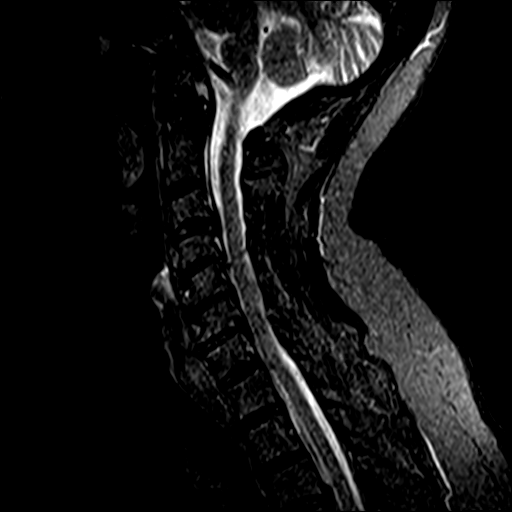
[im 9/15]
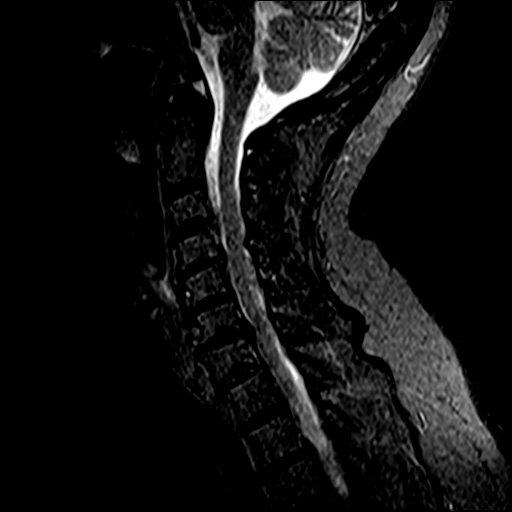
[im 11/15]
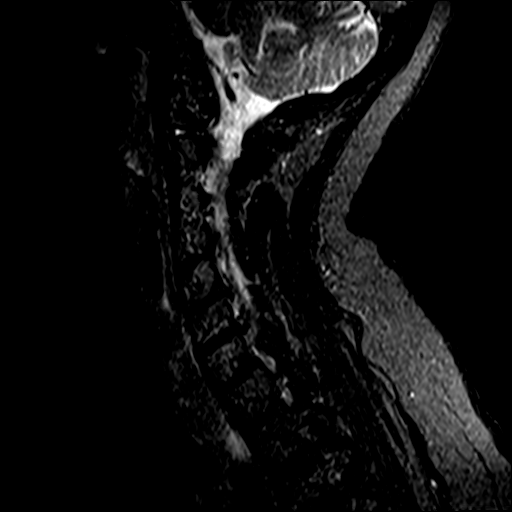
[im 13/15]
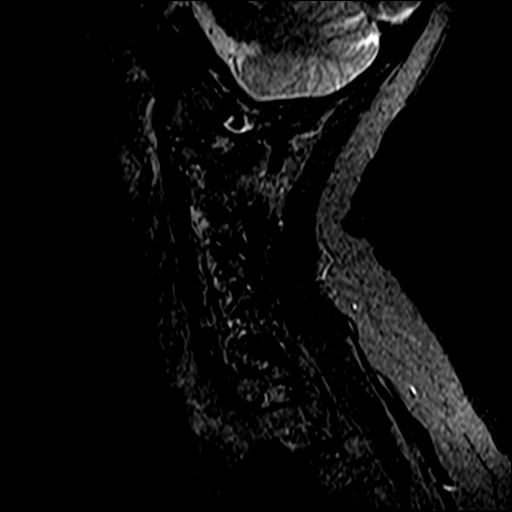
[im 15/15]
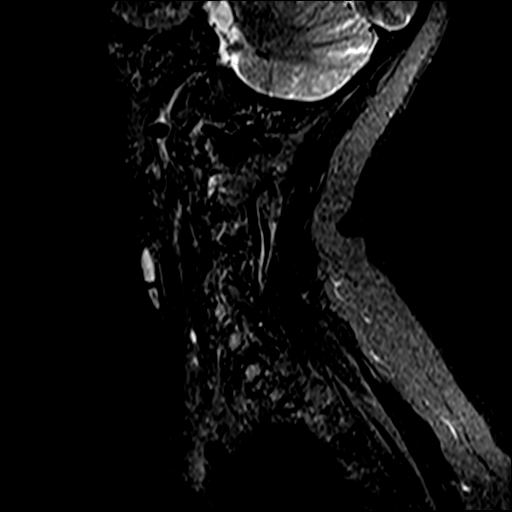

[Series 6: T2 · axial · 3.0mm · 0.70mm/px · z∈[-7,+76]mm · 9 of 23 slices shown (2 of 2)]
[im 1/23]
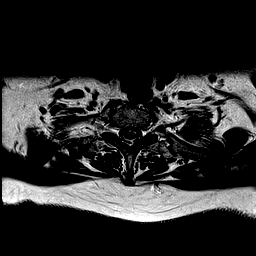
[im 5/23]
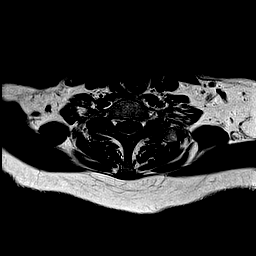
[im 7/23]
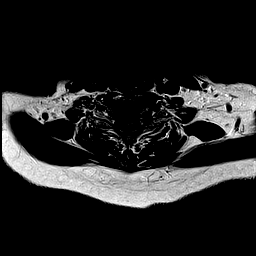
[im 11/23]
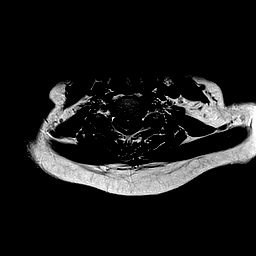
[im 13/23]
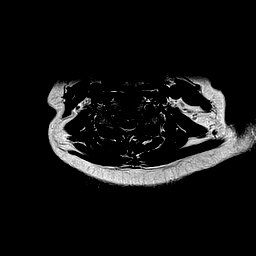
[im 17/23]
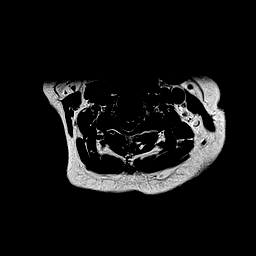
[im 19/23]
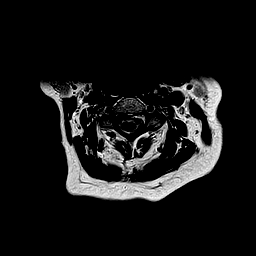
[im 21/23]
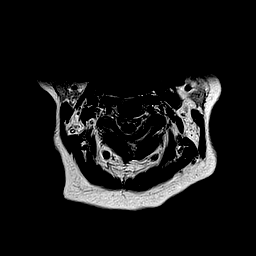
[im 23/23]
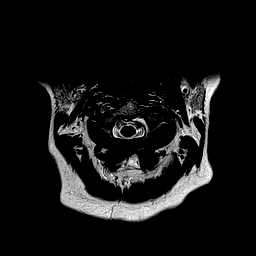

[Series 7: mpgr ax · axial · 3.0mm · 0.35mm/px · z∈[-7,+16]mm · 3 of 23 slices shown]
[im 1/23]
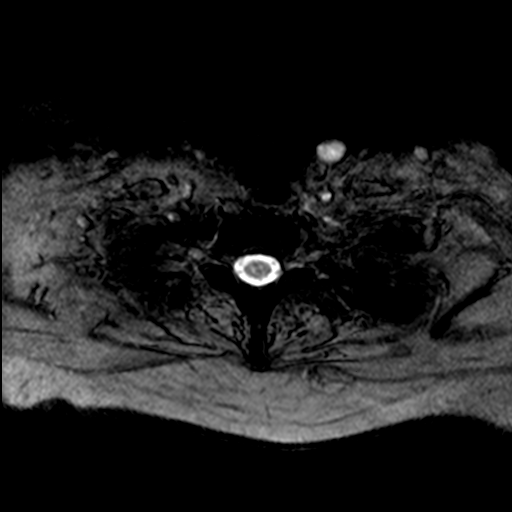
[im 5/23]
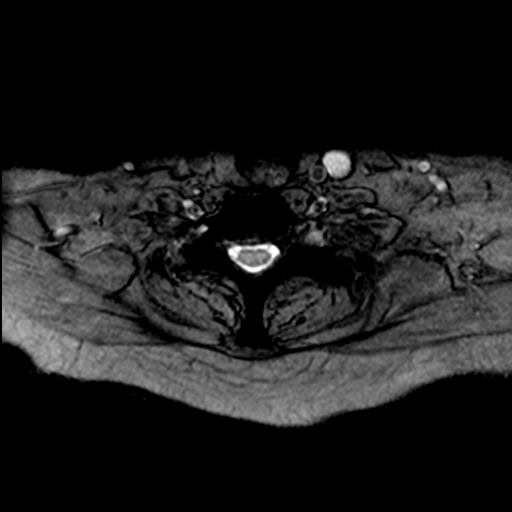
[im 7/23]
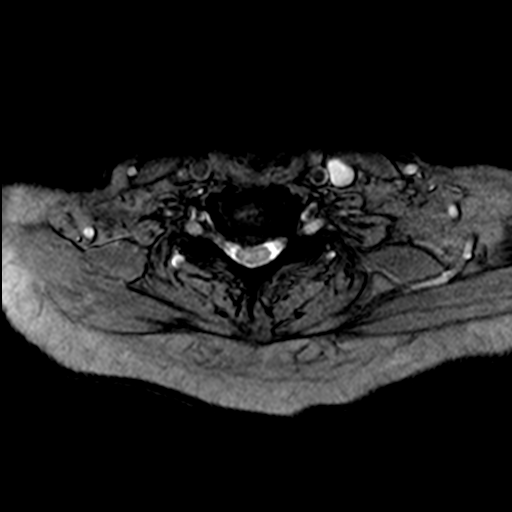

[36 of 48 positions shown; findings below may reference images not displayed]

FINDINGS: Alignment: Maintained.

Vertebrae: No fracture or worrisome lesion.

Cord: Normal signal throughout.

Posterior Fossa, vertebral arteries, paraspinal tissues: Negative.

Disc levels:

C2-3:  Negative.

C3-4: There is a partially calcified central disc protrusion
contacting and indenting the ventral cord. The foramina are open.

C4-5: Central disc protrusion and left worse than right
uncovertebral disease are seen. There is mild deformity of the
ventral cord and severe left foraminal narrowing. The right foramen
is open.

C5-6: Small disc osteophyte complex and left worse than right
uncovertebral disease. The ventral thecal sac is effaced. Moderately
severe left foraminal narrowing is identified. The right foramen is
open.

C6-7: Large broad-based right paracentral protrusion with endplate
spurring deforms the ventral cord. Uncovertebral disease on the left
causes severe foraminal narrowing. The right foramen is open.

C7-T1: Minimal disc bulge. Bilateral facet degenerative change is
present. The central canal the foramina appear open.
IMPRESSION: Partially calcified central protrusion at C3-4 indents the ventral
cord.

Mild deformity of the ventral cord and severe left foraminal
narrowing at C4-5 due to a central disc protrusion and uncovertebral
disease.

Moderately severe left foraminal narrowing at C5-6 due to
uncovertebral disease.

Large broad-based right paracentral protrusion and endplate spur at
C6-7 deform the ventral cord. Severe left foraminal narrowing at
this level is due to uncovertebral disease.

## 2017-12-21 ENCOUNTER — Encounter: Payer: Self-pay | Admitting: Family Medicine

## 2017-12-21 DIAGNOSIS — M8580 Other specified disorders of bone density and structure, unspecified site: Secondary | ICD-10-CM | POA: Insufficient documentation

## 2018-04-12 IMAGING — US US PELVIS COMPLETE
1 series · 14 of 25 positions shown · non-contrast
Comparison: CT 04/25/2015

CLINICAL DATA: Postprandial bloating for 2 years

EXAM:
TRANSABDOMINAL AND TRANSVAGINAL ULTRASOUND OF PELVIS
TECHNIQUE: Both transabdominal and transvaginal ultrasound examinations of the
pelvis were performed. Transabdominal technique was performed for
global imaging of the pelvis including uterus, ovaries, adnexal
regions, and pelvic cul-de-sac. It was necessary to proceed with
endovaginal exam following the transabdominal exam to visualize the
endometrium and ovaries.

[Series 1: us pelvis complete · 0.20mm/px · 14 of 88 slices shown]
[im 1/88]
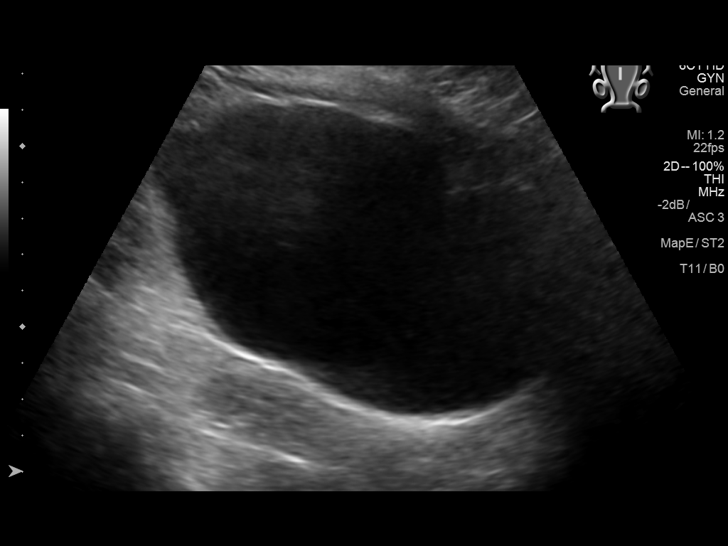
[im 8/88]
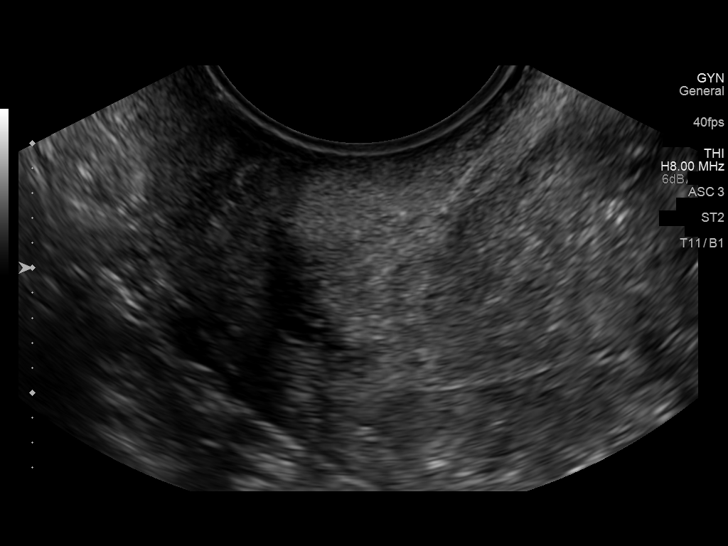
[im 15/88]
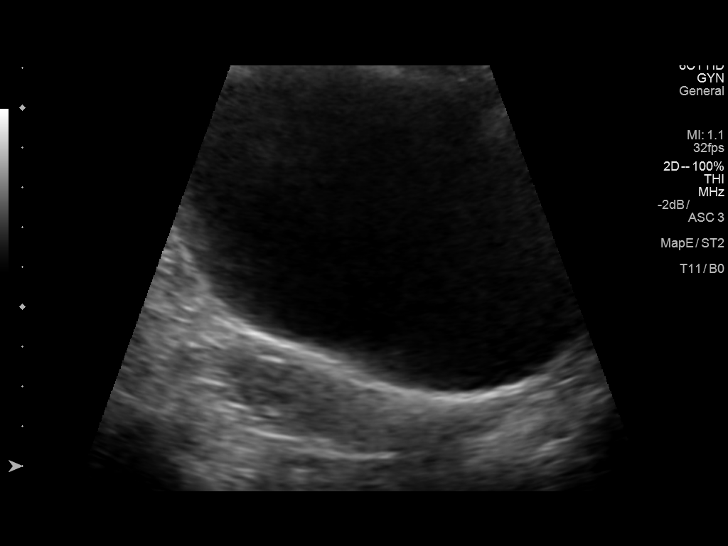
[im 22/88]
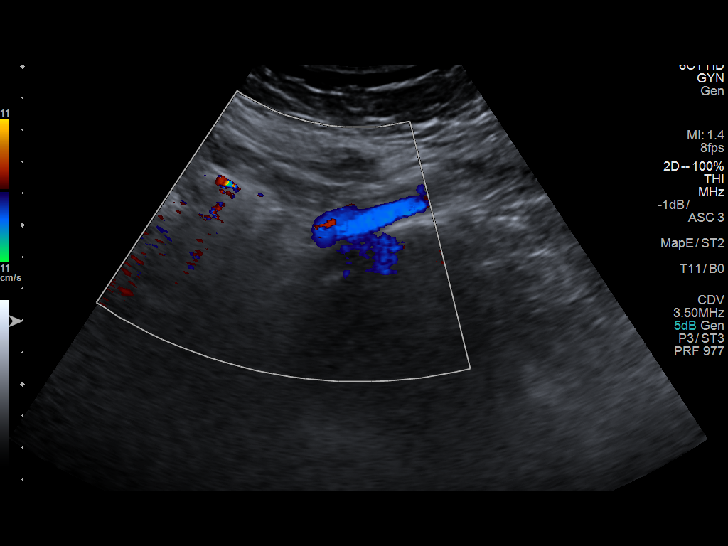
[im 30/88]
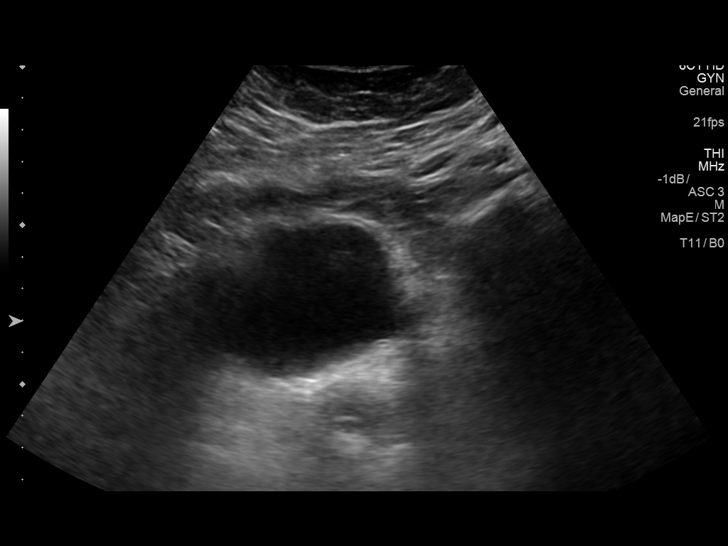
[im 33/88]
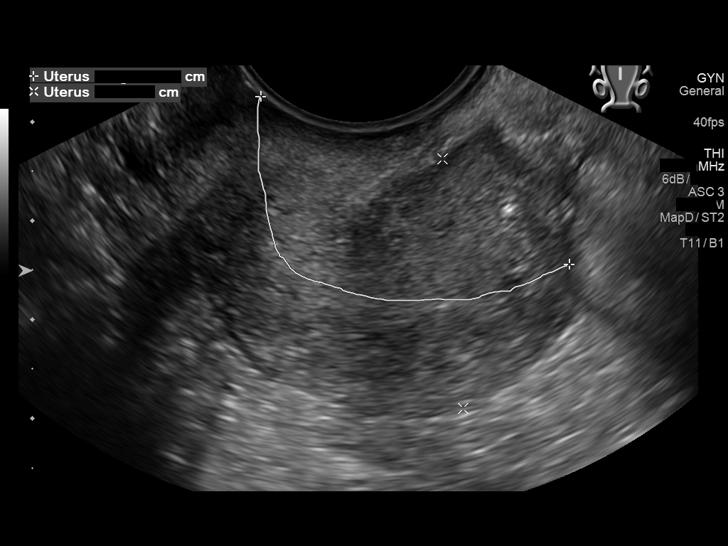
[im 40/88]
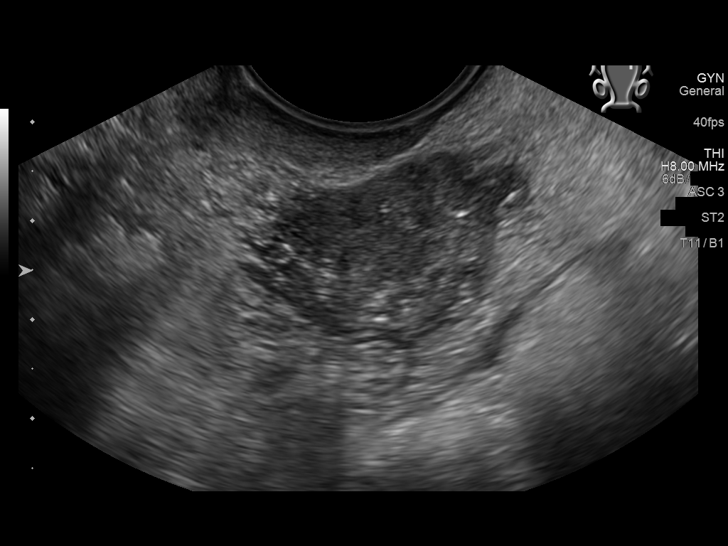
[im 48/88]
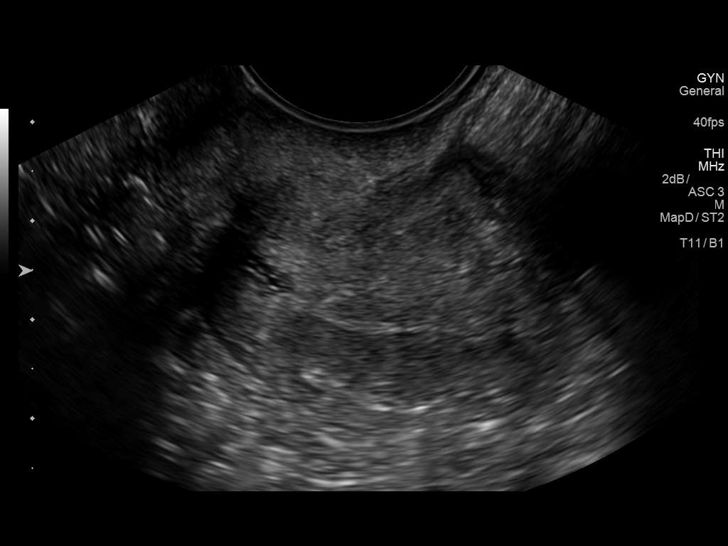
[im 55/88]
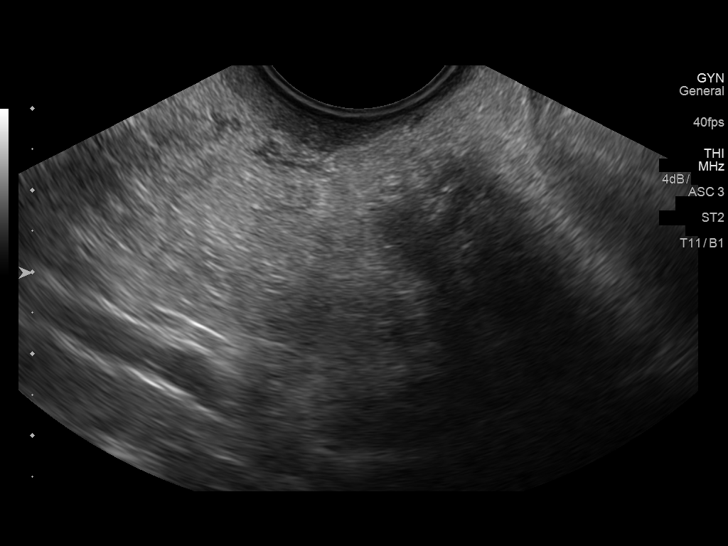
[im 59/88]
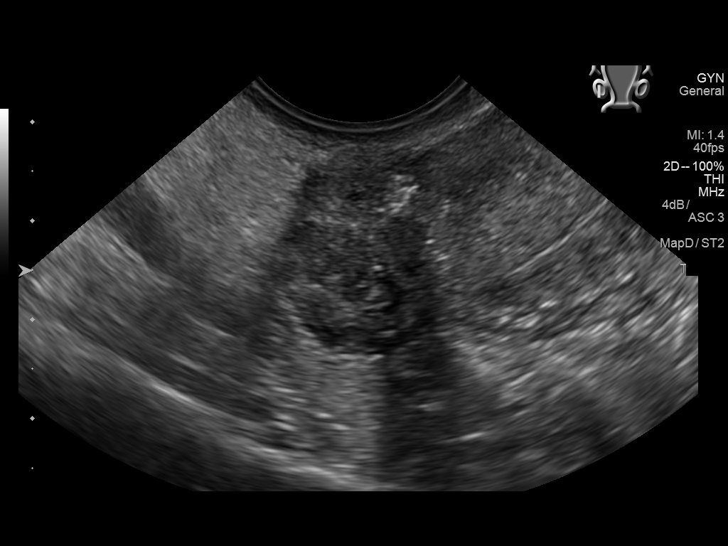
[im 66/88]
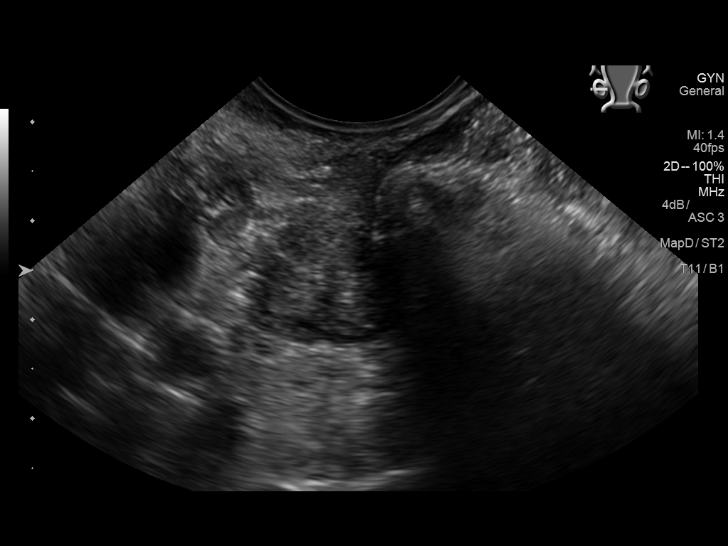
[im 73/88]
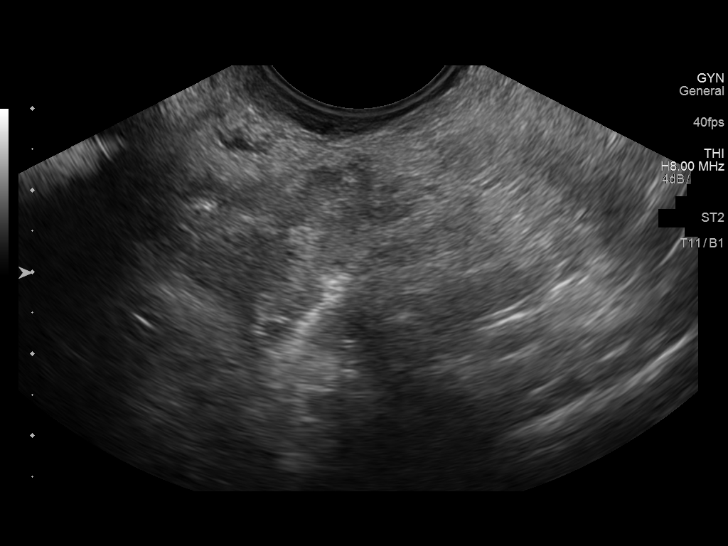
[im 80/88]
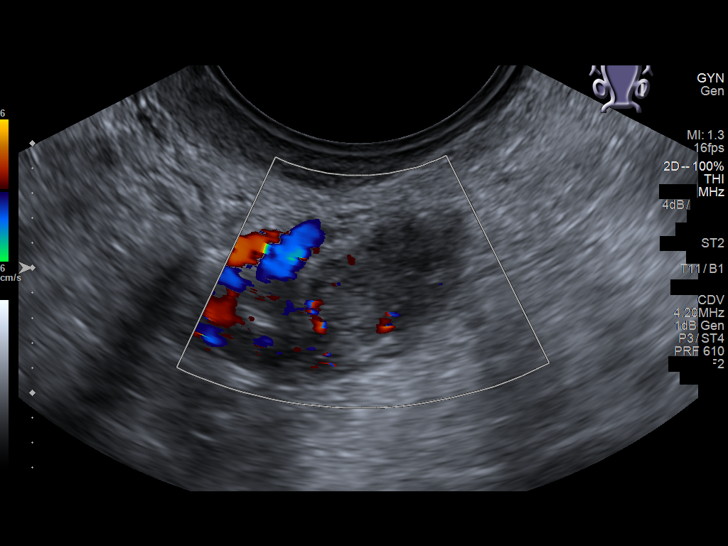
[im 88/88]
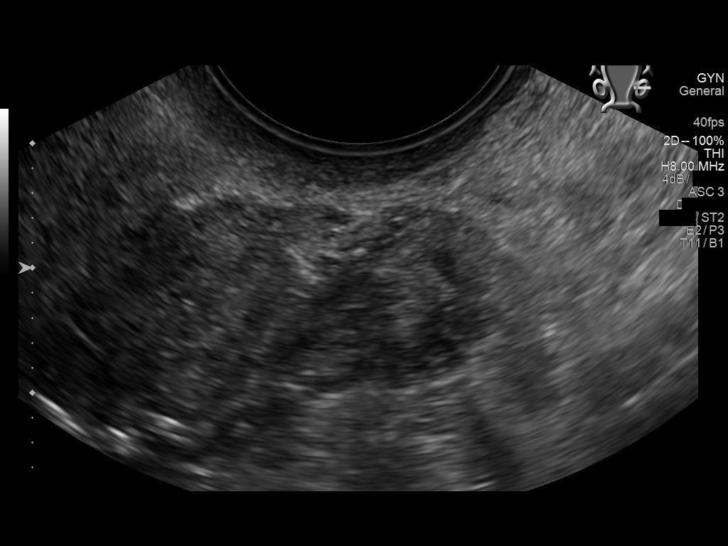

[14 of 25 positions shown; findings below may reference images not displayed]

FINDINGS: Uterus

Measurements: 4.8 x 2.5 x 3.6 cm. No fibroids or other mass
visualized. Few echodense foci in the myometrium suggestive of
calcifications.

Endometrium

Thickness: 2.4 mm.  No focal abnormality visualized.

Right ovary

Measurements: 1.6 x 1.2 x 1.4 cm. Normal appearance/no adnexal mass.

Left ovary

Measurements: 1.9 x 1 x 1.3 cm. Normal appearance/no adnexal mass.

Other findings

No abnormal free fluid.
IMPRESSION: Essentially negative pelvic ultrasound

## 2018-05-05 ENCOUNTER — Encounter: Payer: Self-pay | Admitting: Family Medicine

## 2018-05-11 NOTE — Telephone Encounter (Signed)
Erroneous Encounter

## 2018-05-17 NOTE — Telephone Encounter (Signed)
This encounter was created in error - please disregard.

## 2018-06-02 ENCOUNTER — Ambulatory Visit (INDEPENDENT_AMBULATORY_CARE_PROVIDER_SITE_OTHER): Payer: Medicare HMO | Admitting: Family Medicine

## 2018-06-02 ENCOUNTER — Encounter: Payer: Self-pay | Admitting: Family Medicine

## 2018-06-02 VITALS — BP 122/74 | HR 88 | Temp 98.0°F | Resp 12 | Ht 60.0 in | Wt 136.7 lb

## 2018-06-02 DIAGNOSIS — R000 Tachycardia, unspecified: Secondary | ICD-10-CM

## 2018-06-02 DIAGNOSIS — L659 Nonscarring hair loss, unspecified: Secondary | ICD-10-CM

## 2018-06-02 DIAGNOSIS — M2550 Pain in unspecified joint: Secondary | ICD-10-CM

## 2018-06-02 DIAGNOSIS — G90A Postural orthostatic tachycardia syndrome (POTS): Secondary | ICD-10-CM

## 2018-06-02 DIAGNOSIS — I10 Essential (primary) hypertension: Secondary | ICD-10-CM

## 2018-06-02 DIAGNOSIS — I6523 Occlusion and stenosis of bilateral carotid arteries: Secondary | ICD-10-CM

## 2018-06-02 DIAGNOSIS — R2689 Other abnormalities of gait and mobility: Secondary | ICD-10-CM | POA: Diagnosis not present

## 2018-06-02 DIAGNOSIS — I951 Orthostatic hypotension: Secondary | ICD-10-CM

## 2018-06-02 DIAGNOSIS — W19XXXA Unspecified fall, initial encounter: Secondary | ICD-10-CM | POA: Diagnosis not present

## 2018-06-02 DIAGNOSIS — I498 Other specified cardiac arrhythmias: Secondary | ICD-10-CM | POA: Insufficient documentation

## 2018-06-02 NOTE — Patient Instructions (Signed)
Please do stay well-hydrated We'll have you see a neurologist Please have labs done at [facility] good fall precautions

## 2018-06-02 NOTE — Assessment & Plan Note (Signed)
No medications, try to follow DASH guidelines

## 2018-06-02 NOTE — Assessment & Plan Note (Signed)
Seen by vascular doctor; urged her to contact Dr. Wyn Quaker about next imaging and follow-up

## 2018-06-02 NOTE — Progress Notes (Signed)
BP 122/74   Pulse 88   Temp 98 F (36.7 C) (Oral)   Resp 12   Ht 5' (1.524 m)   Wt 136 lb 11.2 oz (62 kg)   SpO2 95%   BMI 26.70 kg/m    Subjective:    Patient ID: Yvonne Espinoza A Koziol, female    DOB: 06/27/1940, 78 y.o.   MRN: [id number]  HPI: Yvonne Espinoza A Hinnenkamp is a 78 y.o. female  Chief Complaint  Patient presents with  . balance    losing balance and falls  . Alopecia    HPI Patient is here for an acute visit  Hair Loss Patient endorses increase hair loss and thinning was ongoing for about 2-3 months. States tried OTC biotin and Mane and Tail hair shampoo this past month and has been improving. States she also notes dry skin as well. States she also feels she doesn't have much energy  Off-Balanced About a month or so ago noticed she was feeling off-balances intermittently. States last time she was leaning to get the garbage can 2 weeks ago and kept falling forward and landed on her right hand and left hand. Right hand was swollen and bruised for a few days and then resolved. Had some right hip and right shoulder pain. States right shoulder is still sore. She does not think she broke everything. Everything moved okay States feels off-balances at least a few times a week, becoming more frequent to almost daily. Just happens for a couple of minutes or seconds. Denies room spinning of feeling she is spinning. States normally doesn't have a problem when she is walking just when she first gets up or leaning towards something. She has fallen twice in the last month, but has almost fallen and caught herself multiple times. Has had vertigo in the past and this does not feel similar.  Notices decreased flexibility with joints, has to hold onto stuff to help her get up. Walks a quarter of a mile to a mile a day.  Feels like joints are tightening up; poor flexibility; tighteness going on for a year or so The falling just started in the last two months or so Fell backwards on her head, but  didn't bother her; more than a month ago No nausea Having vision changes; had her cataracts done; they gave her drops of prednisone, couldn't take that though in the left eye and had problems; finally started clearing up; got the right eye done, different doctor; going to [facility]V at [facility]; dry eyes; she has a cloud over the right eye, they are aware of that; no double vision, no amaurosis fugax; some vision distortion No leg weakness, just stiffness in the joints She had a carotid US Saw neurologist for very fleeting headaches; had brain MRI Sept 2018 Carotid atherosclerosis, managed by Dr. 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; had to have her "throat stretched" probably 5-6 months ago; gas bubbles under ribs; went to see many doctors for multiple tests for abdominal bloating;   [facility] 06/02/2018 08/17/2017 11/20/2016 10/15/2016 08/21/2016  Decreased Interest 0 0 0 0 0  Down, Depressed, Hopeless 0 0 0 0 0  PHQ - 2 Score 0 0 0 0 0  Altered sleeping 0 - - - -  Tired, decreased energy 0 - - - -  Change in appetite  0 - - - -  Feeling bad or failure about yourself  0 - - - -  Trouble concentrating 0 - - - -  Moving slowly or fidgety/restless 0 - - - -  Suicidal thoughts 0 - - - -  PHQ-9 Score 0 - - - -  Difficult doing work/chores Not difficult at all - - - -   Fall Risk  06/02/2018 08/17/2017 11/20/2016 10/15/2016 08/21/2016  Falls in the past year? 1 No No No No  Number falls in past yr: 1 - - - -  Injury with Fall? 0 - - - -   Relevant past medical, surgical, family and social history reviewed Past Medical History:  Diagnosis Date  . Abscess    colon  . Arthritis    RA, degenerative disc disease  . Collagen vascular disease (HCC)    Rhematoid Arthritis  . Colovesical fistula    2016  . Degenerative cervical spinal stenosis 09/08/2016    . Diverticulitis   . DNAR (do not attempt resuscitation) 08/14/2016   Discussed in person August 14, 2016  . Dysphagia   . GERD (gastroesophageal reflux disease)   . History of hiatal hernia   . Hyperlipidemia   . Hypertension   . Kidney cysts   . Lung nodule < 6cm on CT 09/08/2016  . Radiculopathy   . Reflux esophagitis   . SBO (small bowel obstruction) (HCC) 04/25/2015   partial   . Schatzki's ring 2018   has been stretched several times  . Spinal stenosis   . Stroke [facility]) 2019   TIA 2009, told dr. dew that she has had them as recent as last year  . Thoracic aortic aneurysm (HCC)   . Thoracic aortic atherosclerosis (HCC) 09/08/2016  . TIA (transient ischemic attack)    2209,2010  . 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 Wears glasses    Past Surgical History:  Procedure Laterality Date  . CARDIOVASCULAR STRESS TEST  08/08/11 and 06/07/10  . CATARACT EXTRACTION W/PHACO Left 07/30/2017   Procedure: CATARACT EXTRACTION PHACO AND INTRAOCULAR LENS PLACEMENT (IOC);  Surgeon: Nevada Crane, MD;  Location: [facility] ORS;  Service: Ophthalmology;  Laterality: Left;  [place] 00:21.4 AP% 12.1 CDE 2.59 Fluid Pack Lot # [id number]  . COLOSTOMY REVERSAL  09/26/2014   dr wyatt  . COLOSTOMY REVISION N/A 06/26/2014   Procedure: SIGMOID COLECTOMY WITH BLADDER REPAIR;  Surgeon: Frederik Schmidt, MD;  Location: [facility] OR;  Service: General;  Laterality: N/A;  . CYSTOSCOPY W/ URETERAL STENT PLACEMENT Left 06/26/2014   Procedure: BOARI FLAP LEFT URETERAL REIMPLANT;  Surgeon: Chelsea Aus, MD;  Location: [facility] OR;  Service: Urology;  Laterality: Left;  . DILATION AND CURETTAGE OF UTERUS    . ESOPHAGEAL DILATION  10/10/08  . esophageal stretch  2018   ring stretched  . ESOPHAGOGASTRODUODENOSCOPY (EGD) WITH PROPOFOL N/A 11/04/2016   Procedure: ESOPHAGOGASTRODUODENOSCOPY (EGD) WITH PROPOFOL WITH DILATION;  Surgeon: [name], MD;  Location: [facility] ENDOSCOPY;  Service: Endoscopy;  Laterality: N/A;  . FLEXIBLE  SIGMOIDOSCOPY N/A 09/26/2014   Procedure: RIGID SIGMOIDOSCOPY;  Surgeon: Jimmye Norman, MD;  Location: [facility] OR;  Service: General;  Laterality: N/A;  . ILEOSTOMY N/A 06/26/2014   Procedure: LOOP ILEOSTOMY;  Surgeon: Frederik Schmidt, MD;  Location: [facility] OR;  Service: General;  Laterality: N/A;  . INSERTION OF ANTERIOR SEGMENT AQUEOUS DRAINAGE DEVICE (ISTENT) Left 07/30/2017   Procedure: INSERTION OF ANTERIOR SEGMENT AQUEOUS DRAINAGE DEVICE (ISTENT);  Surgeon: Nevada Crane, MD;  Location: [facility]  ORS;  Service: Ophthalmology;  Laterality: Left;  . KNEE ARTHROSCOPY Bilateral   . SHOULDER SURGERY     right 2010  . TRANSVERSE LOOP COLOSTOMY N/A 09/26/2014   Procedure: LOOP COLOSTOMY REVERESAL;  Surgeon: Jimmye Norman, MD;  Location: [facility] OR;  Service: General;  Laterality: N/A;   Family History  Problem Relation Age of Onset  . Diabetes Mother        pre dm  . Diabetes Father   . Heart attack Father   . Heart disease Father   . Diabetes Sister   . Stroke Sister   . Glaucoma Brother   . Diabetes Brother   . Diabetes Sister   . Hypertension Son   . Hyperlipidemia Son   . Diabetes Son   . Stroke Maternal Aunt   . Diabetes Maternal Grandfather   . Cancer Neg Hx   . COPD Neg Hx   . Breast cancer Neg Hx    Social History   Tobacco Use  . Smoking status: Never Smoker  . Smokeless tobacco: Never Used  Substance Use Topics  . Alcohol use: Yes    Comment: "rarely"  . Drug use: No     Office Visit from 06/02/2018 in [facility]  AUDIT-C Score  0      Interim medical history since last visit reviewed. Allergies and medications reviewed  Review of Systems Per HPI unless specifically indicated above     Objective:    BP 122/74   Pulse 88   Temp 98 F (36.7 C) (Oral)   Resp 12   Ht 5' (1.524 m)   Wt 136 lb 11.2 oz (62 kg)   SpO2 95%   BMI 26.70 kg/m   Wt Readings from Last 3 Encounters:  06/02/18 136 lb 11.2 oz (62 kg)  08/17/17 137 lb 6.4 oz (62.3 kg)  07/30/17  130 lb (59 kg)   Orthostatic vitals:  Laying: p 65, BP 138/80 Sitting: p 96, BP 150/76 Standing: p 85, BP 142/90  Physical Exam Constitutional:      General: She is not in acute distress.    Appearance: Normal appearance. She is well-developed. She is not diaphoretic.     Comments: Weight stable  HENT:     Head: Normocephalic and atraumatic.     Comments: Hair has diffuse thinning but     Mouth/Throat:     Mouth: Mucous membranes are moist.     Pharynx: No posterior oropharyngeal erythema.  Eyes:     General: No visual field deficit or scleral icterus.    Extraocular Movements: Extraocular movements intact.     Pupils: Pupils are equal, round, and reactive to light.  Neck:     Thyroid: No thyromegaly.  Cardiovascular:     Rate and Rhythm: Normal rate and regular rhythm.     Heart sounds: Normal heart sounds. No murmur.  Pulmonary:     Effort: Pulmonary effort is normal. No respiratory distress.     Breath sounds: Normal breath sounds. No wheezing.  Abdominal:     General: Bowel sounds are normal. There is no distension.     Palpations: Abdomen is soft.  Skin:    General: Skin is warm and dry.     Coloration: Skin is not pale.  Neurological:     Mental Status: She is alert.     Cranial Nerves: Cranial nerves are intact. No cranial nerve deficit.     Motor: No tremor or pronator drift.  Coordination: Romberg sign negative. Finger-Nose-Finger Test normal. Rapid alternating movements normal.  Psychiatric:        Behavior: Behavior normal.        Thought Content: Thought content normal.        Judgment: Judgment normal.    Results for orders placed or performed in visit on 10/01/17  VITAMIN D 25 Hydroxy (Vit-D Deficiency, Fractures)  Result Value Ref Range   Vit D, 25-Hydroxy 39 30 - 100 ng/mL  TSH  Result Value Ref Range   TSH 1.17 0.40 - 4.50 mIU/L      Assessment & Plan:   Problem List Items Addressed This Visit      Cardiovascular and Mediastinum    Postural orthostatic tachycardia syndrome    I am NOT diagnosing her with POTS; she does appear to have postural tachycardia though and I will notify her cardiologist, Dr. [facility]wen [facility]alski at [facility]; stay well-hydrated      Relevant Orders   Ambulatory referral to Cardiology   Essential hypertension, benign (Chronic)    No medications, try to follow DASH guidelines      Carotid atherosclerosis, bilateral    Seen by vascular doctor; urged her to contact Dr. Wyn Quakerew about next imaging and follow-up       Other Visit Diagnoses    Balance problems    -  Primary   discussed ddx, will have her see cardiologist, neurologist; refer to PT; fall precautions; patient declined imaging for now, will see neuro   Relevant Orders   Orthostatic vital signs   Urinalysis w microscopic + reflex cultur   Ambulatory referral to Neurology   CBC   TSH   Urinalysis, Routine w reflex microscopic   Comprehensive metabolic panel   Ambulatory referral to Physical Therapy   Hair loss       check thyroid, ddx includes telogen effluvium, explained   Relevant Orders   TSH   Fall, initial encounter       Relevant Orders   Ambulatory referral to Physical Therapy   Ambulatory referral to Cardiology   Arthralgia, unspecified joint       check labs; stiffness may be playing a part in the falls   Relevant Orders   ANA,IFA RA Diag Pnl w/rflx Tit/Patn   C-reactive protein      Follow up plan: Return in about 4 weeks (around 06/30/2018) for follow-up visit with Dr. Sherie DonLada.  An after-visit summary was printed and given to the patient at check-out.  Please see the patient instructions which may contain other information and recommendations beyond what is mentioned above in the assessment and plan.  No orders of the defined types were placed in this encounter.   Orders Placed This Encounter  Procedures  . Urinalysis w microscopic + reflex cultur  . ANA,IFA RA Diag Pnl w/rflx Tit/Patn  . CBC  . C-reactive protein  .  TSH  . Urinalysis, Routine w reflex microscopic  . Comprehensive metabolic panel  . Ambulatory referral to Neurology  . Ambulatory referral to Physical Therapy  . Ambulatory referral to Cardiology  . Orthostatic vital signs

## 2018-06-02 NOTE — Assessment & Plan Note (Signed)
I am NOT diagnosing her with POTS; she does appear to have postural tachycardia though and I will notify her cardiologist, Dr. Gwen Pounds at [facility]; stay well-hydrated

## 2018-06-03 ENCOUNTER — Other Ambulatory Visit: Payer: Self-pay | Admitting: Family Medicine

## 2018-06-03 DIAGNOSIS — R718 Other abnormality of red blood cells: Secondary | ICD-10-CM

## 2018-06-03 LAB — URINALYSIS, ROUTINE W REFLEX MICROSCOPIC
Bilirubin, UA: NEGATIVE
GLUCOSE, UA: NEGATIVE
Ketones, UA: NEGATIVE
LEUKOCYTES UA: NEGATIVE
Nitrite, UA: NEGATIVE
PH UA: 6 (ref 5.0–7.5)
PROTEIN UA: NEGATIVE
RBC, UA: NEGATIVE
Specific Gravity, UA: 1.008 (ref 1.005–1.030)
UUROB: 0.2 mg/dL (ref 0.2–1.0)

## 2018-06-03 LAB — COMPREHENSIVE METABOLIC PANEL
ALBUMIN: 4.5 g/dL (ref 3.7–4.7)
ALK PHOS: 95 IU/L (ref 39–117)
ALT: 21 IU/L (ref 0–32)
AST: 22 IU/L (ref 0–40)
Albumin/Globulin Ratio: 1.7 (ref 1.2–2.2)
BILIRUBIN TOTAL: 0.3 mg/dL (ref 0.0–1.2)
BUN / CREAT RATIO: 16 (ref 12–28)
BUN: 14 mg/dL (ref 8–27)
CHLORIDE: 100 mmol/L (ref 96–106)
CO2: 25 mmol/L (ref 20–29)
Calcium: 10.4 mg/dL — ABNORMAL HIGH (ref 8.7–10.3)
Creatinine, Ser: 0.9 mg/dL (ref 0.57–1.00)
GFR calc non Af Amer: 62 mL/min/{1.73_m2} (ref >59)
GFR, EST AFRICAN AMERICAN: 72 mL/min/{1.73_m2} (ref >59)
GLOBULIN, TOTAL: 2.6 g/dL (ref 1.5–4.5)
GLUCOSE: 95 mg/dL (ref 65–99)
Potassium: 4.6 mmol/L (ref 3.5–5.2)
Sodium: 144 mmol/L (ref 134–144)
Total Protein: 7.1 g/dL (ref 6.0–8.5)

## 2018-06-03 LAB — CBC
HEMOGLOBIN: 15.5 g/dL (ref 11.1–15.9)
Hematocrit: 47 % — ABNORMAL HIGH (ref 34.0–46.6)
MCH: 30 pg (ref 26.6–33.0)
MCHC: 33 g/dL (ref 31.5–35.7)
MCV: 91 fL (ref 79–97)
PLATELETS: 375 10*3/uL (ref 150–450)
RBC: 5.17 x10E6/uL (ref 3.77–5.28)
RDW: 13.8 % (ref 11.7–15.4)
WBC: 7.7 10*3/uL (ref 3.4–10.8)

## 2018-06-03 LAB — C-REACTIVE PROTEIN: CRP: 4 mg/L (ref 0–10)

## 2018-06-03 LAB — TSH: TSH: 1.43 u[IU]/mL (ref 0.450–4.500)

## 2018-06-03 NOTE — Progress Notes (Signed)
Recheck Ca2+ and Hct in 3-4 weeks

## 2018-06-04 ENCOUNTER — Encounter: Payer: Self-pay | Admitting: Family Medicine

## 2018-06-04 DIAGNOSIS — R768 Other specified abnormal immunological findings in serum: Secondary | ICD-10-CM | POA: Insufficient documentation

## 2018-06-04 DIAGNOSIS — R7989 Other specified abnormal findings of blood chemistry: Secondary | ICD-10-CM | POA: Insufficient documentation

## 2018-06-04 LAB — ANA,IFA RA DIAG PNL W/RFLX TIT/PATN
ANA TITER 1: NEGATIVE
Cyclic Citrullin Peptide Ab: 29 units — ABNORMAL HIGH (ref 0–19)
Rhuematoid fact SerPl-aCnc: <10 IU/mL (ref 0.0–13.9)

## 2018-06-07 DIAGNOSIS — R9431 Abnormal electrocardiogram [ECG] [EKG]: Secondary | ICD-10-CM | POA: Insufficient documentation

## 2018-06-09 ENCOUNTER — Encounter: Payer: Self-pay | Admitting: Family Medicine

## 2018-06-14 ENCOUNTER — Other Ambulatory Visit: Payer: Self-pay | Admitting: Neurology

## 2018-06-14 ENCOUNTER — Other Ambulatory Visit (HOSPITAL_COMMUNITY): Payer: Self-pay | Admitting: Neurology

## 2018-06-14 DIAGNOSIS — M5412 Radiculopathy, cervical region: Secondary | ICD-10-CM

## 2018-06-16 ENCOUNTER — Ambulatory Visit: Payer: Medicare HMO | Attending: Family Medicine | Admitting: Physical Therapy

## 2018-06-16 ENCOUNTER — Encounter: Payer: Self-pay | Admitting: Physical Therapy

## 2018-06-16 DIAGNOSIS — R269 Unspecified abnormalities of gait and mobility: Secondary | ICD-10-CM | POA: Diagnosis present

## 2018-06-16 DIAGNOSIS — M6281 Muscle weakness (generalized): Secondary | ICD-10-CM | POA: Insufficient documentation

## 2018-06-16 DIAGNOSIS — R2689 Other abnormalities of gait and mobility: Secondary | ICD-10-CM

## 2018-06-16 NOTE — Patient Instructions (Signed)
Access Code: [email or web address]  URL: [email or web address]  Date: 06/16/2018  Prepared by: Dorene Grebe   Exercises  Beginner Bridge - 15 reps - 2 sets - 1x daily - 7x weekly  Tandem Stance with Support - 3 reps - 1 sets - 30 hold - 1x daily - 7x weekly  Standing Single Leg Stance with Counter Support - 3 reps - 1 sets - 30 hold - 1x daily - 7x weekly  Standing March with Unilateral Counter Support - 15 reps - 2 sets - 1x daily - 7x weekly

## 2018-06-16 NOTE — Therapy (Signed)
[facility] [address] [place], [place], [address] Phone: [phone]   Fax:  [phone]  Physical Therapy Evaluation  Patient Details  Name: Yvonne Espinoza MRN: [id number] Date of Birth: 07/21/1940 Referring Provider (PT): Dr. Baruch GoutyMelinda [name]   Encounter Date: 06/16/2018  PT End of Session - 06/16/18 1558    Visit Number  1    Number of Visits  5    Date for PT Re-Evaluation  07/14/18    PT Start Time  0949    PT Stop Time  1041    PT Time Calculation (min)  52 min    Equipment Utilized During Treatment  Gait belt    Activity Tolerance  Patient tolerated treatment well    Behavior During Therapy  [facility] for tasks assessed/performed       Past Medical History:  Diagnosis Date  . Abscess    colon  . Arthritis    RA, degenerative disc disease  . Collagen vascular disease (HCC)    Rhematoid Arthritis  . Colovesical fistula    2016  . Degenerative cervical spinal stenosis 09/08/2016  . Diverticulitis   . DNAR (do not attempt resuscitation) 08/14/2016   Discussed in person August 14, 2016  . Dysphagia   . GERD (gastroesophageal reflux disease)   . History of hiatal hernia   . Hyperlipidemia   . Hypertension   . Kidney cysts   . Lung nodule < 6cm on CT 09/08/2016  . Radiculopathy   . Reflux esophagitis   . SBO (small bowel obstruction) (HCC) 04/25/2015   partial   . Schatzki's ring 2018   has been stretched several times  . Spinal stenosis   . Stroke [facility](HCC) 2019   TIA 2009, told dr. dew that she has had them as recent as last year  . Thoracic aortic aneurysm (HCC)   . Thoracic aortic atherosclerosis (HCC) 09/08/2016  . TIA (transient ischemic attack)    2209,2010  . 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 Wears glasses     Past Surgical History:  Procedure Laterality Date  . CARDIOVASCULAR STRESS TEST  08/08/11 and 06/07/10  . CATARACT EXTRACTION W/PHACO Left 07/30/2017   Procedure: CATARACT EXTRACTION PHACO AND  INTRAOCULAR LENS PLACEMENT (IOC);  Surgeon: Nevada CraneKing, Bradley Mark, MD;  Location: [facility] ORS;  Service: Ophthalmology;  Laterality: Left;  US 00:21.4 AP% 12.1 CDE 2.59 Fluid Pack Lot # [id number]  . COLOSTOMY REVERSAL  09/26/2014   dr wyatt  . COLOSTOMY REVISION N/A 06/26/2014   Procedure: SIGMOID COLECTOMY WITH BLADDER REPAIR;  Surgeon: Frederik SchmidtJay Wyatt, MD;  [facility];  Service: General;  Laterality: N/A;  . CYSTOSCOPY W/ URETERAL STENT PLACEMENT Left 06/26/2014   Procedure: BOARI FLAP LEFT URETERAL REIMPLANT;  Surgeon: Chelsea AusStephen M Dahlstedt, MD;  [facility];  Service: Urology;  Laterality: Left;  . DILATION AND CURETTAGE OF UTERUS    . ESOPHAGEAL DILATION  10/10/08  . esophageal stretch  2018   ring stretched  . ESOPHAGOGASTRODUODENOSCOPY (EGD) WITH PROPOFOL N/A 11/04/2016   Procedure: ESOPHAGOGASTRODUODENOSCOPY (EGD) WITH PROPOFOL WITH DILATION;  Surgeon: Wyline MoodAnna, Kiran, MD;  [facility];  Service: Endoscopy;  Laterality: N/A;  . FLEXIBLE SIGMOIDOSCOPY N/A 09/26/2014   Procedure: RIGID SIGMOIDOSCOPY;  Surgeon: Jimmye NormanJames Wyatt, MD;  [facility];  Service: General;  Laterality: N/A;  . ILEOSTOMY N/A 06/26/2014   Procedure: LOOP ILEOSTOMY;  Surgeon: Frederik SchmidtJay Wyatt, MD;  [facility];  Service: General;  Laterality: N/A;  . INSERTION  OF ANTERIOR SEGMENT AQUEOUS DRAINAGE DEVICE (ISTENT) Left 07/30/2017   Procedure: INSERTION OF ANTERIOR SEGMENT AQUEOUS DRAINAGE DEVICE (ISTENT);  Surgeon: Nevada CraneKing, Bradley Mark, MD;  Location: [facility] ORS;  Service: Ophthalmology;  Laterality: Left;  . KNEE ARTHROSCOPY Bilateral   . SHOULDER SURGERY     right 2010  . TRANSVERSE LOOP COLOSTOMY N/A 09/26/2014   Procedure: LOOP COLOSTOMY REVERESAL;  Surgeon: Jimmye NormanJames Wyatt, MD;  [facility] OR;  Service: General;  Laterality: N/A;    There were no vitals filed for this visit.  Subjective Assessment - 06/16/18 0948    Subjective  Pt. states she has fallen 3x in the last month. 1st fall: in living room and started to pick  something up and fell backwards and pt. hit head on floor. 2nd fall: stepped off stool and fell backwards and hit head.  3rd time was in driveway and was pulling trash bin in and fell forward. Pt. hit R shoulder and hip, not head. Pt. denies pain at rest and has MRI for neck next week and has pain occasionally in neck. Pt. denies LOB when walking and no dizziness/HAs before falls. Pt states she has a couple of dizzy episodes per month and fatigues easier when doing ADLs than usual. Pt. States she takes a minute to orient herself when sitting up in bed in the morning before standing up. Pt. States she has some numbness down her R leg occasionally and sensation in feet is intact.   Pertinent History Pt. Has had extensive colon surgery (see medical hx) which she attributes her LBP to. Pt. Has had series of falls over the past 2 months. [facility] and has 2# weights at home and will try to exercise with them at night.    How long can you walk comfortably?  pt. walks regularly everyday, no AD.         AROM: All AROM WNL    MMT (R/L): hip flexion 4/5, 4/5 Hip add: 4/5, 4/5 Hip abd: 5/5, 5/5 Knee ext: 5/5, 5/5 Knee flex: 5/5, 5/5 DF: 5/5, 5/5  Balance Assessment: Berg: 55 Tandem balance: > 30 sec SLB: ~10 sec R/L  Functional Test: TUG: 9 sec 5X STS: 12 sec     Treatment:  Therex:  Airex balance: tandem balance, EO, EC and with neck movements Tandem balance and SLB added to HEP Standing marches at counter 2x15 TrA education and bridges 2x10 Reviewed HEP  Patient will benefit from skilled therapeutic intervention in order to improve the following deficits and impairments:  Dizziness, Pain, Postural dysfunction, Improper body mechanics, Decreased coordination, Decreased strength, Decreased endurance, Decreased activity tolerance, Decreased balance, Abnormal gait  Visit Diagnosis: Balance problem  Muscle weakness (generalized)  Gait  difficulty     Problem List Patient Active Problem List   Diagnosis Date Noted  . Cyclic citrullinated peptide (CCP) antibody positive 06/04/2018  . Postural orthostatic tachycardia syndrome 06/02/2018  . Osteopenia 12/21/2017  . Postmenopausal 08/17/2017  . Medicare annual wellness visit, subsequent 08/17/2017  . Cervical spondylosis with radiculopathy 10/15/2016  . Thoracic aortic atherosclerosis (HCC) 09/08/2016  . Lung nodule < 6cm on CT 09/08/2016  . Degenerative cervical spinal stenosis 09/08/2016  . Carotid atherosclerosis, bilateral 08/21/2016  . Neck pain on left side 08/21/2016  . DNAR (do not attempt resuscitation) 08/14/2016  . Proteins serum plasma low 08/14/2016  . Preventative health care 08/14/2016  . Abnormal mammogram of both breasts 03/21/2016  . Breast pain 08/22/2015  . Breast cancer  screening 08/14/2015  . Left-sided chest wall pain 06/28/2015  . Essential hypertension, benign 05/18/2015  . History of hematemesis 04/27/2015  . Partial small bowel obstruction (HCC) 04/25/2015  . Hyperlipidemia 04/22/2015  . Hx of completed stroke 04/22/2015  . Medication monitoring encounter 04/22/2015  . Lower abdominal pain 04/22/2015  . Esophageal reflux 04/22/2015  . Hx-TIA (transient ischemic attack) 04/22/2015  . 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 Hx of diverticulitis of colon 05/01/2014   Cammie Mcgee, PT, DPT # [id number] [place], [place] 06/17/2018, 12:37 PM  [facility] [address] [place], [place], [address] Phone: [phone]   Fax:  [phone]  Name: Yvonne Espinoza MRN: [id number] Date of Birth: 06-05-1940

## 2018-06-23 ENCOUNTER — Ambulatory Visit
Admission: RE | Admit: 2018-06-23 | Discharge: 2018-06-23 | Disposition: A | Discharge status: Home / Self Care | Payer: Medicare HMO | Source: Ambulatory Visit | Attending: Neurology | Admitting: Neurology

## 2018-06-23 DIAGNOSIS — M5412 Radiculopathy, cervical region: Secondary | ICD-10-CM | POA: Insufficient documentation

## 2018-06-24 ENCOUNTER — Encounter: Payer: Self-pay | Admitting: Physical Therapy

## 2018-06-24 ENCOUNTER — Ambulatory Visit: Payer: Medicare HMO | Admitting: Physical Therapy

## 2018-06-24 DIAGNOSIS — R269 Unspecified abnormalities of gait and mobility: Secondary | ICD-10-CM

## 2018-06-24 DIAGNOSIS — M6281 Muscle weakness (generalized): Secondary | ICD-10-CM

## 2018-06-24 DIAGNOSIS — R2689 Other abnormalities of gait and mobility: Secondary | ICD-10-CM | POA: Diagnosis not present

## 2018-06-24 NOTE — Therapy (Signed)
[facility] [address] [place], [place], [address] Phone: [phone]   Fax:  [phone]  Physical Therapy Treatment  Patient Details  Name: Yvonne Espinoza MRN: [id number] Date of Birth: 02/19/1941 Referring Provider (PT): Dr. Baruch GoutyMelinda [name]   Encounter Date: 06/24/2018  PT End of Session - 06/24/18 1038    Visit Number  2    Number of Visits  5    Date for PT Re-Evaluation  07/14/18    PT Start Time  0903    PT Stop Time  1005    PT Time Calculation (min)  62 min    Equipment Utilized During Treatment  Gait belt    Activity Tolerance  Patient tolerated treatment well    Behavior During Therapy  [facility] for tasks assessed/performed       Past Medical History:  Diagnosis Date  . Abscess    colon  . Arthritis    RA, degenerative disc disease  . Collagen vascular disease (HCC)    Rhematoid Arthritis  . Colovesical fistula    2016  . Degenerative cervical spinal stenosis 09/08/2016  . Diverticulitis   . DNAR (do not attempt resuscitation) 08/14/2016   Discussed in person August 14, 2016  . Dysphagia   . GERD (gastroesophageal reflux disease)   . History of hiatal hernia   . Hyperlipidemia   . Hypertension   . Kidney cysts   . Lung nodule < 6cm on CT 09/08/2016  . Radiculopathy   . Reflux esophagitis   . SBO (small bowel obstruction) (HCC) 04/25/2015   partial   . Schatzki's ring 2018   has been stretched several times  . Spinal stenosis   . Stroke [facility](HCC) 2019   TIA 2009, told dr. dew that she has had them as recent as last year  . Thoracic aortic aneurysm (HCC)   . Thoracic aortic atherosclerosis (HCC) 09/08/2016  . TIA (transient ischemic attack)    2209,2010  . 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 Wears glasses     Past Surgical History:  Procedure Laterality Date  . CARDIOVASCULAR STRESS TEST  08/08/11 and 06/07/10  . CATARACT EXTRACTION W/PHACO Left 07/30/2017   Procedure: CATARACT EXTRACTION PHACO AND  INTRAOCULAR LENS PLACEMENT (IOC);  Surgeon: Nevada CraneKing, Bradley Mark, MD;  Location: [facility] ORS;  Service: Ophthalmology;  Laterality: Left;  US 00:21.4 AP% 12.1 CDE 2.59 Fluid Pack Lot # [id number] H  . COLOSTOMY REVERSAL  09/26/2014   dr wyatt  . COLOSTOMY REVISION N/A 06/26/2014   Procedure: SIGMOID COLECTOMY WITH BLADDER REPAIR;  Surgeon: Frederik SchmidtJay Wyatt, MD;  [facility];  Service: General;  Laterality: N/A;  . CYSTOSCOPY W/ URETERAL STENT PLACEMENT Left 06/26/2014   Procedure: BOARI FLAP LEFT URETERAL REIMPLANT;  Surgeon: Chelsea AusStephen M Dahlstedt, MD;  [facility];  Service: Urology;  Laterality: Left;  . DILATION AND CURETTAGE OF UTERUS    . ESOPHAGEAL DILATION  10/10/08  . esophageal stretch  2018   ring stretched  . ESOPHAGOGASTRODUODENOSCOPY (EGD) WITH PROPOFOL N/A 11/04/2016   Procedure: ESOPHAGOGASTRODUODENOSCOPY (EGD) WITH PROPOFOL WITH DILATION;  Surgeon: Wyline MoodAnna, Kiran, MD;  [facility];  Service: Endoscopy;  Laterality: N/A;  . FLEXIBLE SIGMOIDOSCOPY N/A 09/26/2014   Procedure: RIGID SIGMOIDOSCOPY;  Surgeon: Jimmye NormanJames Wyatt, MD;  [facility];  Service: General;  Laterality: N/A;  . ILEOSTOMY N/A 06/26/2014   Procedure: LOOP ILEOSTOMY;  Surgeon: Frederik SchmidtJay Wyatt, MD;  [facility];  Service: General;  Laterality: N/A;  . INSERTION  OF ANTERIOR SEGMENT AQUEOUS DRAINAGE DEVICE (ISTENT) Left 07/30/2017   Procedure: INSERTION OF ANTERIOR SEGMENT AQUEOUS DRAINAGE DEVICE (ISTENT);  Surgeon: Nevada Crane, MD;  Location: [facility] ORS;  Service: Ophthalmology;  Laterality: Left;  . KNEE ARTHROSCOPY Bilateral   . SHOULDER SURGERY     right 2010  . TRANSVERSE LOOP COLOSTOMY N/A 09/26/2014   Procedure: LOOP COLOSTOMY REVERESAL;  Surgeon: Jimmye Norman, MD;  [facility];  Service: General;  Laterality: N/A;    There were no vitals filed for this visit.  Subjective Assessment - 06/24/18 1038    Subjective  Pt. Reported having imaging done with cervical spine earlier in the week due to nerve like pain  along the back of her neck. Pt. Reports no issues with completing her HEP. Pt also reported having no pain today.     Pertinent History  Pt. denies LOB when walking. Pt. used to go to [facility] regularly and has 2# weights at home and will try to exercise with them at night.    How long can you walk comfortably?  pt. walks regularly everyday, no AD.     Patient Stated Goals  Improve LE strength/ prevent falls    Currently in Pain?  No/denies         Therex:   Bilat Nautilus side stepping with 20# cable resistance along ladder (PT provided min verbal cueing to keep neutral trunk position and keep core activated)  Bilat Nautilus shoulder extension with march with 20# 2x15 (mod verbal cueing to maintain arm extension)  Nautilus forward resisted walking x4 laps with 60# (Pt. Experienced some difficulty with walking back with the wt.) (PT provided CGA) Nautilus backward walking with 50 # x4 laps  Nautilus side stepping with 30# x4 lap (Pt provided CGA) Bilat cable resisted punches with 20# 1x15  Bilat chops on Nautilus with 30# 1x15  (PT provided mod verbal cueing to maintain elbow extension and activate core)  Neuromuscular Re-education:   Airex tandem stand balance 2x15 sec. (PT provided CGA)  Airex with 3lbs ball twist to 9, 12, and 3 O'clock. 2x10 to achieved improved proprioception and increase wt. Shift) Bilat single leg cone taps to improve LE proprioception and single leg balance to decrease fall risk. 2x10 (CGA from PT).     PT Long Term Goals - 06/16/18 1611      PT LONG TERM GOAL #1   Title   Pt. will increase FOTO score to >81 in order to improve functional mobility without loss of balance during ADLs.    Baseline  Initial FOTO: 79    Time  4    Period  Weeks    Status  New    Target Date  07/14/18      PT LONG TERM GOAL #2   Title  Pt. will be able to SLB on Airex EC without LOB in order to improve functional mobility and safe ADLs.    Baseline  Pt. able to tandem balance  on Airex ~15 seconds.    Time  4    Period  Weeks    Status  New    Target Date  07/14/18      PT LONG TERM GOAL #3   Title  Pt. able to report no falls for one month in order to promote safe functional mobility at home and in community.    Baseline  Pt. has fallen 3x in the past 2 months.    Time  4    Period  Weeks    Status  New    Target Date  07/14/18      PT LONG TERM GOAL #4   Title  Pt. will be able to demonstrate safe ankle, knee and hip strategies and fall recovery in order to prevent future falls and safe functional-mobility.    Baseline  Pt. was given HEP for tandem and SLB which will progress into balance activities for HEP and in therapy. PT to educate pt. on strategies and fall recovery at next visit.    Time  4    Period  Weeks    Status  New    Target Date  07/14/18            Plan - 06/24/18 1046    Clinical Impression Statement  Pt. Entered clinic today with excellent motivation to compete therapy. Pt tolerated therex strengthening well without any issue or LOB. Pt. Demonstrated difficulty with single leg balance and neuromuscular reeducation specifically with tandem stance on airex pad. Pt. Demonstrated improved proprioception and LE balance with repeated repetitions during neuro re-ed training. PT discussed pt. Joining a gym and participating in [employer] program to follow in conjunction with PT prescribed HEP to decrease fall risk. Pt. Will benefit from additional PT skilled intervention to improve balance with functional task such as stepping up stairs or leaning over to lift items to decrease fall risk.     Clinical Presentation  Stable    Clinical Decision Making  Low    Rehab Potential  Good    Clinical Impairments Affecting Rehab Potential  (+) family support (-) hx of recent falls, extensive abdominal surgeries, age    PT Frequency  1x / week    PT Duration  4 weeks    PT Treatment/Interventions  ADLs/Self Care Home Management;Therapeutic  activities;Therapeutic exercise;Balance training;Neuromuscular re-education;Gait training;Stair training;Functional mobility training;Patient/family education;Manual techniques;Vestibular;Visual/perceptual remediation/compensation;Passive range of motion    PT Next Visit Plan  Issue extensive HEP and possible discharge    PT Home Exercise Plan  see handouts    Consulted and Agree with Plan of Care  Patient       Patient will benefit from skilled therapeutic intervention in order to improve the following deficits and impairments:  Dizziness, Pain, Postural dysfunction, Improper body mechanics, Decreased coordination, Decreased strength, Decreased endurance, Decreased activity tolerance, Decreased balance, Abnormal gait  Visit Diagnosis: Balance problem  Muscle weakness (generalized)  Gait difficulty     Problem List Patient Active Problem List   Diagnosis Date Noted  . Cyclic citrullinated peptide (CCP) antibody positive 06/04/2018  . Postural orthostatic tachycardia syndrome 06/02/2018  . Osteopenia 12/21/2017  . Postmenopausal 08/17/2017  . Medicare annual wellness visit, subsequent 08/17/2017  . Cervical spondylosis with radiculopathy 10/15/2016  . Thoracic aortic atherosclerosis (HCC) 09/08/2016  . Lung nodule < 6cm on CT 09/08/2016  . Degenerative cervical spinal stenosis 09/08/2016  . Carotid atherosclerosis, bilateral 08/21/2016  . Neck pain on left side 08/21/2016  . DNAR (do not attempt resuscitation) 08/14/2016  . Proteins serum plasma low 08/14/2016  . Preventative health care 08/14/2016  . Abnormal mammogram of both breasts 03/21/2016  . Breast pain 08/22/2015  . Breast cancer screening 08/14/2015  . Left-sided chest wall pain 06/28/2015  . Essential hypertension, benign 05/18/2015  . History of hematemesis 04/27/2015  . Partial small bowel obstruction (HCC) 04/25/2015  . Hyperlipidemia 04/22/2015  . Hx of completed stroke 04/22/2015  . Medication monitoring  encounter 04/22/2015  . Lower abdominal pain 04/22/2015  .  Esophageal reflux 04/22/2015  . Hx-TIA (transient ischemic attack) 04/22/2015  . 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 Hx of diverticulitis of colon 05/01/2014   Cammie McgeeMichael C Sherk, PT, DPT # [id number] Rosalyn ChartersJosh Livie Vanderhoof, SPT 06/25/2018, 11:41 AM  [facility] [address] [place], [place], [address] Phone: [phone]   Fax:  [phone]  Name: Yvonne Espinoza MRN: [id number] Date of Birth: 11/06/1940

## 2018-06-30 ENCOUNTER — Encounter: Payer: Self-pay | Admitting: Family Medicine

## 2018-06-30 ENCOUNTER — Ambulatory Visit (INDEPENDENT_AMBULATORY_CARE_PROVIDER_SITE_OTHER): Payer: Medicare HMO | Admitting: Family Medicine

## 2018-06-30 VITALS — BP 120/76 | HR 77 | Temp 98.0°F | Ht 60.0 in | Wt 137.1 lb

## 2018-06-30 DIAGNOSIS — R718 Other abnormality of red blood cells: Secondary | ICD-10-CM

## 2018-06-30 DIAGNOSIS — E663 Overweight: Secondary | ICD-10-CM

## 2018-06-30 DIAGNOSIS — I6523 Occlusion and stenosis of bilateral carotid arteries: Secondary | ICD-10-CM

## 2018-06-30 DIAGNOSIS — Z8673 Personal history of transient ischemic attack (TIA), and cerebral infarction without residual deficits: Secondary | ICD-10-CM

## 2018-06-30 DIAGNOSIS — I10 Essential (primary) hypertension: Secondary | ICD-10-CM

## 2018-06-30 DIAGNOSIS — M8580 Other specified disorders of bone density and structure, unspecified site: Secondary | ICD-10-CM

## 2018-06-30 DIAGNOSIS — E782 Mixed hyperlipidemia: Secondary | ICD-10-CM | POA: Diagnosis not present

## 2018-06-30 DIAGNOSIS — M4722 Other spondylosis with radiculopathy, cervical region: Secondary | ICD-10-CM

## 2018-06-30 DIAGNOSIS — R7989 Other specified abnormal findings of blood chemistry: Secondary | ICD-10-CM

## 2018-06-30 DIAGNOSIS — I700 Atherosclerosis of aorta: Secondary | ICD-10-CM

## 2018-06-30 DIAGNOSIS — R768 Other specified abnormal immunological findings in serum: Secondary | ICD-10-CM

## 2018-06-30 MED ORDER — FLUTICASONE PROPIONATE 50 MCG/ACT NA SUSP: 1.0000 | Freq: Every day | NASAL | 12 refills | Status: DC | PRN

## 2018-06-30 NOTE — Assessment & Plan Note (Signed)
Practice good fall precautions, discussed calcium and vit D; next DEXA in Aug 2021

## 2018-06-30 NOTE — Assessment & Plan Note (Signed)
Not taking any medicine, she refuses statins; encouraged diet low in saturated fats

## 2018-06-30 NOTE — Assessment & Plan Note (Signed)
Seen by rheum

## 2018-06-30 NOTE — Assessment & Plan Note (Signed)
Well controlled 

## 2018-06-30 NOTE — Patient Instructions (Addendum)
Check out the information at familydoctor.org entitled "Nutrition for Weight Loss: What You Need to Know about Fad Diets" Try to lose between 1-2 pounds per week by taking in fewer calories and burning off more calories You can succeed by limiting portions, limiting foods dense in calories and fat, becoming more active, and drinking 8 glasses of water a day (64 ounces) Don't skip meals, especially breakfast, as skipping meals may alter your metabolism Do not use over-the-counter weight loss pills or gimmicks that claim rapid weight loss A healthy BMI (or body mass index) is between 18.5 and 24.9 You can calculate your ideal BMI at the NIH website [employer]hu  Try to limit saturated fats in your diet (bologna, hot dogs, barbeque, cheeseburgers, hamburgers, steak, bacon, sausage, cheese, etc.) and get more fresh fruits, vegetables, and whole grains  Practice good fall precautions  Please have labs done at [facility] If you have not heard anything from my staff in a week about any orders/referrals/studies from today, please contact us here to follow-up [phone]

## 2018-06-30 NOTE — Assessment & Plan Note (Signed)
Goal is LDL less than 70

## 2018-06-30 NOTE — Assessment & Plan Note (Addendum)
Due for carotid scan in April; patient to call Dr. Wyn Quaker for scan orders; continue aspirin; no new neurologic episodes

## 2018-06-30 NOTE — Progress Notes (Signed)
BP 120/76   Pulse 77   Temp 98 F (36.7 C)   Ht 5' (1.524 m)   Wt 137 lb 1.6 oz (62.2 kg)   SpO2 96%   BMI 26.78 kg/m    Subjective:    Patient ID: Yvonne Espinoza, female    DOB: 04/26/1941, 78 y.o.   MRN: [id number]  HPI: Yvonne Espinoza is a 78 y.o. female  Chief Complaint  Patient presents with  . Follow-up  . Medication Refill    HPI Patient is here for f/u, history is somewhat scattered  Vitamin B12 was high, checked at Dr. Margaretmary EddyShah's office and stopped supplement  Allergies, spring and fall (fall is the worst); animal dander is bad; working out in the yard is a trigger; has eye drops too  No glaucoma, drops removed from med list  "They punctured my bladder and had to rebuild the tube to drain"; no bag  High cholesterol; does not tolerate statins; does not eat a lot of red meat; fish 3x a week; does eat barbecue once in a while; one piece of bacon a week  Lab Results  Component Value Date   CHOL 249 (H) 07/01/2018   HDL 55 07/01/2018   LDLCALC 163 (H) 07/01/2018   TRIG 156 (H) 07/01/2018   CHOLHDL 4.5 (H) 07/01/2018   Overweight; would like to weigh about 125 pounds; she carries water with her and drinks plenty  Hx of TIA; followed by neurollogist and vascular doctor; carotid testing done <50% blockage  CCP antibody positive; saw rheumatologist  Neurologist did neck MRI; also did lots of labs in February 2020; pain comes up along the back of the neck to the top of the head to the eye; Dr. Sherryll BurgerShah knows about that  MRI cervical spine  CLINICAL DATA:  Cervical radiculopathy.  Multiple recent falls.  EXAM: MRI CERVICAL SPINE WITHOUT CONTRAST  TECHNIQUE: Multiplanar, multisequence MR imaging of the cervical spine was performed. No intravenous contrast was administered.  COMPARISON:  11/19/2016  FINDINGS: Alignment: Chronic cervical spine straightening and trace multilevel degenerative listhesis, unchanged.  Vertebrae: No fracture, suspicious  osseous lesion, or significant marrow edema.  Cord: Normal signal.  Posterior Fossa, vertebral arteries, paraspinal tissues: Unremarkable.  Disc levels:  Unchanged multilevel disc space narrowing, moderate at C4-5 and C5-6.  C2-3: Small central disc protrusion and mild facet arthrosis without stenosis, unchanged.  C3-4: Moderate-sized central disc protrusion results in mild to moderate spinal stenosis and mild ventral cord flattening, unchanged. There is mild facet arthrosis and uncovertebral spurring without significant neural foraminal stenosis.  C4-5: Broad-based posterior disc osteophyte complex including a small central disc protrusion and mild facet arthrosis result in mild-to-moderate spinal stenosis and moderate left neural foraminal stenosis, unchanged.  C5-6: Mild disc bulging and uncovertebral spurring result in mild spinal stenosis and mild-to-moderate left neural foraminal stenosis, unchanged.  C6-7: Disc bulging, broad right paracentral disc protrusion, and uncovertebral spurring result in moderate spinal stenosis and severe left neural foraminal stenosis, unchanged.  C7-T1: Slight anterolisthesis with disc uncovering and severe facet arthrosis result in mild bilateral neural foraminal stenosis without spinal stenosis, unchanged.  T3-4: Only imaged sagittally. Small central/left paracentral disc extrusion and mild facet arthrosis without evidence of significant stenosis or cord compression.  IMPRESSION: Unchanged cervical disc degeneration, most notable at C6-7 where there is moderate spinal stenosis and severe left neural foraminal stenosis.   Electronically Signed   By: Sebastian AcheAllen  Grady M.D.   On: 06/23/2018 14:43  Prediabetes; lots of  dry mouth; diabetes runs in the family; drinks a little Gatorade Last A1c 5.7 in Feb 2020  Like an air bubble under the ribs and under the left breast; she sees cardiologist, Dr. Alda PonderKowalski  Osteopenia;  last DEXA August 2019; gets on ladder once in a while to empty bug catcher and changing filters; caution given  [facility] 2/9 06/30/2018 06/02/2018 08/17/2017 11/20/2016 10/15/2016  Decreased Interest 0 0 0 0 0  Down, Depressed, Hopeless 0 0 0 0 0  PHQ - 2 Score 0 0 0 0 0  Altered sleeping 0 0 - - -  Tired, decreased energy 0 0 - - -  Change in appetite 0 0 - - -  Feeling bad or failure about yourself  0 0 - - -  Trouble concentrating 0 0 - - -  Moving slowly or fidgety/restless 0 0 - - -  Suicidal thoughts 0 0 - - -  PHQ-9 Score 0 0 - - -  Difficult doing work/chores Not difficult at all Not difficult at all - - -   Fall Risk  06/30/2018 06/02/2018 08/17/2017 11/20/2016 10/15/2016  Falls in the past year? 1 1 No No No  Number falls in past yr: 1 1 - - -  Injury with Fall? 0 0 - - -    Relevant past medical, surgical, family and social history reviewed Past Medical History:  Diagnosis Date  . Abscess    colon  . Arthritis    RA, degenerative disc disease  . Collagen vascular disease (HCC)    Rhematoid Arthritis  . Colovesical fistula    2016  . Degenerative cervical spinal stenosis 09/08/2016  . Diverticulitis   . DNAR (do not attempt resuscitation) 08/14/2016   Discussed in person August 14, 2016  . Dysphagia   . GERD (gastroesophageal reflux disease)   . History of hiatal hernia   . Hyperlipidemia   . Hypertension   . Kidney cysts   . Lung nodule < 6cm on CT 09/08/2016  . Radiculopathy   . Reflux esophagitis   . SBO (small bowel obstruction) (HCC) 04/25/2015   partial   . Schatzki's ring 2018   has been stretched several times  . Spinal stenosis   . Stroke [facility](HCC) 2019   TIA 2009, told dr. dew that she has had them as recent as last year  . Thoracic aortic aneurysm (HCC)   . Thoracic aortic atherosclerosis (HCC) 09/08/2016  . TIA (transient ischemic attack)    2209,2010  . 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 Wears glasses    Past Surgical History:    Procedure Laterality Date  . CARDIOVASCULAR STRESS TEST  08/08/11 and 06/07/10  . CATARACT EXTRACTION W/PHACO Left 07/30/2017   Procedure: CATARACT EXTRACTION PHACO AND INTRAOCULAR LENS PLACEMENT (IOC);  Surgeon: Nevada CraneKing, Bradley Mark, MD;  Location: [facility] ORS;  Service: Ophthalmology;  Laterality: Left;  US 00:21.4 AP% 12.1 CDE 2.59 Fluid Pack Lot # [id number] H  . COLOSTOMY REVERSAL  09/26/2014   dr wyatt  . COLOSTOMY REVISION N/A 06/26/2014   Procedure: SIGMOID COLECTOMY WITH BLADDER REPAIR;  Surgeon: Frederik SchmidtJay Wyatt, MD;  [facility];  Service: General;  Laterality: N/A;  . CYSTOSCOPY W/ URETERAL STENT PLACEMENT Left 06/26/2014   Procedure: BOARI FLAP LEFT URETERAL REIMPLANT;  Surgeon: Chelsea AusStephen M Dahlstedt, MD;  [facility];  Service: Urology;  Laterality: Left;  . DILATION AND CURETTAGE OF UTERUS    . ESOPHAGEAL DILATION  10/10/08  . esophageal stretch  2018  ring stretched  . ESOPHAGOGASTRODUODENOSCOPY (EGD) WITH PROPOFOL N/A 11/04/2016   Procedure: ESOPHAGOGASTRODUODENOSCOPY (EGD) WITH PROPOFOL WITH DILATION;  Surgeon: [name], MD;  [facility] ENDOSCOPY;  Service: Endoscopy;  Laterality: N/A;  . FLEXIBLE SIGMOIDOSCOPY N/A 09/26/2014   Procedure: RIGID SIGMOIDOSCOPY;  Surgeon: Jimmye Norman, MD;  Location: [facility] OR;  Service: General;  Laterality: N/A;  . ILEOSTOMY N/A 06/26/2014   Procedure: LOOP ILEOSTOMY;  Surgeon: Frederik Schmidt, MD;  [facility];  Service: General;  Laterality: N/A;  . INSERTION OF ANTERIOR SEGMENT AQUEOUS DRAINAGE DEVICE (ISTENT) Left 07/30/2017   Procedure: INSERTION OF ANTERIOR SEGMENT AQUEOUS DRAINAGE DEVICE (ISTENT);  Surgeon: Nevada Crane, MD;  Location: [facility] ORS;  Service: Ophthalmology;  Laterality: Left;  . KNEE ARTHROSCOPY Bilateral   . SHOULDER SURGERY     right 2010  . TRANSVERSE LOOP COLOSTOMY N/A 09/26/2014   Procedure: LOOP COLOSTOMY REVERESAL;  Surgeon: Jimmye Norman, MD;  [facility] OR;  Service: General;  Laterality: N/A;   Family History  Problem  Relation Age of Onset  . Diabetes Mother        pre dm  . Diabetes Father   . Heart attack Father   . Heart disease Father   . Diabetes Sister   . Stroke Sister   . Glaucoma Brother   . Diabetes Brother   . Diabetes Sister   . Hypertension Son   . Hyperlipidemia Son   . Diabetes Son   . Stroke Maternal Aunt   . Diabetes Maternal Grandfather   . Cancer Neg Hx   . COPD Neg Hx   . Breast cancer Neg Hx    Social History   Tobacco Use  . Smoking status: Never Smoker  . Smokeless tobacco: Never Used  Substance Use Topics  . Alcohol use: Yes    Comment: "rarely"  . Drug use: No     [facility] Visit from 06/30/2018 in [facility]  AUDIT-C Score  0      Interim medical history since last visit reviewed. Allergies and medications reviewed  Review of Systems Per HPI unless specifically indicated above     Objective:    BP 120/76   Pulse 77   Temp 98 F (36.7 C)   Ht 5' (1.524 m)   Wt 137 lb 1.6 oz (62.2 kg)   SpO2 96%   BMI 26.78 kg/m   Wt Readings from Last 3 Encounters:  06/30/18 137 lb 1.6 oz (62.2 kg)  06/02/18 136 lb 11.2 oz (62 kg)  08/17/17 137 lb 6.4 oz (62.3 kg)    Physical Exam Constitutional:      General: She is not in acute distress.    Appearance: She is well-developed. She is not diaphoretic.  HENT:     Head: Normocephalic and atraumatic.  Eyes:     General: No scleral icterus. Neck:     Thyroid: No thyromegaly.  Cardiovascular:     Rate and Rhythm: Normal rate and regular rhythm.     Heart sounds: Normal heart sounds. No murmur.  Pulmonary:     Effort: Pulmonary effort is normal. No respiratory distress.     Breath sounds: Normal breath sounds. No wheezing.  Abdominal:     General: Bowel sounds are normal. There is no distension.     Palpations: Abdomen is soft.  Skin:    General: Skin is warm and dry.     Coloration: Skin is not pale.  Neurological:     Mental Status: She is  alert.  Psychiatric:        Behavior:  Behavior normal.        Thought Content: Thought content normal.        Judgment: Judgment normal.       Assessment & Plan:   Problem List Items Addressed This Visit      Cardiovascular and Mediastinum   Thoracic aortic atherosclerosis (HCC)    Goal is LDL less than 70      Essential hypertension, benign (Chronic)    Well-controlled      Carotid atherosclerosis, bilateral    Due for carotid scan in April; patient to call Dr. Wyn Quaker for scan orders; continue aspirin; no new neurologic episodes        Nervous and Auditory   Cervical spondylosis with radiculopathy    Managed by neurologist, seeing PT; no plans to see surgeon at this time        Musculoskeletal and Integument   Osteopenia    Practice good fall precautions, discussed calcium and vit D; next DEXA in Aug 2021        Other   Overweight (BMI 25.0-29.9)    Reduce portion sizes a little, stay active, continue hydration, see AVS; she'll try to reduce some fatty foods      Hyperlipidemia (Chronic)    Not taking any medicine, she refuses statins; encouraged diet low in saturated fats      Relevant Orders   Lipid panel (Completed)   Hx-TIA (transient ischemic attack) (Chronic)    Followed by vascular; goal LDL is less than 70; asked again about Repatha, she had such a bad time with MTX; she'll try diet; did not tolerate statin or zetia      Cyclic citrullinated peptide (CCP) antibody positive    Seen by rheum       Other Visit Diagnoses    High hematocrit    -  Primary   Relevant Orders   CBC (Completed)   Hypercalcemia       Relevant Orders   Basic metabolic panel (Completed)       Follow up plan: Return in about 6 months (around 12/29/2018) for follow-up visit with Dr. Sherie Don.  An after-visit summary was printed and given to the patient at check-out.  Please see the patient instructions which may contain other information and recommendations beyond what is mentioned above in the assessment and  plan.  Meds ordered this encounter  Medications  . fluticasone (FLONASE) 50 MCG/ACT nasal spray    Sig: Place 1 spray into both nostrils daily as needed for allergies.    Dispense:  16 g    Refill:  12    Orders Placed This Encounter  Procedures  . Lipid panel  . CBC  . Basic metabolic panel

## 2018-06-30 NOTE — Assessment & Plan Note (Signed)
Reduce portion sizes a little, stay active, continue hydration, see AVS; she'll try to reduce some fatty foods

## 2018-06-30 NOTE — Assessment & Plan Note (Signed)
Managed by neurologist, seeing PT; no plans to see surgeon at this time

## 2018-06-30 NOTE — Assessment & Plan Note (Signed)
Followed by vascular; goal LDL is less than 70; asked again about Repatha, she had such a bad time with MTX; she'll try diet; did not tolerate statin or zetia

## 2018-07-01 ENCOUNTER — Ambulatory Visit: Payer: Medicare HMO | Admitting: Physical Therapy

## 2018-07-01 ENCOUNTER — Encounter: Payer: Self-pay | Admitting: Physical Therapy

## 2018-07-01 DIAGNOSIS — R2689 Other abnormalities of gait and mobility: Secondary | ICD-10-CM | POA: Diagnosis not present

## 2018-07-01 DIAGNOSIS — R269 Unspecified abnormalities of gait and mobility: Secondary | ICD-10-CM

## 2018-07-01 DIAGNOSIS — M6281 Muscle weakness (generalized): Secondary | ICD-10-CM

## 2018-07-01 NOTE — Therapy (Signed)
[facility] [address]. [place], [place], [address] Phone: [phone]   Fax:  [phone]  Physical Therapy Treatment/Discharge   Patient Details  Name: Yvonne Espinoza MRN: [id number] Date of Birth: [birth year] Referring Provider (PT): Dr. Enid Derry   Encounter Date: 07/01/2018  PT End of Session - 07/01/18 1340    Visit Number  3    Number of Visits  5    Date for PT Re-Evaluation  07/14/18    PT Start Time  1340    PT Stop Time  1434    PT Time Calculation (min)  54 min    Activity Tolerance  Patient tolerated treatment well    Behavior During Therapy  [facility] for tasks assessed/performed       Past Medical History:  Diagnosis Date  . Abscess    colon  . Arthritis    RA, degenerative disc disease  . Collagen vascular disease (HCC)    Rhematoid Arthritis  . Colovesical fistula    2016  . Degenerative cervical spinal stenosis 09/08/2016  . Diverticulitis   . DNAR (do not attempt resuscitation) 08/14/2016   Discussed in person August 14, 2016  . Dysphagia   . GERD (gastroesophageal reflux disease)   . History of hiatal hernia   . Hyperlipidemia   . Hypertension   . Kidney cysts   . Lung nodule < 6cm on CT 09/08/2016  . Radiculopathy   . Reflux esophagitis   . SBO (small bowel obstruction) ([place]) 04/25/2015   partial   . Schatzki's ring 2018   has been stretched several times  . Spinal stenosis   . Stroke [facility]) 2019   TIA 2009, told dr. dew that she has had them as recent as last year  . Thoracic aortic aneurysm ([place])   . Thoracic aortic atherosclerosis ([place]) 09/08/2016  . TIA (transient ischemic attack)    2209,2010  . 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 Wears glasses     Past Surgical History:  Procedure Laterality Date  . CARDIOVASCULAR STRESS TEST  08/08/11 and 06/07/10  . CATARACT EXTRACTION W/PHACO Left 07/30/2017   Procedure: CATARACT EXTRACTION PHACO AND INTRAOCULAR LENS PLACEMENT (IOC);   Surgeon: Eulogio Bear, MD;  Location: [facility] ORS;  Service: Ophthalmology;  Laterality: Left;  [place] 00:21.4 AP% 12.1 CDE 2.59 Fluid Pack Lot # [id number] H  . COLOSTOMY REVERSAL  09/26/2014   dr wyatt  . COLOSTOMY REVISION N/A 06/26/2014   Procedure: SIGMOID COLECTOMY WITH BLADDER REPAIR;  Surgeon: Doreen Salvage, MD;  Location: [place];  Service: General;  Laterality: N/A;  . CYSTOSCOPY W/ URETERAL STENT PLACEMENT Left 06/26/2014   Procedure: BOARI FLAP LEFT URETERAL REIMPLANT;  Surgeon: Jorja Loa, MD;  Location: [place];  Service: Urology;  Laterality: Left;  . DILATION AND CURETTAGE OF UTERUS    . ESOPHAGEAL DILATION  10/10/08  . esophageal stretch  2018   ring stretched  . ESOPHAGOGASTRODUODENOSCOPY (EGD) WITH PROPOFOL N/A 11/04/2016   Procedure: ESOPHAGOGASTRODUODENOSCOPY (EGD) WITH PROPOFOL WITH DILATION;  Surgeon: Jonathon Bellows, MD;  Location: [facility] ENDOSCOPY;  Service: Endoscopy;  Laterality: N/A;  . FLEXIBLE SIGMOIDOSCOPY N/A 09/26/2014   Procedure: RIGID SIGMOIDOSCOPY;  Surgeon: Judeth Horn, MD;  Location: [place];  Service: General;  Laterality: N/A;  . ILEOSTOMY N/A 06/26/2014   Procedure: LOOP ILEOSTOMY;  Surgeon: Doreen Salvage, MD;  Location: [place];  Service: General;  Laterality: N/A;  . INSERTION OF ANTERIOR SEGMENT AQUEOUS DRAINAGE DEVICE (ISTENT) Left 07/30/2017  Procedure: INSERTION OF ANTERIOR SEGMENT AQUEOUS DRAINAGE DEVICE (ISTENT);  Surgeon: Eulogio Bear, MD;  Location: [facility] ORS;  Service: Ophthalmology;  Laterality: Left;  . KNEE ARTHROSCOPY Bilateral   . SHOULDER SURGERY     right 2010  . TRANSVERSE LOOP COLOSTOMY N/A 09/26/2014   Procedure: LOOP COLOSTOMY REVERESAL;  Surgeon: Judeth Horn, MD;  Location: [place];  Service: General;  Laterality: N/A;    There were no vitals filed for this visit.  Subjective Assessment - 07/02/18 0815    Subjective  Pt. entered clinic today whithout complaints of pain. Pt. 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 Pt. reported having no falls or dizzy episodes since last visit.     Pertinent History  Pt. denies LOB when walking. Pt. used to go to [facility] regularly and has 2# weights at home and will try to exercise with them at night.    How long can you walk comfortably?  pt. walks regularly everyday, no AD.     Patient Stated Goals  Improve LE strength/ prevent falls    Currently in Pain?  No/denies       Therex:   Bilat Nautilus side stepping with 30# cable resistance along ladder (PT provided min verbal cueing to keep neutral trunk position) Nautilus forward walking with 50 lbs resistance x4  Nautilus backward walking with 50 lbs resistance x4 (PT provided min verbal cueing to walk slower to activate quads)  Bilat standing hip abudciton with RTB 2x15 (PT provided mod verbal cueing for pt. To maintain upright posture/spine with hip abduction) Bilat standing hip extension with RTB 2x15 Cone toe taps along floor ladder to improve proprioception/balance of LE x4 laps (PT provided mod verbal cueing for pt. To take larger steps between cones to challenge balance more)  45+ second tandem balance holds each with left/right forward.  Bilat single leg stance holds in //-bars (PT provided CGA) 1x6 seconds on left and 1x8 sec on right 6 sec on left 8 sec on right for single leg balance   Reassessment:   AROM: All AROM WNL   MMT (R/L): hip flexion 4/5, 4/5 Hip add: 4+ /5, 4+/5 Hip abd: 5/5, 5/5 Knee ext: 5/5, 5/5 Knee flex: 5/5, 5/5 DF: 5/5, 5/5  Balance Assessment: Tandem balance: 45+ sec bilat with both left and right foot forward  SLB: 6 sec on left foot and 8 sec on right foot.       PT Long Term Goals - 07/02/18 0816      PT LONG TERM GOAL #1   Title   Pt. will increase FOTO score to >81 in order to improve functional mobility without loss of balance during ADLs.    Baseline  Initial FOTO: 79    Time  4    Period  Weeks    Status   Deferred    Target Date  07/01/18      PT LONG TERM GOAL #2   Title  Pt. will be able to SLB on Airex EC without LOB in order to improve functional mobility and safe ADLs.    Baseline  Pt. able to tandem balance on Airex ~15 seconds.    Time  4    Period  Weeks    Status  Partially Met    Target Date  07/01/18      PT LONG TERM GOAL #3   Title  Pt. able to report no falls  for one month in order to promote safe functional mobility at home and in community.    Baseline  Pt. has fallen 3x in the past 2 months.    Time  4    Period  Weeks    Status  Achieved    Target Date  07/01/18      PT LONG TERM GOAL #4   Title  Pt. will be able to demonstrate safe ankle, knee and hip strategies and fall recovery in order to prevent future falls and safe functional-mobility.    Baseline  Pt. was given HEP for tandem and SLB which will progress into balance activities for HEP and in therapy. PT to educate pt. on strategies and fall recovery at next visit.    Time  4    Period  Weeks    Status  Achieved    Target Date  07/01/18         Plan - 07/02/18 0815    Clinical Impression Statement  Pt. demonstrated excellent motivation for therapy today. Pt. was limited with left leg weakness compared to the right. Pt. demonstrated more difficulty with resisted walkouts when pushing off with the left leg. Pt. experienced usual muscle burn during the session but without any usual fatigue. Pt. demonstrated excellent LE strength with MMT scoring 5/5 with the exception of hip flexion, which was 4+/5 bilaterally. Pt. improved tandem stance balance from ~30 seconds during visit to 45 seconds bilaterally today. Pt. was educated to use pool at gym for therapy as well as participate in exercises classes to continue improving LE balance/endurance.    Clinical Presentation  Stable    Clinical Decision Making  Low    Rehab Potential  Good    Clinical Impairments Affecting Rehab Potential  (+) family support (-) hx of  recent falls, extensive abdominal surgeries, age    PT Frequency  1x / week    PT Treatment/Interventions  ADLs/Self Care Home Management;Therapeutic activities;Therapeutic exercise;Balance training;Neuromuscular re-education;Gait training;Stair training;Functional mobility training;Patient/family education;Manual techniques;Vestibular;Visual/perceptual remediation/compensation;Passive range of motion    PT Next Visit Plan  Issue extensive HEP and possible discharge    PT Home Exercise Plan  see handouts    Consulted and Agree with Plan of Care  Patient       Patient will benefit from skilled therapeutic intervention in order to improve the following deficits and impairments:  Dizziness, Pain, Postural dysfunction, Improper body mechanics, Decreased coordination, Decreased strength, Decreased endurance, Decreased activity tolerance, Decreased balance, Abnormal gait  Visit Diagnosis: Balance problem  Muscle weakness (generalized)  Gait difficulty     Problem List Patient Active Problem List   Diagnosis Date Noted  . Overweight (BMI 25.0-29.9) 06/30/2018  . Cyclic citrullinated peptide (CCP) antibody positive 06/04/2018  . Postural orthostatic tachycardia syndrome 06/02/2018  . Osteopenia 12/21/2017  . Postmenopausal 08/17/2017  . Medicare annual wellness visit, subsequent 08/17/2017  . Cervical spondylosis with radiculopathy 10/15/2016  . Thoracic aortic atherosclerosis ([place]) 09/08/2016  . Lung nodule < 6cm on CT 09/08/2016  . Degenerative cervical spinal stenosis 09/08/2016  . Carotid atherosclerosis, bilateral 08/21/2016  . Neck pain on left side 08/21/2016  . DNAR (do not attempt resuscitation) 08/14/2016  . Proteins serum plasma low 08/14/2016  . Preventative health care 08/14/2016  . Abnormal mammogram of both breasts 03/21/2016  . Breast pain 08/22/2015  . Breast cancer screening 08/14/2015  . Left-sided chest wall pain 06/28/2015  . Essential hypertension, benign  05/18/2015  . History of hematemesis 04/27/2015  .  Hyperlipidemia 04/22/2015  . Medication monitoring encounter 04/22/2015  . Lower abdominal pain 04/22/2015  . Esophageal reflux 04/22/2015  . Hx-TIA (transient ischemic attack) 04/22/2015  . 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 Hx of diverticulitis of colon 05/01/2014   Pura Spice, PT, DPT # [id number] 07/02/2018, 2:31 PM  [facility] [address] [place], [place], [address] Phone: [phone]   Fax:  [phone]  Name: Yvonne Espinoza MRN: [id number] Date of Birth: [birth year]

## 2018-07-02 LAB — BASIC METABOLIC PANEL
BUN/Creatinine Ratio: 19 (ref 12–28)
BUN: 15 mg/dL (ref 8–27)
CO2: 25 mmol/L (ref 20–29)
CREATININE: 0.8 mg/dL (ref 0.57–1.00)
Calcium: 9.7 mg/dL (ref 8.7–10.3)
Chloride: 104 mmol/L (ref 96–106)
GFR calc Af Amer: 83 mL/min/{1.73_m2} (ref >59)
GFR calc non Af Amer: 72 mL/min/{1.73_m2} (ref >59)
Glucose: 95 mg/dL (ref 65–99)
Potassium: 5 mmol/L (ref 3.5–5.2)
Sodium: 145 mmol/L — ABNORMAL HIGH (ref 134–144)

## 2018-07-02 LAB — LIPID PANEL
Chol/HDL Ratio: 4.5 ratio — ABNORMAL HIGH (ref 0.0–4.4)
Cholesterol, Total: 249 mg/dL — ABNORMAL HIGH (ref 100–199)
HDL: 55 mg/dL (ref >39)
LDL Calculated: 163 mg/dL — ABNORMAL HIGH (ref 0–99)
Triglycerides: 156 mg/dL — ABNORMAL HIGH (ref 0–149)
VLDL Cholesterol Cal: 31 mg/dL (ref 5–40)

## 2018-07-02 LAB — CBC
HEMATOCRIT: 42.1 % (ref 34.0–46.6)
Hemoglobin: 14 g/dL (ref 11.1–15.9)
MCH: 30 pg (ref 26.6–33.0)
MCHC: 33.3 g/dL (ref 31.5–35.7)
MCV: 90 fL (ref 79–97)
Platelets: 360 10*3/uL (ref 150–450)
RBC: 4.67 x10E6/uL (ref 3.77–5.28)
RDW: 13.6 % (ref 11.7–15.4)
WBC: 6.5 10*3/uL (ref 3.4–10.8)

## 2018-07-28 DIAGNOSIS — M79672 Pain in left foot: Secondary | ICD-10-CM | POA: Insufficient documentation

## 2018-12-09 ENCOUNTER — Ambulatory Visit: Payer: Medicare HMO | Admitting: Nurse Practitioner

## 2018-12-10 ENCOUNTER — Other Ambulatory Visit: Payer: Self-pay

## 2018-12-10 ENCOUNTER — Encounter: Payer: Self-pay | Admitting: Nurse Practitioner

## 2018-12-10 ENCOUNTER — Ambulatory Visit (INDEPENDENT_AMBULATORY_CARE_PROVIDER_SITE_OTHER): Payer: Medicare HMO | Admitting: Nurse Practitioner

## 2018-12-10 VITALS — BP 144/57 | HR 81 | Ht 59.5 in | Wt 138.2 lb

## 2018-12-10 DIAGNOSIS — S46812A Strain of other muscles, fascia and tendons at shoulder and upper arm level, left arm, initial encounter: Secondary | ICD-10-CM

## 2018-12-10 DIAGNOSIS — Z7689 Persons encountering health services in other specified circumstances: Secondary | ICD-10-CM

## 2018-12-10 NOTE — Patient Instructions (Addendum)
Yvonne Espinoza,   Thank you for coming in to clinic today.  1. Subscapular muscle strain. - START shoulder range of motion exercises. - Take Aleve 220 mg one tablet twice daily for 10 days to improve your inflammation. - Continue heat - Try massage against a wall with a tennis ball.   Call clinic if you decide you want to try a muscle relaxer. - Also call if you would want a trigger point injection to release muscle.    Physical therapy is also an option.  Please schedule a follow-up appointment with Wilhelmina Mcardle, AGNP. Return in about 3 months (around 03/12/2019) for GERD  AND in 2-4 weeks as needed..  If you have any other questions or concerns, please feel free to call the clinic or send a message through MyChart. You may also schedule an earlier appointment if necessary.  You will receive a survey after today's visit either digitally by e-mail or paper by [employer]. Your experiences and feedback matter to [place].  Please respond so we know how we are doing as we provide care for you.   Wilhelmina Mcardle, DNP, AGNP-BC Adult Gerontology Nurse Practitioner [facility], [facility]   Shoulder Range of Motion Exercises Shoulder range of motion (ROM) exercises are done to keep the shoulder moving freely or to increase movement. They are often recommended for people who have shoulder pain or stiffness or who are recovering from a shoulder surgery. Phase 1 exercises When you are able, do this exercise 1-2 times per day for 30-60 seconds in each direction, or as directed by your health care provider. Pendulum exercise To do this exercise while sitting: 1. Sit in a chair or at the edge of your bed with your feet flat on the floor. 2. Let your affected arm hang down in front of you over the edge of the bed or chair. 3. Relax your shoulder, arm, and hand. 4. Rock your body so your arm gently swings in small circles. You can also use your unaffected arm to start the motion. 5.  Repeat changing the direction of the circles, swinging your arm left and right, and swinging your arm forward and back. To do this exercise while standing: 1. Stand next to a sturdy chair or table, and hold on to it with your hand on your unaffected side. 2. Bend forward at the waist. 3. Bend your knees slightly. 4. Relax your shoulder, arm, and hand. 5. While keeping your shoulder relaxed, use body motion to swing your arm in small circles. 6. Repeat changing the direction of the circles, swinging your arm left and right, and swinging your arm forward and back. 7. Between exercises, stand up tall and take a short break to relax your lower back.  Phase 2 exercises Do these exercises 1-2 times per day or as told by your health care provider. Hold each stretch for 30 seconds, and repeat 3 times. Do the exercises with one or both arms as instructed by your health care provider. For these exercises, sit at a table with your hand and arm supported by the table. A chair that slides easily or has wheels can be helpful. External rotation 1. Turn your chair so that your affected side is nearest to the table. 2. Place your forearm on the table to your side. Bend your elbow about 90 at the elbow (right angle) and place your hand palm facing down on the table. Your elbow should be about 6 inches away from your  side. 3. Keeping your arm on the table, lean your body forward. Abduction 1. Turn your chair so that your affected side is nearest to the table. 2. Place your forearm and hand on the table so that your thumb points toward the ceiling and your arm is straight out to your side. 3. Slide your hand out to the side and away from you, using your unaffected arm to do the work. 4. To increase the stretch, you can slide your chair away from the table. Flexion: forward stretch 1. Sit facing the table. Place your hand and elbow on the table in front of you. 2. Slide your hand forward and away from you, using  your unaffected arm to do the work. 3. To increase the stretch, you can slide your chair backward. Phase 3 exercises Do these exercises 1-2 times per day or as told by your health care provider. Hold each stretch for 30 seconds, and repeat 3 times. Do the exercises with one or both arms as instructed by your health care provider. Cross-body stretch: posterior capsule stretch 1. Lift your arm straight out in front of you. 2. Bend your arm 90 at the elbow (right angle) so your forearm moves across your body. 3. Use your other arm to gently pull the elbow across your body, toward your other shoulder. Wall climbs 1. Stand with your affected arm extended out to the side with your hand resting on a door frame. 2. Slide your hand slowly up the door frame. 3. To increase the stretch, step through the door frame. Keep your body upright and do not lean. Wand exercises You will need a cane, a piece of PVC pipe, or a sturdy wooden dowel for wand exercises. Flexion To do this exercise while standing: 1. Hold the wand with both of your hands, palms down. 2. Using the other arm to help, lift your arms up and over your head, if able. 3. Push upward with your other arm to gently increase the stretch. To do this exercise while lying down: 1. Lie on your back with your elbows resting on the floor and the wand in both your hands. Your hands will be palm down, or pointing toward your feet. 2. Lift your hands toward the ceiling, using your unaffected arm to help if needed. 3. Bring your arms overhead as able, using your unaffected arm to help if needed. Internal rotation 1. Stand while holding the wand behind you with both hands. Your unaffected arm should be extended above your head with the arm of the affected side extended behind you at the level of your waist. The wand should be pointing straight up and down as you hold it. 2. Slowly pull the wand up behind your back by straightening the elbow of your  unaffected arm and bending the elbow of your affected arm. External rotation 1. Lie on your back with your affected upper arm supported on a small pillow or rolled towel. When you first do this exercise, keep your upper arm close to your body. Over time, bring your arm up to a 90 angle out to the side. 2. Hold the wand across your stomach and with both hands palm up. Your elbow on your affected side should be bent at a 90 angle. 3. Use your unaffected side to help push your forearm away from you and toward the floor. Keep your elbow on your affected side bent at a 90 angle. Contact a health care provider if you have:  New  or increasing pain.  New numbness, tingling, weakness, or discoloration in your arm or hand. This information is not intended to replace advice given to you by your health care provider. Make sure you discuss any questions you have with your health care provider. Document Released: 01/18/2003 Document Revised: 06/03/2017 Document Reviewed: 06/03/2017 Elsevier Patient Education  2020 [employer].

## 2018-12-10 NOTE — Progress Notes (Signed)
Subjective:    Patient ID: Yvonne Espinoza, female    DOB: 01/24/1941, 78 y.o.   MRN: [id number]  Yvonne Espinoza is a 78 y.o. female presenting on 12/10/2018 for Establish Care (constant dull ache  RUQ back pain x 3 weeks. The pt state the pain worsen with sitting and lying. The pain radiates around to the breast. )   HPI Establish Care New Provider Pt last seen by PCP Dr. Sherie DonLada about 6 months ago.  Obtain records from Epic.    Right upper Back Pain Patient started having pressure under RIGHT shoulder blade about 3 weeks ago.  Patient is sleeping in recliner until last 2-3 nights. Feels pressure on shoulder blade when lying flat. Admits she doesn't have problems when standing.  Feels it only occasionally when lifting things.  Radiating around to RIGHT breast.   - Patient is taking Aleve and Tylenol.  She has more pain with deep inspiration on RIGHT side.   - Patient has had no falls or injury. - Patient is still mowing grass with push mower, housework/ lifting laundry baskets. - Patient is still able to lift objects without pain until lying down. - patient notes she is currently primary caregiver for her 77 year old husband.  She is also caring for a sick son who is in his 5850s.  - Patient has history of degenerative cervical spinal stenosis.  Did have some neck pain yesterday, but notes current pain is not associated and is not similar to cervical spine pain.  Past Medical History:  Diagnosis Date  . Abscess    colon  . Arthritis    RA, degenerative disc disease  . Collagen vascular disease (HCC)    Rhematoid Arthritis  . Colovesical fistula    2016  . Degenerative cervical spinal stenosis 09/08/2016  . Diverticulitis   . DNAR (do not attempt resuscitation) 08/14/2016   Discussed in person August 14, 2016  . Dysphagia   . GERD (gastroesophageal reflux disease)   . History of hiatal hernia   . Hyperlipidemia   . Hypertension   . Kidney cysts   . Lung nodule < 6cm on CT  09/08/2016  . Radiculopathy   . Reflux esophagitis   . SBO (small bowel obstruction) (HCC) 04/25/2015   partial   . Schatzki's ring 2018   has been stretched several times  . Spinal stenosis   . Stroke [facility](HCC) 2019   TIA 2009, told dr. dew that she has had them as recent as last year  . Thoracic aortic aneurysm (HCC)   . Thoracic aortic atherosclerosis (HCC) 09/08/2016  . TIA (transient ischemic attack)    2209,2010  . 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 Wears glasses    Past Surgical History:  Procedure Laterality Date  . CARDIOVASCULAR STRESS TEST  08/08/11 and 06/07/10  . CATARACT EXTRACTION W/PHACO Left 07/30/2017   Procedure: CATARACT EXTRACTION PHACO AND INTRAOCULAR LENS PLACEMENT (IOC);  Surgeon: Nevada CraneKing, Bradley Mark, MD;  Location: [facility] ORS;  Service: Ophthalmology;  Laterality: Left;  US 00:21.4 AP% 12.1 CDE 2.59 Fluid Pack Lot # [id number]  . COLOSTOMY REVERSAL  09/26/2014   dr wyatt  . COLOSTOMY REVISION N/A 06/26/2014   Procedure: SIGMOID COLECTOMY WITH BLADDER REPAIR;  Surgeon: Frederik SchmidtJay Wyatt, MD;  Location: [facility] OR;  Service: General;  Laterality: N/A;  . CYSTOSCOPY W/ URETERAL STENT PLACEMENT Left 06/26/2014   Procedure: BOARI FLAP LEFT URETERAL REIMPLANT;  Surgeon: Chelsea AusStephen M Dahlstedt, MD;  Location: [facility] OR;  Service: Urology;  Laterality: Left;  . DILATION AND CURETTAGE OF UTERUS    . ESOPHAGEAL DILATION  10/10/08  . esophageal stretch  2018   ring stretched  . ESOPHAGOGASTRODUODENOSCOPY (EGD) WITH PROPOFOL N/A 11/04/2016   Procedure: ESOPHAGOGASTRODUODENOSCOPY (EGD) WITH PROPOFOL WITH DILATION;  Surgeon: [name], MD;  Location: [facility] ENDOSCOPY;  Service: Endoscopy;  Laterality: N/A;  . FLEXIBLE SIGMOIDOSCOPY N/A 09/26/2014   Procedure: RIGID SIGMOIDOSCOPY;  Surgeon: Jimmye Norman, MD;  Location: [facility] OR;  Service: General;  Laterality: N/A;  . ILEOSTOMY N/A 06/26/2014   Procedure: LOOP ILEOSTOMY;  Surgeon: Frederik Schmidt, MD;  Location: [facility] OR;  Service: General;  Laterality: N/A;  .  INSERTION OF ANTERIOR SEGMENT AQUEOUS DRAINAGE DEVICE (ISTENT) Left 07/30/2017   Procedure: INSERTION OF ANTERIOR SEGMENT AQUEOUS DRAINAGE DEVICE (ISTENT);  Surgeon: Nevada Crane, MD;  Location: [facility] ORS;  Service: Ophthalmology;  Laterality: Left;  . KNEE ARTHROSCOPY Bilateral   . SHOULDER SURGERY     right 2010  . TRANSVERSE LOOP COLOSTOMY N/A 09/26/2014   Procedure: LOOP COLOSTOMY REVERESAL;  Surgeon: Jimmye Norman, MD;  Location: [facility] OR;  Service: General;  Laterality: N/A;   Social History   Socioeconomic History  . Marital status: Married    Spouse name: Not on file  . Number of children: Not on file  . Years of education: Not on file  . Highest education level: Not on file  Occupational History  . Not on file  Social Needs  . Financial resource strain: Not on file  . Food insecurity    Worry: Not on file    Inability: Not on file  . Transportation needs    Medical: Not on file    Non-medical: Not on file  Tobacco Use  . Smoking status: Never Smoker  . Smokeless tobacco: Never Used  Substance and Sexual Activity  . Alcohol use: Yes    Comment: "rarely"  . Drug use: No  . Sexual activity: Yes  Lifestyle  . Physical activity    Days per week: Not on file    Minutes per session: Not on file  . Stress: Not on file  Relationships  . Social Musician on phone: Not on file    Gets together: Not on file    Attends religious service: Not on file    Active member of club or organization: Not on file    Attends meetings of clubs or organizations: Not on file    Relationship status: Not on file  . Intimate partner violence    Fear of current or ex partner: Not on file    Emotionally abused: Not on file    Physically abused: Not on file    Forced sexual activity: Not on file  Other Topics Concern  . Not on file  Social History Narrative  . Not on file   Family History  Problem Relation Age of Onset  . Diabetes Mother        pre dm  . Diabetes Father    . Heart attack Father   . Heart disease Father   . Diabetes Sister   . Stroke Sister   . Glaucoma Brother   . Diabetes Brother   . Diabetes Sister   . Hypertension Son   . Hyperlipidemia Son   . Diabetes Son   . Stroke Maternal Aunt   . Diabetes Maternal Grandfather   . Cancer Neg Hx   . COPD Neg Hx   . Breast cancer  Neg Hx    Current Outpatient Medications on File Prior to Visit  Medication Sig  . aspirin EC 325 MG tablet Take 325 mg by mouth daily.  . Coenzyme Q10 (CO Q 10) 100 MG CAPS Take 100 mg by mouth daily.   Marland Kitchen FIBER ADULT GUMMIES PO Take 5 g by mouth 2 (two) times daily.  . fluticasone (FLONASE) 50 MCG/ACT nasal spray Place 1 spray into both nostrils daily as needed for allergies.  . folic acid (FOLVITE) 494 MCG tablet Take 400 mcg by mouth daily.   Marland Kitchen ketotifen (ZADITOR) 0.025 % ophthalmic solution 1 drop 2 (two) times daily.  . Lutein 20 MG CAPS Take 40 mg by mouth daily.  . Magnesium 250 MG TABS Take 250 mg by mouth daily.   . Multiple Vitamin (MULTIVITAMIN WITH MINERALS) TABS tablet Take 1 tablet by mouth daily.  . Multiple Vitamins-Minerals (PRESERVISION AREDS 2 PO) Take 1 tablet by mouth daily.  Vladimir Faster Glycol-Propyl Glycol (SYSTANE ULTRA OP) Place 1 drop into both eyes 2 (two) times daily as needed (for dry eyes).   . triamcinolone cream (KENALOG) 0.1 % Apply 1 application topically 2 (two) times daily. Mix one to one with gentle moisturizing lotion; as needed   No current facility-administered medications on file prior to visit.     Review of Systems Per HPI unless specifically indicated above     Objective:    BP (!) 144/57 (BP Location: Left Arm, Patient Position: Sitting, Cuff Size: Normal)   Pulse 81   Ht 4' 11.5" (1.511 m)   Wt 138 lb 3.2 oz (62.7 kg)   BMI 27.45 kg/m   Wt Readings from Last 3 Encounters:  06/30/18 137 lb 1.6 oz (62.2 kg)  06/02/18 136 lb 11.2 oz (62 kg)  08/17/17 137 lb 6.4 oz (62.3 kg)    Physical Exam Vitals signs  reviewed.  Constitutional:      General: She is not in acute distress.    Appearance: She is well-developed.  HENT:     Head: Normocephalic and atraumatic.  Cardiovascular:     Rate and Rhythm: Normal rate and regular rhythm.     Pulses:          Radial pulses are 2+ on the right side and 2+ on the left side.       Posterior tibial pulses are 1+ on the right side and 1+ on the left side.     Heart sounds: Normal heart sounds, S1 normal and S2 normal.  Pulmonary:     Effort: Pulmonary effort is normal. No respiratory distress.     Breath sounds: Normal breath sounds and air entry.  Musculoskeletal:     Left shoulder: She exhibits pain (with resistance to downward arm movements only) and spasm (see image). She exhibits normal range of motion, no tenderness, no bony tenderness, no deformity, no laceration and normal strength.     Thoracic back: Normal. She exhibits normal range of motion, no tenderness, no bony tenderness and no swelling.       Arms:     Right lower leg: No edema.     Left lower leg: No edema.  Skin:    General: Skin is warm and dry.     Capillary Refill: Capillary refill takes less than 2 seconds.  Neurological:     Mental Status: She is alert and oriented to person, place, and time.  Psychiatric:        Attention and Perception: Attention normal.  Mood and Affect: Mood and affect normal.        Behavior: Behavior normal. Behavior is cooperative.       Results for orders placed or performed in visit on 06/30/18  Lipid panel  Result Value Ref Range   Cholesterol, Total 249 (H) 100 - 199 mg/dL   Triglycerides 341 (H) 0 - 149 mg/dL   HDL 55 >93 mg/dL   VLDL Cholesterol Cal 31 5 - 40 mg/dL   LDL Calculated 790 (H) 0 - 99 mg/dL   Chol/HDL Ratio 4.5 (H) 0.0 - 4.4 ratio  CBC  Result Value Ref Range   WBC 6.5 3.4 - 10.8 x10E3/uL   RBC 4.67 3.77 - 5.28 x10E6/uL   Hemoglobin 14.0 11.1 - 15.9 g/dL   Hematocrit 24.0 97.3 - 46.6 %   MCV 90 79 - 97 fL   MCH  30.0 26.6 - 33.0 pg   MCHC 33.3 31.5 - 35.7 g/dL   RDW 53.2 99.2 - 42.6 %   Platelets 360 150 - 450 x10E3/uL  Basic metabolic panel  Result Value Ref Range   Glucose 95 65 - 99 mg/dL   BUN 15 8 - 27 mg/dL   Creatinine, Ser 8.34 0.57 - 1.00 mg/dL   GFR calc non Af Amer 72 >59 mL/min/1.73   GFR calc Af Amer 83 >59 mL/min/1.73   BUN/Creatinine Ratio 19 12 - 28   Sodium 145 (H) 134 - 144 mmol/L   Potassium 5.0 3.5 - 5.2 mmol/L   Chloride 104 96 - 106 mmol/L   CO2 25 20 - 29 mmol/L   Calcium 9.7 8.7 - 10.3 mg/dL      Assessment & Plan:   Problem List Items Addressed This Visit    None    Visit Diagnoses    Strain of left subscapularis muscle, initial encounter    -  Primary Pain likely self-limited.  Muscle strain possible complicated by overuse with increased housework responsibilities.  Plan:  1. Treat with OTC pain meds (acetaminophen and Aleve).  Discussed alternate dosing and max dosing. 2. Apply heat and/or ice to affected area. Use self massage with tennis ball on wall to release muscle. 3. May also apply a muscle rub with lidocaine or lidocaine patch after heat or ice. 4. Offered to take muscle relaxer baclofen 5 mg at bedtime considered.  Patient declines at this time.   5. Consider future physical therapy prn. 6. Follow up 2-4 weeks prn.     Encounter to establish care     Previous PCP was at Dr. Sherie Don.  Records are reviewed today in [facility].  Past medical, family, and surgical history reviewed w/ pt.        Follow up plan: Return in about 3 months (around 03/12/2019) for GERD  AND in 2-4 weeks as needed.Wilhelmina Mcardle, DNP, AGPCNP-BC Adult Gerontology Primary Care Nurse Practitioner [facility] 12/10/2018, 10:11 AM

## 2018-12-16 ENCOUNTER — Ambulatory Visit: Payer: Medicare HMO | Admitting: Nurse Practitioner

## 2018-12-29 ENCOUNTER — Ambulatory Visit: Payer: Medicare HMO | Admitting: Family Medicine

## 2019-04-14 IMAGING — US US CAROTID DUPLEX BILAT
1 series · 13 of 24 positions shown · non-contrast
Comparison: Prior duplex carotid ultrasound

CLINICAL DATA: 76-year-old female with bilateral carotid
atherosclerosis

EXAM:
BILATERAL CAROTID DUPLEX ULTRASOUND
TECHNIQUE: Gray scale imaging, color Doppler and duplex ultrasound were
performed of bilateral carotid and vertebral arteries in the neck.

[Series 1: us carotid duplex bilat · 13 of 65 slices shown]
[im 1/65]
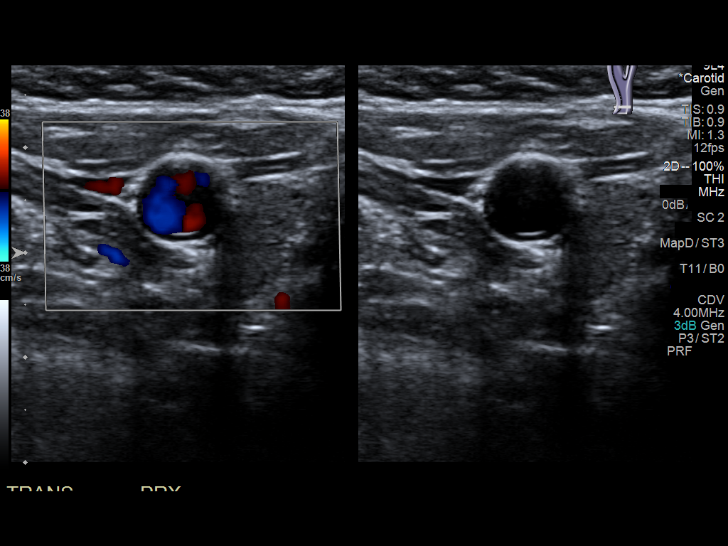
[im 6/65]
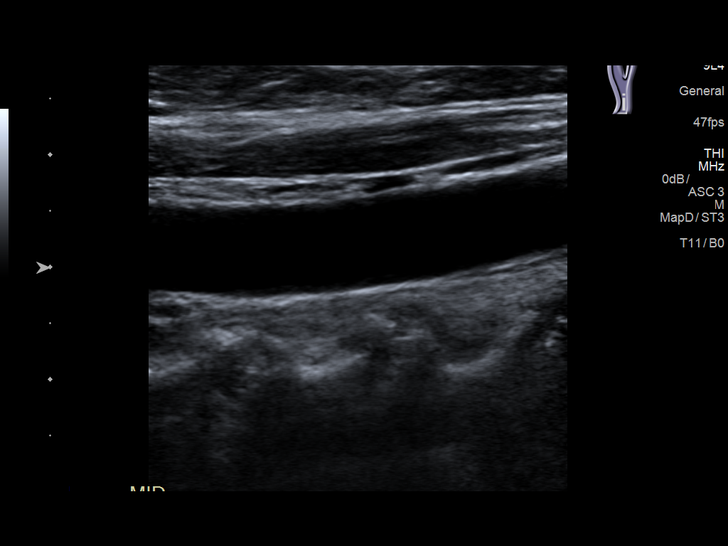
[im 12/65]
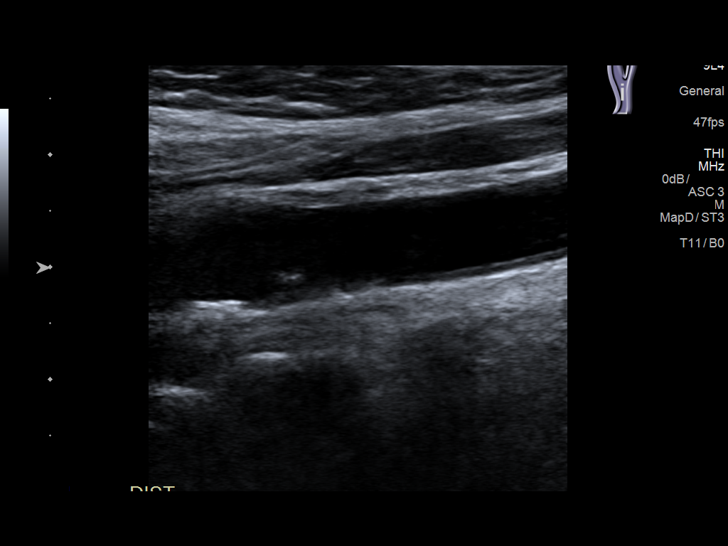
[im 17/65]
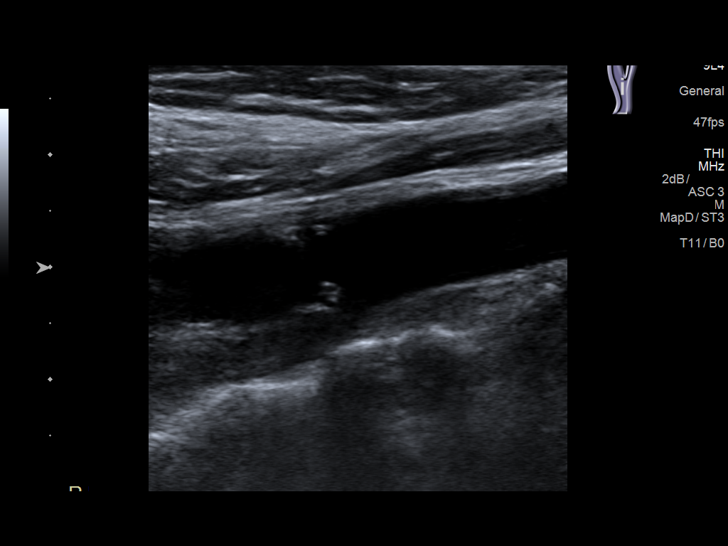
[im 23/65]
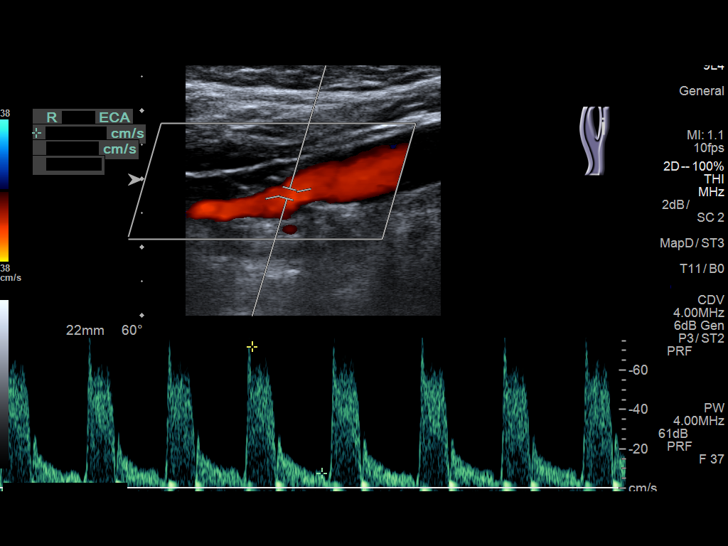
[im 28/65]
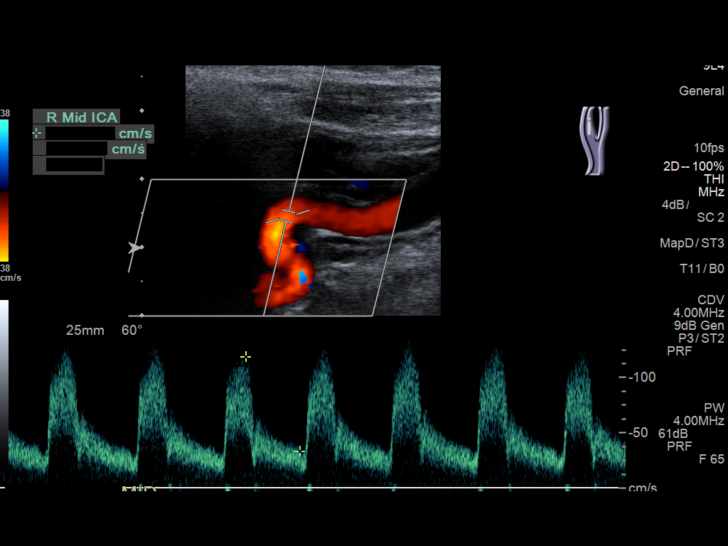
[im 34/65]
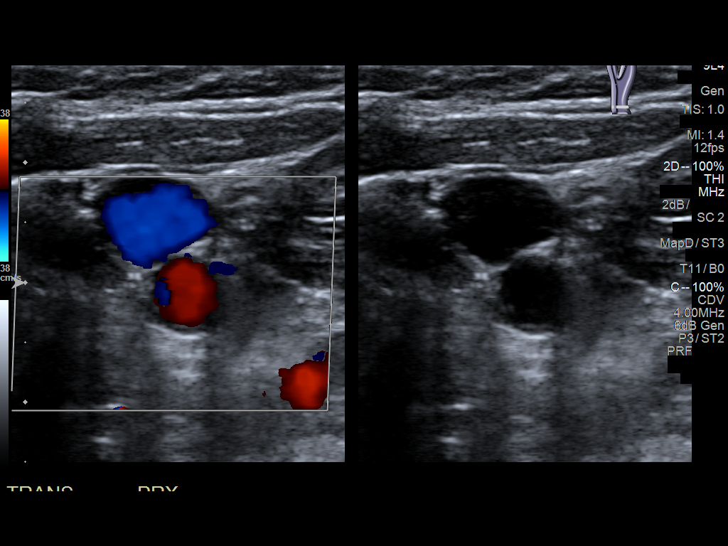
[im 37/65]
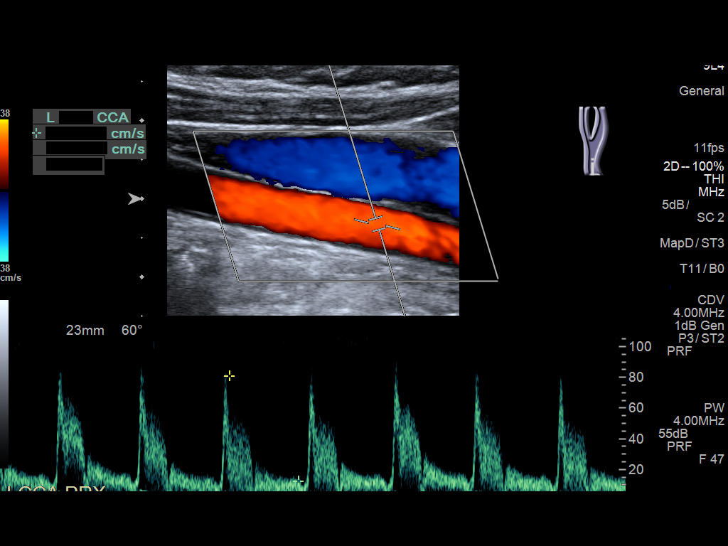
[im 42/65]
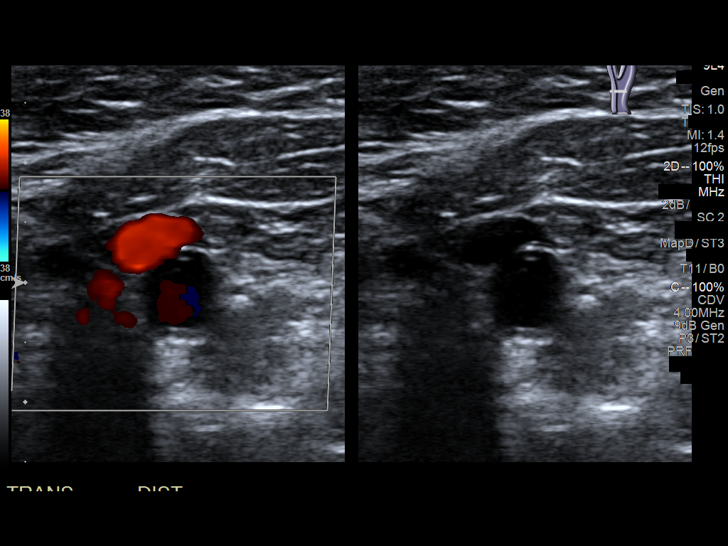
[im 48/65]
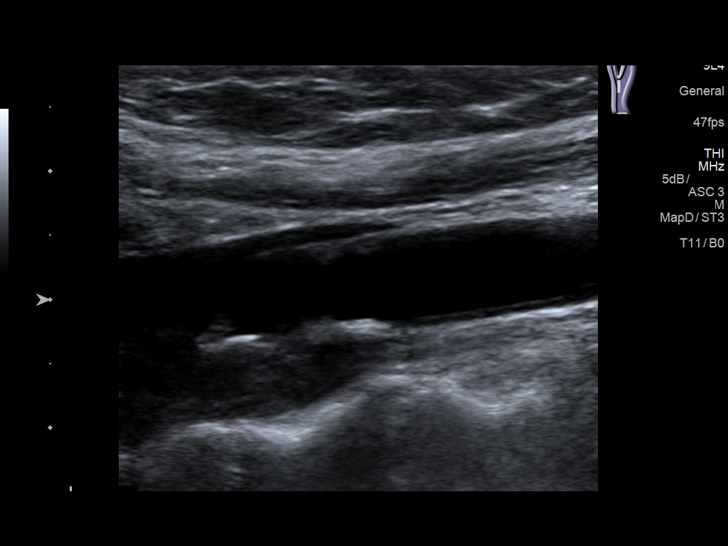
[im 53/65]
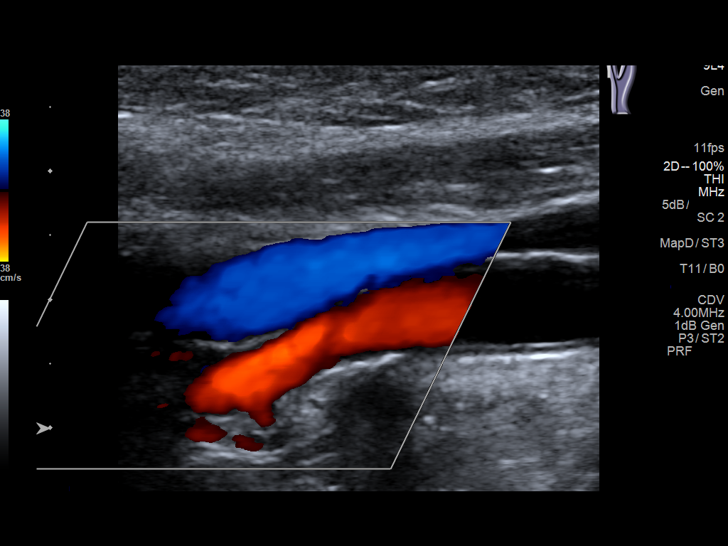
[im 59/65]
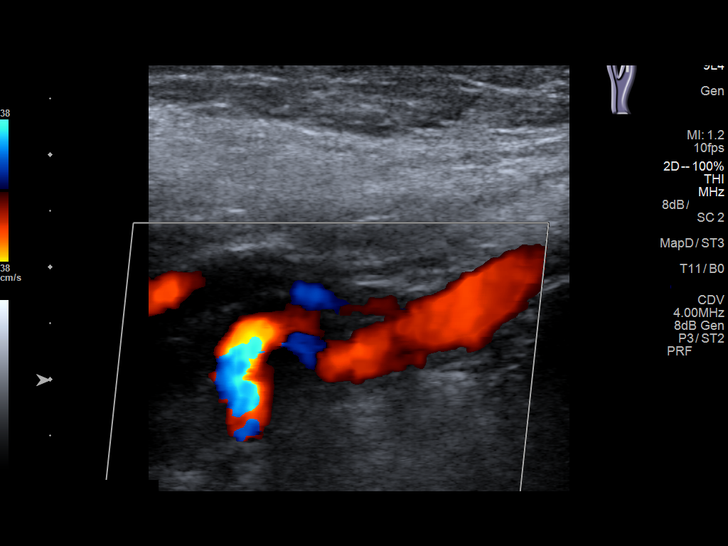
[im 65/65]
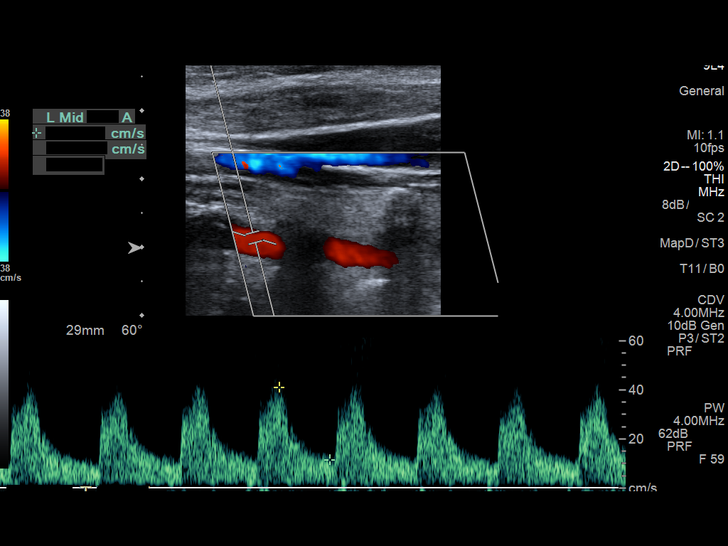

[13 of 24 positions shown; findings below may reference images not displayed]

FINDINGS: Criteria: Quantification of carotid stenosis is based on velocity
parameters that correlate the residual internal carotid diameter
with NASCET-based stenosis levels, using the diameter of the distal
internal carotid lumen as the denominator for stenosis measurement.

The following velocity measurements were obtained:

RIGHT
ICA: 146/26 cm/sec
CCA: 90/16 cm/sec

SYSTOLIC ICA/CCA RATIO:

ECA:  72 cm/sec

LEFT

ICA: 89/25 cm/sec

CCA: 81/13 cm/sec

SYSTOLIC ICA/CCA RATIO:

ECA:  65 cm/sec

RIGHT CAROTID ARTERY: Trace heterogeneous atherosclerotic plaque in
the proximal internal carotid artery. By peak systolic velocity
criteria in the region of plaque, the estimated stenosis remains
less than 50%. The remainder the internal carotid artery is highly
tortuous resulting in spurious elevation of the peak systolic
velocity.

RIGHT VERTEBRAL ARTERY:  Patent with normal antegrade flow.

LEFT CAROTID ARTERY: Trace heterogeneous atherosclerotic plaque in
the proximal internal carotid artery. By peak systolic velocity
criteria, the estimated stenosis is less than 50%.

LEFT VERTEBRAL ARTERY:  Patent with normal antegrade flow.
IMPRESSION: 1. Mild (1-49%) stenosis proximal right internal carotid artery
secondary to trace heterogeneous atherosclerotic plaque.
2. Mild (1-49%) stenosis proximal left internal carotid artery
secondary to trace heterogeneous atherosclerotic plaque.
3. Vertebral arteries remain patent with normal antegrade flow.

## 2019-06-21 ENCOUNTER — Other Ambulatory Visit: Payer: Self-pay | Admitting: Ophthalmology

## 2019-06-21 DIAGNOSIS — R519 Headache, unspecified: Secondary | ICD-10-CM

## 2019-07-01 ENCOUNTER — Ambulatory Visit
Admission: RE | Admit: 2019-07-01 | Discharge: 2019-07-01 | Disposition: A | Discharge status: Home / Self Care | Payer: Medicare HMO | Source: Ambulatory Visit | Attending: Ophthalmology | Admitting: Ophthalmology

## 2019-07-01 ENCOUNTER — Other Ambulatory Visit: Payer: Self-pay

## 2019-07-01 DIAGNOSIS — R519 Headache, unspecified: Secondary | ICD-10-CM | POA: Diagnosis present

## 2019-07-01 MED ORDER — GADOBUTROL 1 MMOL/ML IV SOLN
6.0000 mL | Freq: Once | INTRAVENOUS | Status: AC | PRN
  Administered 2019-07-01: 6 mL via INTRAVENOUS

## 2020-06-20 DIAGNOSIS — S76012A Strain of muscle, fascia and tendon of left hip, initial encounter: Secondary | ICD-10-CM | POA: Insufficient documentation

## 2020-08-27 ENCOUNTER — Other Ambulatory Visit: Payer: Self-pay | Admitting: Family Medicine

## 2020-08-27 DIAGNOSIS — R140 Abdominal distension (gaseous): Secondary | ICD-10-CM

## 2020-09-11 ENCOUNTER — Other Ambulatory Visit: Payer: Self-pay

## 2020-09-11 ENCOUNTER — Encounter: Payer: Self-pay | Admitting: Gastroenterology

## 2020-09-11 ENCOUNTER — Ambulatory Visit: Payer: Medicare HMO | Admitting: Gastroenterology

## 2020-09-11 VITALS — BP 119/72 | HR 83 | Ht 59.5 in | Wt 128.4 lb

## 2020-09-11 DIAGNOSIS — R61 Generalized hyperhidrosis: Secondary | ICD-10-CM | POA: Diagnosis not present

## 2020-09-11 DIAGNOSIS — R140 Abdominal distension (gaseous): Secondary | ICD-10-CM

## 2020-09-11 DIAGNOSIS — R634 Abnormal weight loss: Secondary | ICD-10-CM | POA: Diagnosis not present

## 2020-09-11 DIAGNOSIS — K5900 Constipation, unspecified: Secondary | ICD-10-CM

## 2020-09-11 NOTE — Progress Notes (Signed)
[name] MD, MRCP(U.K) [address]  [address]  [place], [place] [address]  Main: [phone]  Fax: [phone]   Gastroenterology Consultation  Referring Provider:     Care, [place] [facility] Primary Care Physician:  Care, [place] [facility] Primary Gastroenterologist:  Dr. [name]  Reason for Consultation:     Bloating         HPI:   Yvonne Espinoza is a 80 y.o. y/o female referred for bloating .   She was seen at my office back in 01/2017 for dysphagia .11/04/16- EGD-Performed dilation of a schatzkis ring to 18 mm  With features of reflux seen on biopsies of the esophagus.   08/24/2020: CMP- normal, CRP 35,   She states that since Easter she has had abdominal bloating associated with constipation.  Longstanding.  She takes MiraLAX has a good bowel movement and the bloating improves.  She also suffers from night sweats for a few weeks wakes up drenched in sweats.  Lost 10 pounds of weight over the last few weeks.  Denies any dysphagia.  History of abdominal surgery.  No history suggestive of diverticulitis with abscess and ended up having a colostomy bag and long duration of antibiotics afterwards.  He said part of her colon has been taken out.  She had a colonoscopy before the surgery.   Past Medical History:  Diagnosis Date  . Abscess    colon  . Allergy   . Arthritis    RA, degenerative disc disease  . Collagen vascular disease (HCC)    Rhematoid Arthritis  . Colovesical fistula    2016  . Degenerative cervical spinal stenosis 09/08/2016  . Diverticulitis   . DNAR (do not attempt resuscitation) 08/14/2016   Discussed in person August 14, 2016  . Dysphagia   . GERD (gastroesophageal reflux disease)   . History of hiatal hernia   . Hyperlipidemia   . Hypertension   . Kidney cysts   . Lung nodule < 6cm on CT 09/08/2016  . Radiculopathy   . Reflux esophagitis   . SBO (small bowel obstruction) (HCC) 04/25/2015   partial   . Schatzki's ring 2018   has been  stretched several times  . Spinal stenosis   . Stroke [facility]) 2019   TIA 2009, told dr. dew that she has had them as recent as last year  . Thoracic aortic aneurysm (HCC)   . Thoracic aortic atherosclerosis (HCC) 09/08/2016  . TIA (transient ischemic attack)    2209,2010  . 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 Wears glasses     Past Surgical History:  Procedure Laterality Date  . CARDIOVASCULAR STRESS TEST  08/08/11 and 06/07/10  . CATARACT EXTRACTION Right   . CATARACT EXTRACTION W/PHACO Left 07/30/2017   Procedure: CATARACT EXTRACTION PHACO AND INTRAOCULAR LENS PLACEMENT (IOC);  Surgeon: Nevada Crane, MD;  Location: [facility] ORS;  Service: Ophthalmology;  Laterality: Left;  [place] 00:21.4 AP% 12.1 CDE 2.59 Fluid Pack Lot # [id number]  . COLOSTOMY REVERSAL  09/26/2014   dr wyatt  . COLOSTOMY REVISION N/A 06/26/2014   Procedure: SIGMOID COLECTOMY WITH BLADDER REPAIR;  Surgeon: Frederik Schmidt, MD;  Location: [facility] OR;  Service: General;  Laterality: N/A;  . CYSTOSCOPY W/ URETERAL STENT PLACEMENT Left 06/26/2014   Procedure: BOARI FLAP LEFT URETERAL REIMPLANT;  Surgeon: Chelsea Aus, MD;  Location: [facility] OR;  Service: Urology;  Laterality: Left;  . DILATION AND CURETTAGE OF UTERUS    . ESOPHAGEAL DILATION  10/10/08  .  esophageal stretch  2018   ring stretched  . ESOPHAGOGASTRODUODENOSCOPY (EGD) WITH PROPOFOL N/A 11/04/2016   Procedure: ESOPHAGOGASTRODUODENOSCOPY (EGD) WITH PROPOFOL WITH DILATION;  Surgeon: [name], MD;  Location: [facility] ENDOSCOPY;  Service: Endoscopy;  Laterality: N/A;  . FLEXIBLE SIGMOIDOSCOPY N/A 09/26/2014   Procedure: RIGID SIGMOIDOSCOPY;  Surgeon: Jimmye Norman, MD;  Location: [facility] OR;  Service: General;  Laterality: N/A;  . ILEOSTOMY N/A 06/26/2014   Procedure: LOOP ILEOSTOMY;  Surgeon: Frederik Schmidt, MD;  Location: [facility] OR;  Service: General;  Laterality: N/A;  . INSERTION OF ANTERIOR SEGMENT AQUEOUS DRAINAGE DEVICE (ISTENT) Left 07/30/2017   Procedure: INSERTION OF ANTERIOR  SEGMENT AQUEOUS DRAINAGE DEVICE (ISTENT);  Surgeon: Nevada Crane, MD;  Location: [facility] ORS;  Service: Ophthalmology;  Laterality: Left;  . KNEE ARTHROSCOPY Bilateral   . SHOULDER SURGERY     right 2010  . TRANSVERSE LOOP COLOSTOMY N/A 09/26/2014   Procedure: LOOP COLOSTOMY REVERESAL;  Surgeon: Jimmye Norman, MD;  Location: [facility] OR;  Service: General;  Laterality: N/A;    Prior to Admission medications   Medication Sig Start Date End Date Taking? Authorizing Provider  aspirin EC 325 MG tablet Take 325 mg by mouth daily.    [provider]  Coenzyme Q10 (CO Q 10) 100 MG CAPS Take 100 mg by mouth daily.     [provider]  FIBER ADULT GUMMIES PO Take 5 g by mouth 2 (two) times daily.    [provider]  fluticasone (FLONASE) 50 MCG/ACT nasal spray Place 1 spray into both nostrils daily as needed for allergies. 06/30/18   Kerman Passey, MD  folic acid (FOLVITE) 400 MCG tablet Take 400 mcg by mouth daily.     [provider]  ketotifen (ZADITOR) 0.025 % ophthalmic solution 1 drop 2 (two) times daily.    [provider]  Lutein 20 MG CAPS Take 40 mg by mouth daily.    [provider]  Magnesium 250 MG TABS Take 250 mg by mouth daily.     [provider]  Multiple Vitamin (MULTIVITAMIN WITH MINERALS) TABS tablet Take 1 tablet by mouth daily.    [provider]  Multiple Vitamins-Minerals (PRESERVISION AREDS 2 PO) Take 1 tablet by mouth daily.    [provider]  Polyethyl Glycol-Propyl Glycol (SYSTANE ULTRA OP) Place 1 drop into both eyes 2 (two) times daily as needed (for dry eyes).     [provider]  triamcinolone cream (KENALOG) 0.1 % Apply 1 application topically 2 (two) times daily. Mix one to one with gentle moisturizing lotion; as needed 08/14/16   Kerman Passey, MD    Family History  Problem Relation Age of Onset  . Diabetes Mother        pre dm  . Diabetes Father   . Heart attack Father   .  Heart disease Father   . Diabetes Sister   . Stroke Sister   . Glaucoma Brother   . Diabetes Brother   . Diabetes Sister   . Hypertension Son   . Hyperlipidemia Son   . Diabetes Son   . Stroke Maternal Aunt   . Diabetes Maternal Grandfather   . Cancer Neg Hx   . COPD Neg Hx   . Breast cancer Neg Hx      Social History   Tobacco Use  . Smoking status: Never Smoker  . Smokeless tobacco: Never Used  Vaping Use  . Vaping Use: Never used  Substance Use Topics  .  Alcohol use: Yes    Comment: "rarely"  . Drug use: No    Allergies as of 09/11/2020 - Review Complete 12/10/2018  Allergen Reaction Noted  . Gabapentin Other (See Comments) 05/01/2014  . Prednisone Other (See Comments) 05/01/2014  . Statins Swelling 04/20/2015  . Neosporin [neomycin-bacitracin zn-polymyx] Other (See Comments) 05/01/2014  . Pollen extract  12/10/2018  . Methotrexate derivatives Other (See Comments) 09/30/2016    Review of Systems:    All systems reviewed and negative except where noted in HPI.   Physical Exam:  There were no vitals taken for this visit. No LMP recorded. Patient is postmenopausal. Psych:  Alert and cooperative. Normal mood and affect. General:   Alert,  Well-developed, well-nourished, pleasant and cooperative in NAD Head:  Normocephalic and atraumatic. Eyes:  Sclera clear, no icterus.   Conjunctiva pink. Ears:  Normal auditory acuity. Lungs:  Respirations even and unlabored.  Clear throughout to auscultation.   No wheezes, crackles, or rhonchi. No acute distress. Heart:  Regular rate and rhythm; no murmurs, clicks, rubs, or gallops. Abdomen:  Normal bowel sounds.  No bruits.  Soft, non-tender and non-distended without masses, hepatosplenomegaly or hernias noted.  No guarding or rebound tenderness.    Neurologic:  Alert and oriented x3;  grossly normal neurologically. Psych:  Alert and cooperative. Normal mood and affect.  Imaging Studies: No results found.  Assessment and  Plan:   Yvonne Espinoza is a 80 y.o. y/o female has been referred for bloating ongoing since yesterday.  Associated with weight loss over 10 pounds and night sweats.  Noted elevated CRP of 35 recently was not repeated subsequently.  No recent CBC available.  Her bloating does get better with a bowel movement, although this could be related to simple constipation I would like to obtain a CAT scan to rule out any neoplasm/lymphoma with the elevated CRP as well as weight loss.  Plan 1.  Commence on daily MiraLAX for constipation and bloating 2.  CT scan abdomen pelvis with contrast 3.  Check CBC, CMP, CRP, TSH 4.  If no better at next visit we will consider further evaluation with endoscopy  Follow up in 2 weeks  Dr [name] MD,MRCP(U.K)

## 2020-09-11 NOTE — Patient Instructions (Signed)
You have been schedule for a CT scan on May 31st at 8am. Please arrive at [facility] medical mall entrance at 7:30. Report to radiology dept. Nothing to eat or drink 4 hours prior to scan. You will need to pick up contract from the medical mall of [facility] a few days before CT scan.

## 2020-09-12 LAB — COMPREHENSIVE METABOLIC PANEL
ALT: 19 IU/L (ref 0–32)
AST: 17 IU/L (ref 0–40)
Albumin/Globulin Ratio: 1.5 (ref 1.2–2.2)
Albumin: 3.7 g/dL (ref 3.7–4.7)
Alkaline Phosphatase: 120 IU/L (ref 44–121)
BUN/Creatinine Ratio: 14 (ref 12–28)
BUN: 14 mg/dL (ref 8–27)
Bilirubin Total: <0.2 mg/dL (ref 0.0–1.2)
CO2: 26 mmol/L (ref 20–29)
Calcium: 9.3 mg/dL (ref 8.7–10.3)
Chloride: 102 mmol/L (ref 96–106)
Creatinine, Ser: 0.97 mg/dL (ref 0.57–1.00)
Globulin, Total: 2.5 g/dL (ref 1.5–4.5)
Glucose: 147 mg/dL — ABNORMAL HIGH (ref 65–99)
Potassium: 4.3 mmol/L (ref 3.5–5.2)
Sodium: 142 mmol/L (ref 134–144)
Total Protein: 6.2 g/dL (ref 6.0–8.5)
eGFR: 59 mL/min/{1.73_m2} — ABNORMAL LOW (ref >59)

## 2020-09-12 LAB — CBC WITH DIFFERENTIAL/PLATELET
Basophils Absolute: 0.1 10*3/uL (ref 0.0–0.2)
Basos: 1 %
EOS (ABSOLUTE): 0.4 10*3/uL (ref 0.0–0.4)
Eos: 5 %
Hematocrit: 39.9 % (ref 34.0–46.6)
Hemoglobin: 13.2 g/dL (ref 11.1–15.9)
Immature Grans (Abs): 0 10*3/uL (ref 0.0–0.1)
Immature Granulocytes: 0 %
Lymphocytes Absolute: 1.3 10*3/uL (ref 0.7–3.1)
Lymphs: 17 %
MCH: 29.8 pg (ref 26.6–33.0)
MCHC: 33.1 g/dL (ref 31.5–35.7)
MCV: 90 fL (ref 79–97)
Monocytes Absolute: 0.8 10*3/uL (ref 0.1–0.9)
Monocytes: 11 %
Neutrophils Absolute: 5.1 10*3/uL (ref 1.4–7.0)
Neutrophils: 66 %
Platelets: 349 10*3/uL (ref 150–450)
RBC: 4.43 x10E6/uL (ref 3.77–5.28)
RDW: 13.5 % (ref 11.7–15.4)
WBC: 7.5 10*3/uL (ref 3.4–10.8)

## 2020-09-12 LAB — TSH: TSH: 1.88 u[IU]/mL (ref 0.450–4.500)

## 2020-09-12 LAB — C-REACTIVE PROTEIN: CRP: 7 mg/L (ref 0–10)

## 2020-10-02 ENCOUNTER — Ambulatory Visit
Admission: RE | Admit: 2020-10-02 | Discharge: 2020-10-02 | Disposition: A | Discharge status: Home / Self Care | Payer: Medicare HMO | Source: Ambulatory Visit | Attending: Gastroenterology | Admitting: Gastroenterology

## 2020-10-02 ENCOUNTER — Other Ambulatory Visit: Payer: Self-pay

## 2020-10-02 DIAGNOSIS — R634 Abnormal weight loss: Secondary | ICD-10-CM | POA: Insufficient documentation

## 2020-10-02 MED ORDER — IOHEXOL 300 MG/ML  SOLN
100.0000 mL | Freq: Once | INTRAMUSCULAR | Status: AC | PRN
  Administered 2020-10-02: 100 mL via INTRAVENOUS

## 2020-10-02 MED ORDER — IOHEXOL 350 MG/ML SOLN: 100.0000 mL | Freq: Once | INTRAVENOUS | Status: DC | PRN

## 2020-10-04 ENCOUNTER — Other Ambulatory Visit: Payer: Self-pay

## 2020-10-15 ENCOUNTER — Ambulatory Visit: Payer: Medicare HMO | Admitting: Gastroenterology

## 2020-10-15 ENCOUNTER — Encounter: Payer: Self-pay | Admitting: Gastroenterology

## 2020-10-15 ENCOUNTER — Other Ambulatory Visit: Payer: Self-pay

## 2020-10-15 VITALS — BP 126/71 | HR 75 | Temp 98.1°F | Ht 59.5 in | Wt 128.4 lb

## 2020-10-15 DIAGNOSIS — R1310 Dysphagia, unspecified: Secondary | ICD-10-CM

## 2020-10-15 DIAGNOSIS — K581 Irritable bowel syndrome with constipation: Secondary | ICD-10-CM | POA: Diagnosis not present

## 2020-10-15 NOTE — Progress Notes (Signed)
[name] MD, MRCP(U.K) [address]  [address]  [place], [place] [address]  Main: [phone]  Fax: [phone]   Primary Care Physician: Care, [place] Primary  Primary Gastroenterologist:  Dr. [name]   Here to follow up for bloating .    HPI: Yvonne Espinoza is a 80 y.o. female  Summary of history :   Seen last at my office on 09/11/2020.    11/04/16- EGD-Performed dilation of a schatzkis ring to 18 mm  With features of reflux seen on biopsies of the esophagus.   She states that since Easter she has had abdominal bloating associated with constipation.  Longstanding.  She takes MiraLAX has a good bowel movement and the bloating improves.  She also suffers from night sweats for a few weeks wakes up drenched in sweats.  Lost 10 pounds of weight over the last few weeks.  Denies any dysphagia.  History of abdominal surgery.  No history suggestive of diverticulitis with abscess and ended up having a colostomy bag and long duration of antibiotics afterwards.  She said part of her colon has been taken out.  She had a colonoscopy before the surgery.   Interval history  09/11/2020- 10/15/2020  10/02/2020: Large colonic stool burden  09/11/2020:  CRP, CMP,TSH,  CBC- normal  Since her last visit no further weight loss over the past 4 weeks.  Taking MiraLAX as needed for constipation.  When she does not have a good bowel movement has abdominal discomfort.  She also complains of dysphagia going on for a month for solids more than liquids feels like pills get stuck in her throat.  Current Outpatient Medications  Medication Sig Dispense Refill   aspirin 81 MG EC tablet Take 1 tablet by mouth daily.     aspirin EC 325 MG tablet Take 81 mg by mouth daily.     Cholecalciferol 25 MCG (1000 UT) tablet Take by mouth.     Coenzyme Q10 (CO Q 10) 100 MG CAPS Take 100 mg by mouth daily.      FIBER ADULT GUMMIES PO Take 5 g by mouth 2 (two) times daily.     fluticasone (FLONASE) 50  MCG/ACT nasal spray Place 1 spray into both nostrils daily as needed for allergies. 16 g 12   folic acid (FOLVITE) 400 MCG tablet Take 400 mcg by mouth daily.      ketotifen (ZADITOR) 0.025 % ophthalmic solution 1 drop 2 (two) times daily.     Lutein 20 MG CAPS Take 40 mg by mouth daily.     Magnesium 250 MG TABS Take 250 mg by mouth daily.      Multiple Vitamin (MULTIVITAMIN WITH MINERALS) TABS tablet Take 1 tablet by mouth daily.     Multiple Vitamins-Minerals (PRESERVISION AREDS 2 PO) Take 1 tablet by mouth daily.     Polyethyl Glycol-Propyl Glycol (SYSTANE ULTRA OP) Place 1 drop into both eyes 2 (two) times daily as needed (for dry eyes).      triamcinolone cream (KENALOG) 0.1 % Apply 1 application topically 2 (two) times daily. Mix one to one with gentle moisturizing lotion; as needed 45 g 1   No current facility-administered medications for this visit.    Allergies as of 10/15/2020 - Review Complete 10/15/2020  Allergen Reaction Noted   Gabapentin Other (See Comments) 05/01/2014   Prednisone Other (See Comments) 05/01/2014   Statins Swelling 04/20/2015   Bee pollen  12/10/2018   Neomycin-bacitracin-polymyxin [bacitracin-neomycin-polymyxin] Other (See Comments) 05/01/2014  Neosporin [neomycin-bacitracin zn-polymyx] Other (See Comments) 05/01/2014   Other  06/27/2019   Pollen extract  12/10/2018   Methotrexate derivatives Other (See Comments) 09/30/2016    ROS:  General: Negative for anorexia, weight loss, fever, chills, fatigue, weakness. ENT: Negative for hoarseness, difficulty swallowing , nasal congestion. CV: Negative for chest pain, angina, palpitations, dyspnea on exertion, peripheral edema.  Respiratory: Negative for dyspnea at rest, dyspnea on exertion, cough, sputum, wheezing.  GI: See history of present illness. GU:  Negative for dysuria, hematuria, urinary incontinence, urinary frequency, nocturnal urination.  Endo: Negative for unusual weight change.    Physical  Examination:   BP 126/71 (BP Location: Right Arm, Cuff Size: Normal)   Pulse 75   Temp 98.1 F (36.7 C) (Oral)   Ht 4' 11.5" (1.511 m)   Wt 128 lb 6.4 oz (58.2 kg)   BMI 25.50 kg/m   General: Well-nourished, well-developed in no acute distress.  Eyes: No icterus. Conjunctivae pink. Mouth: Oropharyngeal mucosa moist and pink , no lesions erythema or exudate. Neuro: Alert and oriented x 3.  Grossly intact. Skin: Warm and dry, no jaundice.   Psych: Alert and cooperative, normal mood and affect.   Imaging Studies: CT Abdomen Pelvis W Contrast  Result Date: 10/02/2020 CLINICAL DATA:  Constipation. Unintended weight loss. History of colon resection for diverticulitis 6 years ago. Intermittent left lower quadrant abdominal pain. EXAM: CT ABDOMEN AND PELVIS WITH CONTRAST TECHNIQUE: Multidetector CT imaging of the abdomen and pelvis was performed using the standard protocol following bolus administration of intravenous contrast. CONTRAST:  OMNIPAQUE IOHEXOL 300 MG/ML  SOLN COMPARISON:  04/25/2015 FINDINGS: Lower chest: Limited visualization of the lower thorax demonstrates minimal bibasilar heterogeneous opacities. Punctate (3 mm) right lower lobe pulmonary nodule is unchanged compared to the 2016 examination and thus of benign etiology. Normal heart size.  Calcifications within the aortic annulus. Hepatobiliary: Normal hepatic contour. Subcentimeter hypoattenuating lesion within the dome of the medial segment the left lobe of the liver (image 9), is too small to adequately characterize though unchanged compared to the 2016 examination and thus favored to represent a hepatic cyst. No new discrete hepatic lesions. Normal appearance of the gallbladder given degree distention. No radiopaque gallstones. No intra or extrahepatic biliary duct dilatation. No ascites. Pancreas: Normal appearance of the pancreas. Spleen: Multiple punctate granuloma are again seen within otherwise normal-appearing spleen.  Adrenals/Urinary Tract: There is symmetric enhancement and excretion of the bilateral kidneys. Multiple parapelvic cysts are again seen bilaterally. No discrete worrisome renal lesions. No urinary obstruction or perinephric stranding. Normal appearance of the bilateral adrenal glands. Normal appearance of the urinary bladder given degree of distention. Stomach/Bowel: Postoperative change of sigmoid colon. Large colonic stool burden without evidence of enteric obstruction as enteric contrast extends to the level of the rectum. Normal appearance of the terminal ileum and the retrocecal appendix (coronal image 51, series 5). No discrete areas of bowel wall thickening. No pneumoperitoneum, pneumatosis or portal venous gas. Vascular/Lymphatic: Moderate to large amount of eccentric mixed calcified and noncalcified atherosclerotic plaque within a normal caliber abdominal aorta. The major branch vessels of the abdominal aorta appear patent on this non CTA examination. No bulky retroperitoneal, mesenteric, pelvic or inguinal lymph adenopathy. Reproductive: Normal appearance of the pelvic organs for age. No discrete adnexal lesion. No free fluid within the pelvic cul-de-sac. Other: Symmetric moderately dense breast tissue is seen bilaterally. Left-sided gluteal calcifications. Well-healed midline abdominal incision without evidence of hernia. Musculoskeletal: No acute or aggressive osseous abnormalities. Mild straightening  expected lumbar lordosis. Mild to moderate multilevel lumbar spine DDD, worse at L2-L3 with disc space height loss, endplate irregularity and small posteriorly directed disc osteophyte complex at this location. Moderate degenerative change of the bilateral hips with joint space loss, subchondral sclerosis and osteophytosis. Note is made of a small right-sided os acetabuli. IMPRESSION: 1. No explanation for patient's unintentional weight loss. Specifically, no evidence intra-abdominal or pelvic malignancy.  2. Postoperative change of the sigmoid colon. 3. Large colonic stool burden without evidence of enteric obstruction. 4.  Aortic Atherosclerosis (ICD10-I70.0). Electronically Signed   By: Simonne Come M.D.   On: 10/02/2020 09:26    Assessment and Plan:   Jasmon Mattice Reaser is a 80 y.o. 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  Previously had some weight loss but has had none over the last 5 weeks since she started seeing [place].  We will keep a close watch on it.  Having some dysphagia for 1 month.   Plan 1.  High-fiber diet 2.  Stop MiraLAX and commenced on Linzess 145 mcg daily.  2-week samples will be provided.  She has been advised if symptoms are no better and not having regular bowel movements we can increase the dose or if having diarrhea we can reduce the dose. 3.  Dysphagia we will obtain EGD  I have discussed alternative options, risks & benefits,  which include, but are not limited to, bleeding, infection, perforation,respiratory complication & drug reaction.  The patient agrees with this plan & written consent will be obtained.     Dr [name]  MD,MRCP [facility]) Follow up in 8 weeks

## 2020-10-16 ENCOUNTER — Ambulatory Visit: Payer: Medicare HMO | Admitting: Anesthesiology

## 2020-10-16 ENCOUNTER — Other Ambulatory Visit: Payer: Self-pay

## 2020-10-16 ENCOUNTER — Encounter
Admission: RE | Disposition: A | Discharge status: Home / Self Care | Payer: Self-pay | Source: Home / Self Care | Attending: Gastroenterology

## 2020-10-16 ENCOUNTER — Encounter: Payer: Self-pay | Admitting: Gastroenterology

## 2020-10-16 ENCOUNTER — Ambulatory Visit
Admission: RE | Admit: 2020-10-16 | Discharge: 2020-10-16 | Disposition: A | Discharge status: Home / Self Care | Payer: Medicare HMO | Attending: Gastroenterology | Admitting: Gastroenterology

## 2020-10-16 DIAGNOSIS — K222 Esophageal obstruction: Secondary | ICD-10-CM | POA: Diagnosis not present

## 2020-10-16 DIAGNOSIS — Z888 Allergy status to other drugs, medicaments and biological substances status: Secondary | ICD-10-CM | POA: Diagnosis not present

## 2020-10-16 DIAGNOSIS — R1310 Dysphagia, unspecified: Secondary | ICD-10-CM | POA: Diagnosis not present

## 2020-10-16 DIAGNOSIS — Z9049 Acquired absence of other specified parts of digestive tract: Secondary | ICD-10-CM | POA: Insufficient documentation

## 2020-10-16 DIAGNOSIS — Z91030 Bee allergy status: Secondary | ICD-10-CM | POA: Insufficient documentation

## 2020-10-16 DIAGNOSIS — Z7982 Long term (current) use of aspirin: Secondary | ICD-10-CM | POA: Insufficient documentation

## 2020-10-16 HISTORY — PX: ESOPHAGOGASTRODUODENOSCOPY (EGD) WITH PROPOFOL: SHX5813

## 2020-10-16 SURGERY — ESOPHAGOGASTRODUODENOSCOPY (EGD) WITH PROPOFOL
Anesthesia: General

## 2020-10-16 MED ORDER — LIDOCAINE HCL (CARDIAC) PF 100 MG/5ML IV SOSY
PREFILLED_SYRINGE | INTRAVENOUS | Status: DC | PRN
  Administered 2020-10-16: 40 mg via INTRAVENOUS

## 2020-10-16 MED ORDER — PROPOFOL 10 MG/ML IV BOLUS
INTRAVENOUS | Status: AC
  Filled 2020-10-16: qty 20

## 2020-10-16 MED ORDER — SODIUM CHLORIDE 0.9 % IV SOLN: INTRAVENOUS | Status: DC

## 2020-10-16 MED ORDER — LIDOCAINE HCL (PF) 1 % IJ SOLN
INTRAMUSCULAR | Status: AC
  Filled 2020-10-16: qty 2

## 2020-10-16 MED ORDER — PROPOFOL 10 MG/ML IV BOLUS
INTRAVENOUS | Status: DC | PRN
  Administered 2020-10-16: 20 mg via INTRAVENOUS
  Administered 2020-10-16: 50 mg via INTRAVENOUS

## 2020-10-16 MED ORDER — GLYCOPYRROLATE 0.2 MG/ML IJ SOLN
INTRAMUSCULAR | Status: AC
  Filled 2020-10-16: qty 1

## 2020-10-16 MED ORDER — OMEPRAZOLE MAGNESIUM 20 MG PO TBEC: 20.0000 mg | DELAYED_RELEASE_TABLET | Freq: Every day | ORAL | 1 refills | Status: DC

## 2020-10-16 NOTE — Transfer of Care (Signed)
Immediate Anesthesia Transfer of Care Note  Patient: Yvonne Espinoza  Procedure(s) Performed: ESOPHAGOGASTRODUODENOSCOPY (EGD) WITH PROPOFOL  Patient Location: PACU and Endoscopy Unit  Anesthesia Type:General  Level of Consciousness: awake, drowsy and patient cooperative  Airway & Oxygen Therapy: Patient Spontanous Breathing  Post-op Assessment: Report given to RN and Post -op Vital signs reviewed and stable  Post vital signs: Reviewed and stable  Last Vitals:  Vitals Value Taken Time  BP 123/58 10/16/20 1114  Temp    Pulse 78 10/16/20 1114  Resp 9 10/16/20 1114  SpO2 94 % 10/16/20 1114  Vitals shown include unvalidated device data.  Last Pain:  Vitals:   10/16/20 1012  TempSrc: Temporal  PainSc: 0-No pain         Complications: No notable events documented.

## 2020-10-16 NOTE — Anesthesia Postprocedure Evaluation (Signed)
Anesthesia Post Note  Patient: Yvonne Espinoza  Procedure(s) Performed: ESOPHAGOGASTRODUODENOSCOPY (EGD) WITH PROPOFOL  Patient location during evaluation: Endoscopy Anesthesia Type: General Level of consciousness: awake and alert Pain management: pain level controlled Vital Signs Assessment: post-procedure vital signs reviewed and stable Respiratory status: spontaneous breathing, nonlabored ventilation, respiratory function stable and patient connected to nasal cannula oxygen Cardiovascular status: blood pressure returned to baseline and stable Postop Assessment: no apparent nausea or vomiting Anesthetic complications: no   No notable events documented.   Last Vitals:  Vitals:   10/16/20 1122 10/16/20 1132  BP: (!) 117/57 137/61  Pulse: 61 (!) 59  Resp: 10 13  Temp:    SpO2: 99% 100%    Last Pain:  Vitals:   10/16/20 1132  TempSrc:   PainSc: 0-No pain                 Corinda Gubler

## 2020-10-16 NOTE — Anesthesia Preprocedure Evaluation (Signed)
Anesthesia Evaluation  Patient identified by MRN, date of birth, ID band Patient awake    Reviewed: Allergy & Precautions, NPO status , Patient's Chart, lab work & pertinent test results, Unable to perform ROS - Chart review only  History of Anesthesia Complications Negative for: history of anesthetic complications  Airway Mallampati: II  TM Distance: >3 FB Neck ROM: Full    Dental  (+) Dental Advisory Given, Upper Dentures   Pulmonary neg pulmonary ROS,    breath sounds clear to auscultation       Cardiovascular hypertension, Pt. on medications (-) angina+ Peripheral Vascular Disease  (-) CAD, (-) Past MI, (-) Cardiac Stents and (-) CABG (-) dysrhythmias (-) Valvular Problems/Murmurs Rhythm:Regular Rate:Normal     Neuro/Psych neg Seizures TIA Neuromuscular disease CVA, No Residual Symptoms negative psych ROS   GI/Hepatic Neg liver ROS, hiatal hernia, GERD  ,  Endo/Other  negative endocrine ROS  Renal/GU negative Renal ROS  negative genitourinary   Musculoskeletal  (+) Arthritis , Rheumatoid disorders,    Abdominal   Peds negative pediatric ROS (+)  Hematology negative hematology ROS (+)   Anesthesia Other Findings Past Medical History: No date: Abscess     Comment: colon No date: Arthritis     Comment: RA No date: Collagen vascular disease (HCC)     Comment: Rhematoid Arthritis No date: Colovesical fistula     Comment: 2016 09/08/2016: Degenerative cervical spinal stenosis No date: Diverticulitis 08/14/2016: DNAR (do not attempt resuscitation)     Comment: Discussed in person August 14, 2016 No date: GERD (gastroesophageal reflux disease) No date: Kidney cysts 09/08/2016: Lung nodule < 6cm on CT No date: Reflux esophagitis 04/25/2015: SBO (small bowel obstruction) (HCC)     Comment: partial  No date: Spinal stenosis No date: Stroke [facility])     Comment: TIA 2009 09/08/2016: Thoracic aortic atherosclerosis  (HCC) No date: TIA (transient ischemic attack)     Comment: 2209,2010 5/14: TMJ (temporomandibular joint disorder) No date: Wears glasses  Reproductive/Obstetrics negative OB ROS                             Anesthesia Physical  Anesthesia Plan  ASA: 3  Anesthesia Plan: General   Post-op Pain Management:    Induction: Intravenous  PONV Risk Score and Plan: 3 and Ondansetron, Propofol infusion and TIVA  Airway Management Planned: Nasal Cannula and Natural Airway  Additional Equipment: None  Intra-op Plan:   Post-operative Plan:   Informed Consent: I have reviewed the patients History and Physical, chart, labs and discussed the procedure including the risks, benefits and alternatives for the proposed anesthesia with the patient or authorized representative who has indicated his/her understanding and acceptance.     Dental advisory given  Plan Discussed with: CRNA and Anesthesiologist  Anesthesia Plan Comments: (Discussed risks of anesthesia with patient, including possibility of difficulty with spontaneous ventilation under anesthesia necessitating airway intervention, PONV, and rare risks such as cardiac or respiratory or neurological events. Patient understands.)        Anesthesia Quick Evaluation

## 2020-10-16 NOTE — H&P (Signed)
[name], MD [address], [address], [place], [place], [address] [address], [address], [place], [place], [address] Phone: [phone]  Fax: [phone]  Primary Care Physician:  [facility] [place] [facility]   Pre-Procedure History & Physical: HPI:  Yvonne Espinoza is a 80 y.o. female is here for an endoscopy    Past Medical History:  Diagnosis Date   Abscess    colon   Allergy    Arthritis    RA, degenerative disc disease   Collagen vascular disease (HCC)    Rhematoid Arthritis   Colovesical fistula    2016   Degenerative cervical spinal stenosis 09/08/2016   Diverticulitis    DNAR (do not attempt resuscitation) 08/14/2016   Discussed in person August 14, 2016   Dysphagia    GERD (gastroesophageal reflux disease)    History of hiatal hernia    Hyperlipidemia    Hypertension    Kidney cysts    Lung nodule < 6cm on CT 09/08/2016   Radiculopathy    Reflux esophagitis    SBO (small bowel obstruction) (HCC) 04/25/2015   partial    Schatzki's ring 2018   has been stretched several times   Spinal stenosis    Stroke (HCC) 2019   TIA 2009, told dr. 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   Wears glasses     Past Surgical History:  Procedure Laterality Date   CARDIOVASCULAR STRESS TEST  08/08/11 and 06/07/10   CATARACT EXTRACTION Right    CATARACT EXTRACTION W/PHACO Left 07/30/2017   Procedure: CATARACT EXTRACTION PHACO AND INTRAOCULAR LENS PLACEMENT (IOC);  Surgeon: Nevada Crane, MD;  Location: [facility] ORS;  Service: Ophthalmology;  Laterality: Left;  [place] 00:21.4 AP% 12.1 CDE 2.59 Fluid Pack Lot # [id number]   COLOSTOMY REVERSAL  09/26/2014   dr wyatt   COLOSTOMY REVISION N/A 06/26/2014   Procedure: SIGMOID COLECTOMY WITH BLADDER REPAIR;  Surgeon: Frederik Schmidt, MD;  Location: [facility] OR;   Service: General;  Laterality: N/A;   CYSTOSCOPY W/ URETERAL STENT PLACEMENT Left 06/26/2014   Procedure: BOARI FLAP LEFT URETERAL REIMPLANT;  Surgeon: Chelsea Aus, MD;  Location: [facility] OR;  Service: Urology;  Laterality: Left;   DILATION AND CURETTAGE OF UTERUS     ESOPHAGEAL DILATION  10/10/08   esophageal stretch  2018   ring stretched   ESOPHAGOGASTRODUODENOSCOPY (EGD) WITH PROPOFOL N/A 11/04/2016   Procedure: ESOPHAGOGASTRODUODENOSCOPY (EGD) WITH PROPOFOL WITH DILATION;  Surgeon: [name], MD;  Location: [facility] ENDOSCOPY;  Service: Endoscopy;  Laterality: N/A;   FLEXIBLE SIGMOIDOSCOPY N/A 09/26/2014   Procedure: RIGID SIGMOIDOSCOPY;  Surgeon: Jimmye Norman, MD;  Location: [facility] OR;  Service: General;  Laterality: N/A;   ILEOSTOMY N/A 06/26/2014   Procedure: LOOP ILEOSTOMY;  Surgeon: Frederik Schmidt, MD;  Location: [facility] OR;  Service: General;  Laterality: N/A;   INSERTION OF ANTERIOR SEGMENT AQUEOUS DRAINAGE DEVICE (ISTENT) Left 07/30/2017   Procedure: INSERTION OF ANTERIOR SEGMENT AQUEOUS DRAINAGE DEVICE (ISTENT);  Surgeon: Nevada Crane, MD;  Location: [facility] ORS;  Service: Ophthalmology;  Laterality: Left;   KNEE ARTHROSCOPY Bilateral    SHOULDER SURGERY     right 2010   TRANSVERSE LOOP COLOSTOMY N/A 09/26/2014   Procedure: LOOP COLOSTOMY REVERESAL;  Surgeon: Jimmye Norman, MD;  Location: [facility] OR;  Service:  General;  Laterality: N/A;    Prior to Admission medications   Medication Sig Start Date End Date Taking? Authorizing Provider  aspirin 81 MG EC tablet Take 1 tablet by mouth daily.   Yes [provider]  aspirin EC 81 MG tablet Take 81 mg by mouth daily. Swallow whole.   Yes [provider]  Cholecalciferol 25 MCG (1000 UT) tablet Take by mouth.   Yes [provider]  Coenzyme Q10 (CO Q 10) 100 MG CAPS Take 100 mg by mouth daily.    Yes [provider]  FIBER ADULT GUMMIES PO Take 5 g by mouth 2 (two) times daily.   Yes [provider]  fluticasone  (FLONASE) 50 MCG/ACT nasal spray Place 1 spray into both nostrils daily as needed for allergies. 06/30/18  Yes [name], [name], MD  folic acid (FOLVITE) 400 MCG tablet Take 400 mcg by mouth daily.    Yes [provider]  ketotifen (ZADITOR) 0.025 % ophthalmic solution 1 drop 2 (two) times daily.   Yes [provider]  Lutein 20 MG CAPS Take 40 mg by mouth daily.   Yes [provider]  Multiple Vitamin (MULTIVITAMIN WITH MINERALS) TABS tablet Take 1 tablet by mouth daily.   Yes [provider]  Multiple Vitamins-Minerals (PRESERVISION AREDS 2 PO) Take 1 tablet by mouth daily.   Yes [provider]  Polyethyl Glycol-Propyl Glycol (SYSTANE ULTRA OP) Place 1 drop into both eyes 2 (two) times daily as needed (for dry eyes).    Yes [provider]  triamcinolone cream (KENALOG) 0.1 % Apply 1 application topically 2 (two) times daily. Mix one to one with gentle moisturizing lotion; as needed 08/14/16  Yes [name], [name], MD  aspirin EC 325 MG tablet Take 81 mg by mouth daily.    [provider]  Magnesium 250 MG TABS Take 250 mg by mouth daily.     [provider]    Allergies as of 10/15/2020 - Review Complete 10/15/2020  Allergen Reaction Noted   Gabapentin Other (See Comments) 05/01/2014   Prednisone Other (See Comments) 05/01/2014   Statins Swelling 04/20/2015   Bee pollen  12/10/2018   Neomycin-bacitracin-polymyxin [bacitracin-neomycin-polymyxin] Other (See Comments) 05/01/2014   Neosporin [neomycin-bacitracin zn-polymyx] Other (See Comments) 05/01/2014   Other  06/27/2019   Pollen extract  12/10/2018   Methotrexate derivatives Other (See Comments) 09/30/2016    Family History  Problem Relation Age of Onset   Diabetes Mother        pre dm   Diabetes Father    Heart attack Father    Heart disease Father    Diabetes Sister    Stroke Sister    Glaucoma Brother    Diabetes Brother    Diabetes Sister    Hypertension  Son    Hyperlipidemia Son    Diabetes Son    Stroke Maternal Aunt    Diabetes Maternal Grandfather    Cancer Neg Hx    COPD Neg Hx    Breast cancer Neg Hx     Social History   Socioeconomic History   Marital status: Married    Spouse name: Not on file   Number of children: Not on file   Years of education: Not on file   Highest education level: Not on file  Occupational History   Not on file  Tobacco Use   Smoking status: Never   Smokeless tobacco: Never  Vaping Use   Vaping Use: Never used  Substance and Sexual Activity   Alcohol use: Not Currently    Comment: "rarely"   Drug use: No   Sexual activity: Yes  Other Topics Concern   Not on file  Social History Narrative   Not on file   Social Determinants of Health   Financial Resource Strain: Not on file  Food Insecurity: Not on file  Transportation Needs: Not on file  Physical Activity: Not on file  Stress: Not on file  Social Connections: Not on file  Intimate Partner Violence: Not on file    Review of Systems: See HPI, otherwise negative ROS  Physical Exam: BP (!) 154/64   Pulse (!) 55   Temp (!) 96.6 F (35.9 C) (Temporal)   Resp 20   Ht 5' (1.524 m)   Wt 58.1 kg   BMI 25.00 kg/m  General:   Alert,  pleasant and cooperative in NAD Head:  Normocephalic and atraumatic. Neck:  Supple; no masses or thyromegaly. Lungs:  Clear throughout to auscultation, normal respiratory effort.    Heart:  +S1, +S2, Regular rate and rhythm, No edema. Abdomen:  Soft, nontender and nondistended. Normal bowel sounds, without guarding, and without rebound.   Neurologic:  Alert and  oriented x4;  grossly normal neurologically.  Impression/Plan: Yvonne Espinoza is here for an endoscopy  to be performed for  evaluation of dysphagia    Risks, benefits, limitations, and alternatives regarding endoscopy have been reviewed with the patient.  Questions have been answered.  All parties agreeable.   [name], MD   10/16/2020, 10:52 AM

## 2020-10-16 NOTE — Anesthesia Procedure Notes (Signed)
Procedure Name: MAC Date/Time: 10/16/2020 11:00 AM Performed by: Jerrye Noble, CRNA Pre-anesthesia Checklist: Patient identified, Emergency Drugs available and Suction available Patient Re-evaluated:Patient Re-evaluated prior to induction Oxygen Delivery Method: Nasal cannula

## 2020-10-16 NOTE — Op Note (Signed)
[facility] Gastroenterology Patient Name: Yvonne Espinoza Procedure Date: 10/16/2020 10:53 AM MRN: [id number] Account #: [id number] Date of Birth: 12/28/1940 Admit Type: Outpatient Age: 80 Room: [facility] ENDO ROOM 2 Gender: Female Note Status: Finalized Procedure:             Upper GI endoscopy Indications:           Dysphagia Providers:             [name] MD, MD Medicines:             Monitored Anesthesia Care Complications:         No immediate complications. Procedure:             Pre-Anesthesia Assessment:                        - Prior to the procedure, a History and Physical was                         performed, and patient medications, allergies and                         sensitivities were reviewed. The patient's tolerance                         of previous anesthesia was reviewed.                        - The risks and benefits of the procedure and the                         sedation options and risks were discussed with the                         patient. All questions were answered and informed                         consent was obtained.                        - ASA Grade Assessment: II - A patient with mild                         systemic disease.                        After obtaining informed consent, the endoscope was                         passed under direct vision. Throughout the procedure,                         the patient's blood pressure, pulse, and oxygen                         saturations were monitored continuously. The Endoscope                         was introduced through the mouth, and advanced to the  third part of duodenum. The upper GI endoscopy was                         accomplished with ease. The patient tolerated the                         procedure well. Findings:      The examined duodenum was normal.      The stomach was normal.      The cardia and gastric fundus were normal on  retroflexion.      A non-obstructing Schatzki ring was found at the gastroesophageal       junction. A TTS dilator was passed through the scope. Dilation with a       15-16.5-18 mm balloon dilator was performed to 18 mm. The dilation site       was examined and showed moderate mucosal disruption.      The exam was otherwise without abnormality. Impression:            - Normal examined duodenum.                        - Normal stomach.                        - Non-obstructing Schatzki ring. Dilated.                        - The examination was otherwise normal.                        - No specimens collected. Recommendation:        - Discharge patient to home (with escort).                        - Resume previous diet.                        - Use Prilosec OTC 20 mg PO daily for 6 weeks. Procedure Code(s):     --- Professional ---                        [phone], Esophagogastroduodenoscopy, flexible,                         transoral; with transendoscopic balloon dilation of                         esophagus (less than 30 mm diameter) Diagnosis Code(s):     --- Professional ---                        K22.2, Esophageal obstruction                        R13.10, Dysphagia, unspecified CPT copyright 2019 American Medical Association. All rights reserved. The codes documented in this report are preliminary and upon coder review may  be revised to meet current compliance requirements. [name], MD [name] MD, MD 10/16/2020 11:10:00 AM This report has been signed electronically. Number of Addenda: 0 Note Initiated On: 10/16/2020 10:53 AM Estimated Blood Loss:  Estimated blood loss: none.  [facility]

## 2020-10-17 ENCOUNTER — Encounter: Payer: Self-pay | Admitting: Gastroenterology

## 2020-12-05 ENCOUNTER — Encounter: Payer: Self-pay | Admitting: Gastroenterology

## 2020-12-05 ENCOUNTER — Ambulatory Visit: Payer: Medicare HMO | Admitting: Gastroenterology

## 2020-12-05 ENCOUNTER — Other Ambulatory Visit: Payer: Self-pay

## 2020-12-05 VITALS — BP 127/71 | HR 98 | Temp 97.6°F | Ht 59.5 in | Wt 128.2 lb

## 2020-12-05 DIAGNOSIS — K581 Irritable bowel syndrome with constipation: Secondary | ICD-10-CM

## 2020-12-05 DIAGNOSIS — R1310 Dysphagia, unspecified: Secondary | ICD-10-CM | POA: Diagnosis not present

## 2020-12-05 NOTE — Progress Notes (Signed)
[name] MD, MRCP(U.K) [address]  [address]  [place], [place] [address]  Main: [phone]  Fax: [phone]   Primary Care Physician: [facility] [place] [facility]  Primary Gastroenterologist:  Dr. [name]    Complaint: Dysphagia follow-up   HPI: Yvonne Espinoza is a 80 y.o. female   Summary of history :     Seen last at my office on 10/15/2020     11/04/16- EGD-Performed dilation of a schatzkis ring to 18 mm  With features of reflux seen on biopsies of the esophagus.    History of chronic constipation/IBS.  Treated with MiraLAX.  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  She has had a colonoscopy she states at that point of time. 10/02/2020: CT scan of the abdomen pelvis with contrast large colonic stool burden 09/11/2020:  CRP, CMP,TSH,  CBC- normal    Interval history   10/15/2020-12/05/2020  10/16/2020: EGD: Schatzki's ring seen at was dilated to 18 mm Last visit the MiraLAX stopped and commenced on Linzess 145 mcg as MiraLAX was not working adequately. Weight stable at 128 pounds since last visit Since the endoscopy swallowing is better but she has not started taking her Prilosec as suggested and occasionally has a pill which she feels goes down slowly.  No issues with constipation.  Current Outpatient Medications  Medication Sig Dispense Refill   aspirin 81 MG EC tablet Take 1 tablet by mouth daily.     Cholecalciferol 25 MCG (1000 UT) tablet Take by mouth.     Coenzyme Q10 (CO Q 10) 100 MG CAPS Take 100 mg by mouth daily.      FIBER ADULT GUMMIES PO Take 5 g by mouth 2 (two) times daily.     fluticasone (FLONASE) 50 MCG/ACT nasal spray Place 1 spray into both nostrils daily as needed for allergies. 16 g 12   folic acid (FOLVITE) 400 MCG tablet Take 400 mcg by mouth daily.      Lutein 20 MG CAPS Take 40 mg by mouth daily.     Magnesium 250 MG TABS Take 250 mg by mouth daily.      Multiple Vitamin (MULTIVITAMIN WITH MINERALS)  TABS tablet Take 1 tablet by mouth daily.     Multiple Vitamins-Minerals (PRESERVISION AREDS 2 PO) Take 1 tablet by mouth daily.     Polyethyl Glycol-Propyl Glycol (SYSTANE ULTRA OP) Place 1 drop into both eyes 2 (two) times daily as needed (for dry eyes).      triamcinolone cream (KENALOG) 0.1 % Apply 1 application topically 2 (two) times daily. Mix one to one with gentle moisturizing lotion; as needed 45 g 1   omeprazole (PRILOSEC OTC) 20 MG tablet Take 1 tablet (20 mg total) by mouth daily. (Patient not taking: Reported on 12/05/2020) 45 tablet 1   No current facility-administered medications for this visit.    Allergies as of 12/05/2020 - Review Complete 12/05/2020  Allergen Reaction Noted   Gabapentin Other (See Comments) 05/01/2014   Prednisone Other (See Comments) 05/01/2014   Statins Swelling 04/20/2015   Bee pollen  12/10/2018   Neomycin-bacitracin-polymyxin [bacitracin-neomycin-polymyxin] Other (See Comments) 05/01/2014   Neosporin [neomycin-bacitracin zn-polymyx] Other (See Comments) 05/01/2014   Other  06/27/2019   Pollen extract  12/10/2018   Methotrexate derivatives Other (See Comments) 09/30/2016    ROS:  General: Negative for anorexia, weight loss, fever, chills, fatigue, weakness. ENT: Negative for hoarseness, difficulty swallowing , nasal congestion. CV: Negative for chest pain, angina, palpitations,  dyspnea on exertion, peripheral edema.  Respiratory: Negative for dyspnea at rest, dyspnea on exertion, cough, sputum, wheezing.  GI: See history of present illness. GU:  Negative for dysuria, hematuria, urinary incontinence, urinary frequency, nocturnal urination.  Endo: Negative for unusual weight change.    Physical Examination:   BP 127/71   Pulse 98   Temp 97.6 F (36.4 C) (Oral)   Ht 4' 11.5" (1.511 m)   Wt 128 lb 3.2 oz (58.2 kg)   BMI 25.46 kg/m   General: Well-nourished, well-developed in no acute distress.  Eyes: No icterus. Conjunctivae pink. Mouth:  Oropharyngeal mucosa moist and pink , no lesions erythema or exudate. Neuro: Alert and oriented x 3.  Grossly intact. Skin: Warm and dry, no jaundice.   Psych: Alert and cooperative, normal mood and affect.   Imaging Studies: No results found.  Assessment and Plan:   Yvonne Espinoza is a 80 y.o. y/o female here to follow up for IBS constipation and dysphagia.  S/p EGD and dilation of Schatzki's ring in June 2022.  Weight has been stable.   Plan 1.  Suggest long-term Prilosec 20 mg daily to prevent recurrence of Schatzki's ring 2.  Linzess continue at present dose if no better constipation in future can increase dose further   Dr [name]  MD,MRCP [facility]) Follow up in as needed

## 2021-01-07 ENCOUNTER — Other Ambulatory Visit: Payer: Self-pay | Admitting: Gastroenterology

## 2021-08-12 ENCOUNTER — Telehealth: Payer: Self-pay | Admitting: Gastroenterology

## 2021-08-12 NOTE — Telephone Encounter (Signed)
Patient requesting medical advice, concerned about high numbers on lab results.  ?

## 2021-08-12 NOTE — Telephone Encounter (Signed)
Lvm for pt to return my call regarding increased LFT's.  ?

## 2021-08-15 NOTE — Telephone Encounter (Signed)
Patient states she talk to her PCP and they think it is from her constipation. Told patient we can make her a appointment so made her a appointment for may  ?

## 2021-08-28 ENCOUNTER — Encounter: Payer: Self-pay | Admitting: Nurse Practitioner

## 2021-09-02 ENCOUNTER — Encounter: Payer: Self-pay | Admitting: Gastroenterology

## 2021-09-02 ENCOUNTER — Ambulatory Visit: Payer: Medicare HMO | Admitting: Gastroenterology

## 2021-09-02 VITALS — BP 165/77 | HR 64 | Temp 98.7°F | Ht 60.0 in | Wt 128.0 lb

## 2021-09-02 DIAGNOSIS — R748 Abnormal levels of other serum enzymes: Secondary | ICD-10-CM | POA: Diagnosis not present

## 2021-09-02 DIAGNOSIS — K5900 Constipation, unspecified: Secondary | ICD-10-CM

## 2021-09-02 NOTE — Patient Instructions (Signed)
Please take Linzess 290 mcg one capsule 30 minutes before breakfast. ?

## 2021-09-02 NOTE — Progress Notes (Signed)
?  ?Jonathon Bellows MD, MRCP(U.K) ?[address]  ?[address]  ?[place], [place] [address]  ?Main: [phone]  ?Fax: [phone] ? ? ?Primary Care Physician: [facility], [place] [facility] ? ?Primary Gastroenterologist:  Dr. Jonathon Bellows  ? ?Chief Complaint  ?Patient presents with  ? go over lab results  ? ? ?HPI: Yvonne Espinoza is a 81 y.o. female ? ?Summary of history : ?  ?  ?Seen last at my office on 12/2020 ?  ?  ?11/04/16- EGD-Performed dilation of a schatzkis ring to 18 mm  With features of reflux seen on biopsies of the esophagus.  ?  ?History of chronic constipation/IBS.  Treated with MiraLAX.  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  She has had a colonoscopy she states at that point of time. ?10/02/2020: CT scan of the abdomen pelvis with contrast large colonic stool burden ?09/11/2020:  CRP, CMP,TSH,  CBC- normal  ?10/16/2020: EGD: Schatzki's ring seen at was dilated to 18 mm  ? ?Interval history   12/05/2020-09/02/2021 ? ?Recently LFT's increased .Predominently alk phos at 199. AST/ALT were normal. ? ? ?Since her ER visit has had no abdominal pain chills or fevers.  Completed a course of antibiotics.  She is taking MiraLAX presently on a daily basis.  Does not have adequate bowel movements.  She does not recall trying the Linzess which I gave her previously. ?08/08/2021: Ct abdomen : stool burden throughout the colon : scan performed at [facility] for abdominal pain no other issues. ? ? ?Current Outpatient Medications  ?Medication Sig Dispense Refill  ? aspirin 81 MG EC tablet Take 1 tablet by mouth daily.    ? Cholecalciferol 25 MCG (1000 UT) tablet Take by mouth.    ? Coenzyme Q10 (CO Q 10) 100 MG CAPS Take 100 mg by mouth daily.     ? FIBER ADULT GUMMIES PO Take 5 g by mouth 2 (two) times daily.    ? folic acid (FOLVITE) 384 MCG tablet Take 400 mcg by mouth daily.     ? Lutein 20 MG CAPS Take 40 mg by mouth daily.    ? Magnesium 250 MG TABS Take 250 mg by mouth daily.     ? Multiple Vitamin  (MULTIVITAMIN WITH MINERALS) TABS tablet Take 1 tablet by mouth daily.    ? Multiple Vitamins-Minerals (PRESERVISION AREDS 2 PO) Take 1 tablet by mouth daily.    ? omeprazole (PRILOSEC) 20 MG capsule TAKE 1 CAPSULE BY MOUTH EVERY DAY 90 capsule 3  ? Polyethyl Glycol-Propyl Glycol (SYSTANE ULTRA OP) Place 1 drop into both eyes 2 (two) times daily as needed (for dry eyes).     ? triamcinolone cream (KENALOG) 0.1 % Apply 1 application topically 2 (two) times daily. Mix one to one with gentle moisturizing lotion; as needed (Patient not taking: Reported on 09/02/2021) 45 g 1  ? ?No current facility-administered medications for this visit.  ? ? ?Allergies as of 09/02/2021 - Review Complete 09/02/2021  ?Allergen Reaction Noted  ? Gabapentin Other (See Comments) 05/01/2014  ? Prednisone Other (See Comments) 05/01/2014  ? Statins Swelling 04/20/2015  ? Bee pollen  12/10/2018  ? Neomycin-bacitracin-polymyxin [bacitracin-neomycin-polymyxin] Other (See Comments) 05/01/2014  ? Neosporin [neomycin-bacitracin zn-polymyx] Other (See Comments) 05/01/2014  ? Other  06/27/2019  ? Pollen extract  12/10/2018  ? Methotrexate derivatives Other (See Comments) 09/30/2016  ? ? ?ROS: ? ?General: Negative for anorexia, weight loss, fever, chills, fatigue, weakness. ?ENT: Negative for hoarseness, difficulty swallowing , nasal  congestion. ?CV: Negative for chest pain, angina, palpitations, dyspnea on exertion, peripheral edema.  ?Respiratory: Negative for dyspnea at rest, dyspnea on exertion, cough, sputum, wheezing.  ?GI: See history of present illness. ?GU:  Negative for dysuria, hematuria, urinary incontinence, urinary frequency, nocturnal urination.  ?Endo: Negative for unusual weight change.  ?  ?Physical Examination: ? ? BP (!) 165/77   Pulse 64   Temp 98.7 ?F (37.1 ?C) (Oral)   Ht 5' (1.524 m)   Wt 128 lb (58.1 kg)   BMI 25.00 kg/m?  ? ?General: Well-nourished, well-developed in no acute distress.  ?Eyes: No icterus. Conjunctivae  pink. ?Mouth: Oropharyngeal mucosa moist and pink , no lesions erythema or exudate. ?Neuro: Alert and oriented x 3.  Grossly intact. ?Skin: Warm and dry, no jaundice.   ?Psych: Alert and cooperative, normal mood and affect. ? ? ?Imaging Studies: ?No results found. ? ?Assessment and Plan:  ? ?Jenesys Casseus Stmarie is a 81 y.o. y/o female here to follow up for IBS constipation and dysphagia.  S/p EGD and dilation of Schatzki's ring in June 2022.  Weight has been stable. ?  ?Plan ?1.  Check GGT if normal then alk phos elevation not from liver and could be from bone, if GGT elevated then will need liver work up.  ? ?2.  Continue daily MiraLAX.  I have given her samples of 290 mcg Linzess for 1 week.  Explained to her very clearly that she takes to take it every day.  If it works to call my office and we will give her a 90-day prescription with 3 refills.  If it does not work also she is supposed to call me and let me know so I can give her something different such as Trulance to try. ? ? ?Dr Jonathon Bellows  MD,MRCP [facility]) ?Follow up in as needed ?

## 2021-09-03 LAB — COMPREHENSIVE METABOLIC PANEL
ALT: 19 IU/L (ref 0–32)
AST: 23 IU/L (ref 0–40)
Albumin/Globulin Ratio: 1.8 (ref 1.2–2.2)
Albumin: 4 g/dL (ref 3.7–4.7)
Alkaline Phosphatase: 153 IU/L — ABNORMAL HIGH (ref 44–121)
BUN/Creatinine Ratio: 18 (ref 12–28)
BUN: 14 mg/dL (ref 8–27)
Bilirubin Total: 0.3 mg/dL (ref 0.0–1.2)
CO2: 28 mmol/L (ref 20–29)
Calcium: 9.8 mg/dL (ref 8.7–10.3)
Chloride: 103 mmol/L (ref 96–106)
Creatinine, Ser: 0.78 mg/dL (ref 0.57–1.00)
Globulin, Total: 2.2 g/dL (ref 1.5–4.5)
Glucose: 85 mg/dL (ref 70–99)
Potassium: 4.7 mmol/L (ref 3.5–5.2)
Sodium: 143 mmol/L (ref 134–144)
Total Protein: 6.2 g/dL (ref 6.0–8.5)
eGFR: 77 mL/min/{1.73_m2} (ref >59)

## 2021-09-03 LAB — GAMMA GT: GGT: 68 IU/L — ABNORMAL HIGH (ref 0–60)

## 2021-09-11 ENCOUNTER — Telehealth: Payer: Self-pay | Admitting: Gastroenterology

## 2021-09-11 NOTE — Telephone Encounter (Signed)
Patient called stating that the Linzess that Dr Tobi Bastos gave her worked very well but now she is out of it and has not had a bowel movement in 2 days. Has a few medical questions. Requesting a call back.  ? ? ?

## 2021-09-12 MED ORDER — LINACLOTIDE 290 MCG PO CAPS: 290.0000 ug | ORAL_CAPSULE | Freq: Every day | ORAL | 3 refills | Status: AC

## 2021-09-12 NOTE — Addendum Note (Signed)
Addended by: Adela Ports on: 09/12/2021 10:58 AM ? ? Modules accepted: Orders ? ?

## 2021-09-12 NOTE — Telephone Encounter (Signed)
Called patient and had to leave her a voicemail to call us back.  

## 2021-09-12 NOTE — Telephone Encounter (Signed)
Patient  returned your call. Requesting call back.  

## 2021-09-20 NOTE — Telephone Encounter (Signed)
Called patient back and left her another voicemail to call us back.

## 2021-11-26 NOTE — Progress Notes (Signed)
Inform GGT mildly elevated - would recommend work up - can we order the labs for abnormal LFT's - RUQ USG, follow up after can be video visit

## 2021-12-06 ENCOUNTER — Telehealth: Payer: Self-pay

## 2021-12-06 DIAGNOSIS — R7989 Other specified abnormal findings of blood chemistry: Secondary | ICD-10-CM

## 2021-12-06 NOTE — Telephone Encounter (Signed)
-----   Message from [name], MD sent at 11/26/2021  9:10 AM EDT ----- Inform GGT mildly elevated - would recommend work up - can we order the labs for abnormal LFT's - RUQ USG, follow up after can be video visit

## 2021-12-06 NOTE — Telephone Encounter (Signed)
Called patient but was not able to speak to her but I left her a detailed message. I will mail her ultrasound and follow up appointment with Dr. Tobi Bastos by mail.

## 2021-12-13 ENCOUNTER — Ambulatory Visit
Admission: RE | Admit: 2021-12-13 | Discharge: 2021-12-13 | Disposition: A | Discharge status: Home / Self Care | Payer: Medicare HMO | Source: Ambulatory Visit | Attending: Gastroenterology | Admitting: Gastroenterology

## 2021-12-13 DIAGNOSIS — R7989 Other specified abnormal findings of blood chemistry: Secondary | ICD-10-CM | POA: Diagnosis present

## 2022-01-02 ENCOUNTER — Ambulatory Visit: Payer: Medicare HMO | Admitting: Gastroenterology

## 2022-01-02 DIAGNOSIS — K5900 Constipation, unspecified: Secondary | ICD-10-CM

## 2022-01-02 DIAGNOSIS — R748 Abnormal levels of other serum enzymes: Secondary | ICD-10-CM

## 2022-01-02 NOTE — Progress Notes (Signed)
[name] MD, MRCP(U.K) [address]  [address]  [place], [place] [address]  Main: [phone]  Fax: [phone]   Primary Care Physician: [facility]  Primary Gastroenterologist:  Dr. [name]   Chief Complaint  Patient presents with   Elevated LFTs    HPI: Yvonne Espinoza is a 81 y.o. female Summary of history :     Seen last at my office on 09/02/2021. History of chronic constipation/IBS.  Treated with MiraLAX.  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  She has had a colonoscopy she states at that point of time.    10/02/2020: CT scan of the abdomen pelvis with contrast large colonic stool burden 09/11/2020:  CRP, CMP,TSH,  CBC- normal  10/16/2020: EGD: Schatzki's ring seen at was dilated to 18 mm  08/08/2021: Ct abdomen : stool burden throughout the colon : scan performed at [facility] for abdominal pain no other issues.    Interval history 09/02/2021-01/02/2022  Given samples of Linzess 290 mcg at her last visit which worked very well. After last visit the GGT was mildly elevated recommended full liver work-up with ultrasound and autoimmune labs.  Labs were not done.  Repeat CMP on 11/29/2021 was completely normal.  Right upper quadrant ultrasound on 12/13/2021 showed features of steatosis but no cholelithiasis or any other abnormality No new GI complaints.  Cannot afford the Linzess as it is $300 been taking senna 2 tablets a day along with MiraLAX as needed and is doing okay  Current Outpatient Medications  Medication Sig Dispense Refill   aspirin 81 MG EC tablet Take 1 tablet by mouth daily.     Cholecalciferol 25 MCG (1000 UT) tablet Take by mouth.     Coenzyme Q10 (CO Q 10) 100 MG CAPS Take 100 mg by mouth daily.      FIBER ADULT GUMMIES PO Take 5 g by mouth 2 (two) times daily.     folic acid (FOLVITE) 400 MCG tablet Take 400 mcg by mouth daily.      linaclotide (LINZESS) 290 MCG CAPS capsule Take 1 capsule (290 mcg total)  by mouth daily before breakfast. 90 capsule 3   Lutein 20 MG CAPS Take 40 mg by mouth daily.     Magnesium 250 MG TABS Take 250 mg by mouth daily.      Multiple Vitamin (MULTIVITAMIN WITH MINERALS) TABS tablet Take 1 tablet by mouth daily.     Multiple Vitamins-Minerals (PRESERVISION AREDS 2 PO) Take 1 tablet by mouth daily.     omeprazole (PRILOSEC) 20 MG capsule TAKE 1 CAPSULE BY MOUTH EVERY DAY 90 capsule 3   Polyethyl Glycol-Propyl Glycol (SYSTANE ULTRA OP) Place 1 drop into both eyes 2 (two) times daily as needed (for dry eyes).      triamcinolone cream (KENALOG) 0.1 % Apply 1 application topically 2 (two) times daily. Mix one to one with gentle moisturizing lotion; as needed (Patient not taking: Reported on 09/02/2021) 45 g 1   No current facility-administered medications for this visit.    Allergies as of 01/02/2022 - Review Complete 09/02/2021  Allergen Reaction Noted   Gabapentin Other (See Comments) 05/01/2014   Prednisone Other (See Comments) 05/01/2014   Statins Swelling 04/20/2015   Bee pollen  12/10/2018   Neomycin-bacitracin-polymyxin [bacitracin-neomycin-polymyxin] Other (See Comments) 05/01/2014   Neosporin [neomycin-bacitracin zn-polymyx] Other (See Comments) 05/01/2014   Other  06/27/2019   Pollen extract  12/10/2018   Methotrexate derivatives Other (See Comments) 09/30/2016  ROS:  General: Negative for anorexia, weight loss, fever, chills, fatigue, weakness. ENT: Negative for hoarseness, difficulty swallowing , nasal congestion. CV: Negative for chest pain, angina, palpitations, dyspnea on exertion, peripheral edema.  Respiratory: Negative for dyspnea at rest, dyspnea on exertion, cough, sputum, wheezing.  GI: See history of present illness. GU:  Negative for dysuria, hematuria, urinary incontinence, urinary frequency, nocturnal urination.  Endo: Negative for unusual weight change.    Physical Examination:   There were no vitals taken for this  visit.  General: Well-nourished, well-developed in no acute distress.  Eyes: No icterus. Conjunctivae pink. Neuro: Alert and oriented x 3.  Grossly intact. Skin: Warm and dry, no jaundice.   Psych: Alert and cooperative, normal mood and affect.   Imaging Studies: US Abdomen Limited RUQ (LIVER/GB)  Result Date: 12/13/2021 CLINICAL DATA:  Elevated LFTs EXAM: ULTRASOUND ABDOMEN LIMITED RIGHT UPPER QUADRANT COMPARISON:  CT abdomen pelvis 10/02/2020 FINDINGS: Gallbladder: No gallstones or wall thickening visualized. No sonographic Murphy sign noted by sonographer. Common bile duct: Diameter: 4 mm Liver: Increased echogenicity. No focal lesion. Portal vein is patent on color Doppler imaging with normal direction of blood flow towards the liver. Other: None. IMPRESSION: Increased hepatic parenchymal echogenicity suggestive of steatosis. No cholelithiasis or sonographic evidence for acute cholecystitis. Electronically Signed   By: Annia Belt M.D.   On: 12/13/2021 12:56    Assessment and Plan:   Yvonne Espinoza is a 81 y.o. y/o female  here to follow up for IBS constipation and dysphagia.  S/p EGD and dilation of Schatzki's ring in June 2022.  Weight has been stable.  Had some mild elevation of LFTs in May which has resolved spontaneously no abnormality seen on ultrasound except steatosis.   Plan 1.  For constipation continue senna and MiraLAX combination as she cannot afford Linzess which is $300 per prescription. 2.  LFTs have normalized no further evaluation required  Dr [name]  MD,MRCP [facility]) Follow up in as needed

## 2022-06-20 ENCOUNTER — Other Ambulatory Visit: Payer: Self-pay | Admitting: Gastroenterology

## 2023-07-06 ENCOUNTER — Telehealth: Payer: Self-pay

## 2023-07-06 NOTE — Telephone Encounter (Signed)
 The patient called in to speak with Dr. Tobi Bastos nurse about an appointment.

## 2023-07-06 NOTE — Telephone Encounter (Signed)
 Patient called stating that for the past month she's been having abdominal pain and since she has a history of diverticulitis, her PCP recommended for her to give [place] a call. Patient wants to know if she could be treated. Please advise.

## 2023-07-06 NOTE — Telephone Encounter (Signed)
 Called patient back to let her know what Dr. Tobi Bastos had stated below and she did not agree with him. She tried to explain to me that she needed to be seen as soon as possible. I let her know that unfortunately Dr. Tobi Bastos had no available appointments until mid April and she refused as she stated that she needed to be seen now and then hung up.
